# Patient Record
Sex: Female | Born: 1935 | Race: White | Hispanic: No | Smoking: Former smoker
Health system: Southern US, Community
[De-identification: ages and names within clinical notes are randomized; demographics above are authoritative.]

## PROBLEM LIST (undated history)

## (undated) DIAGNOSIS — T82330A Leakage of aortic (bifurcation) graft (replacement), initial encounter: Secondary | ICD-10-CM

## (undated) DIAGNOSIS — M1990 Unspecified osteoarthritis, unspecified site: Secondary | ICD-10-CM

## (undated) DIAGNOSIS — I714 Abdominal aortic aneurysm, without rupture, unspecified: Secondary | ICD-10-CM

## (undated) DIAGNOSIS — B029 Zoster without complications: Secondary | ICD-10-CM

## (undated) DIAGNOSIS — M353 Polymyalgia rheumatica: Secondary | ICD-10-CM

## (undated) DIAGNOSIS — E039 Hypothyroidism, unspecified: Secondary | ICD-10-CM

## (undated) DIAGNOSIS — I6529 Occlusion and stenosis of unspecified carotid artery: Secondary | ICD-10-CM

## (undated) DIAGNOSIS — N814 Uterovaginal prolapse, unspecified: Secondary | ICD-10-CM

## (undated) DIAGNOSIS — K642 Third degree hemorrhoids: Secondary | ICD-10-CM

## (undated) DIAGNOSIS — Z974 Presence of external hearing-aid: Secondary | ICD-10-CM

## (undated) DIAGNOSIS — N8110 Cystocele, unspecified: Secondary | ICD-10-CM

## (undated) DIAGNOSIS — D649 Anemia, unspecified: Secondary | ICD-10-CM

## (undated) DIAGNOSIS — I219 Acute myocardial infarction, unspecified: Secondary | ICD-10-CM

## (undated) HISTORY — PX: HEMORRHOID BANDING: SHX5850

## (undated) HISTORY — DX: Third degree hemorrhoids: K64.2

## (undated) HISTORY — PX: APPENDECTOMY: SHX54

## (undated) HISTORY — DX: Polymyalgia rheumatica: M35.3

## (undated) HISTORY — DX: Zoster without complications: B02.9

## (undated) HISTORY — DX: Cystocele, unspecified: N81.10

## (undated) HISTORY — PX: CARPAL TUNNEL RELEASE: SHX101

## (undated) HISTORY — DX: Unspecified osteoarthritis, unspecified site: M19.90

## (undated) HISTORY — DX: Acute myocardial infarction, unspecified: I21.9

## (undated) HISTORY — DX: Uterovaginal prolapse, unspecified: N81.4

## (undated) HISTORY — PX: CATARACT EXTRACTION: SUR2

## (undated) HISTORY — PX: TONSILLECTOMY: SUR1361

## (undated) HISTORY — DX: Hypothyroidism, unspecified: E03.9

## (undated) HISTORY — DX: Anemia, unspecified: D64.9

## (undated) HISTORY — DX: Abdominal aortic aneurysm, without rupture, unspecified: I71.40

## (undated) HISTORY — DX: Abdominal aortic aneurysm, without rupture: I71.4

## (undated) HISTORY — PX: CHOLECYSTECTOMY: SHX55

## (undated) HISTORY — DX: Occlusion and stenosis of unspecified carotid artery: I65.29

---

## 1980-08-22 DIAGNOSIS — I219 Acute myocardial infarction, unspecified: Secondary | ICD-10-CM

## 1980-08-22 HISTORY — DX: Acute myocardial infarction, unspecified: I21.9

## 2006-08-22 HISTORY — PX: ABDOMINAL AORTIC ANEURYSM REPAIR: SUR1152

## 2006-09-18 ENCOUNTER — Encounter: Admission: RE | Admit: 2006-09-18 | Discharge: 2006-09-18 | Payer: Self-pay

## 2006-10-06 ENCOUNTER — Ambulatory Visit: Payer: Self-pay | Admitting: Vascular Surgery

## 2006-10-11 ENCOUNTER — Inpatient Hospital Stay (HOSPITAL_COMMUNITY): Admission: RE | Admit: 2006-10-11 | Discharge: 2006-10-13 | Payer: Self-pay | Admitting: Vascular Surgery

## 2006-10-12 ENCOUNTER — Ambulatory Visit: Payer: Self-pay | Admitting: Vascular Surgery

## 2006-10-30 ENCOUNTER — Encounter: Admission: RE | Admit: 2006-10-30 | Discharge: 2006-10-30 | Payer: Self-pay | Admitting: Vascular Surgery

## 2006-10-30 ENCOUNTER — Ambulatory Visit: Payer: Self-pay | Admitting: Vascular Surgery

## 2006-12-07 ENCOUNTER — Ambulatory Visit: Payer: Self-pay | Admitting: Vascular Surgery

## 2006-12-07 ENCOUNTER — Encounter (INDEPENDENT_AMBULATORY_CARE_PROVIDER_SITE_OTHER): Payer: Self-pay | Admitting: *Deleted

## 2006-12-07 ENCOUNTER — Inpatient Hospital Stay (HOSPITAL_COMMUNITY): Admission: RE | Admit: 2006-12-07 | Discharge: 2006-12-09 | Payer: Self-pay | Admitting: Vascular Surgery

## 2006-12-07 HISTORY — PX: CAROTID ENDARTERECTOMY: SUR193

## 2006-12-25 ENCOUNTER — Ambulatory Visit: Payer: Self-pay | Admitting: Vascular Surgery

## 2007-05-25 ENCOUNTER — Encounter: Admission: RE | Admit: 2007-05-25 | Discharge: 2007-05-25 | Payer: Self-pay | Admitting: Vascular Surgery

## 2007-05-25 ENCOUNTER — Ambulatory Visit: Payer: Self-pay | Admitting: Vascular Surgery

## 2007-06-20 ENCOUNTER — Ambulatory Visit: Payer: Self-pay | Admitting: Ophthalmology

## 2007-08-06 ENCOUNTER — Ambulatory Visit: Payer: Self-pay | Admitting: Ophthalmology

## 2008-01-02 ENCOUNTER — Encounter: Admission: RE | Admit: 2008-01-02 | Discharge: 2008-01-02 | Payer: Self-pay | Admitting: Orthopedic Surgery

## 2008-01-03 ENCOUNTER — Ambulatory Visit (HOSPITAL_BASED_OUTPATIENT_CLINIC_OR_DEPARTMENT_OTHER): Admission: RE | Admit: 2008-01-03 | Discharge: 2008-01-03 | Payer: Self-pay | Admitting: Orthopedic Surgery

## 2008-01-18 ENCOUNTER — Ambulatory Visit: Payer: Self-pay | Admitting: Vascular Surgery

## 2008-07-25 ENCOUNTER — Ambulatory Visit: Payer: Self-pay | Admitting: Vascular Surgery

## 2008-08-29 ENCOUNTER — Ambulatory Visit: Payer: Self-pay | Admitting: Vascular Surgery

## 2008-08-29 ENCOUNTER — Encounter: Admission: RE | Admit: 2008-08-29 | Discharge: 2008-08-29 | Payer: Self-pay | Admitting: Vascular Surgery

## 2008-10-29 ENCOUNTER — Ambulatory Visit: Payer: Self-pay | Admitting: Vascular Surgery

## 2008-12-26 ENCOUNTER — Ambulatory Visit: Payer: Self-pay | Admitting: Vascular Surgery

## 2009-09-04 ENCOUNTER — Ambulatory Visit: Payer: Self-pay | Admitting: Vascular Surgery

## 2009-09-04 ENCOUNTER — Encounter: Admission: RE | Admit: 2009-09-04 | Discharge: 2009-09-04 | Payer: Self-pay | Admitting: Vascular Surgery

## 2010-01-08 ENCOUNTER — Ambulatory Visit: Payer: Self-pay | Admitting: Vascular Surgery

## 2010-09-07 ENCOUNTER — Ambulatory Visit: Admit: 2010-09-07 | Payer: Self-pay | Admitting: Vascular Surgery

## 2010-09-07 ENCOUNTER — Ambulatory Visit
Admission: RE | Admit: 2010-09-07 | Discharge: 2010-09-07 | Payer: Self-pay | Source: Home / Self Care | Attending: Vascular Surgery | Admitting: Vascular Surgery

## 2010-09-17 NOTE — Procedures (Unsigned)
VASCULAR LAB EXAM  INDICATION:  Follow up AAA endograft placed 10/11/2006.  HISTORY: Diabetes:  No. Cardiac:  CABG. Hypertension:  No.  EXAM:  AAA sac size 4.3 cm AP.  Transverse 4.2 cm.  Previous sac size was 4.2 cm by CT.  IMPRESSION:  The aorta and endograft appear patent.  No significant change in the size of the aneurysmal sac surrounding the endograft.  No evidence of an endoleak was detected.  ___________________________________________ Larina Earthly, M.D.  OD/MEDQ  D:  09/07/2010  T:  09/07/2010  Job:  [id number]

## 2010-09-17 NOTE — Procedures (Unsigned)
CAROTID DUPLEX EXAM  INDICATION:  Follow up carotid artery disease.  HISTORY: Diabetes:  No Cardiac:  CABG in 1982 Hypertension:  No Smoking:  No Previous Surgery:  Left carotid endarterectomy with a Dacron patch angioplasty 12/07/2006. CV History:  The patient is currently asymptomatic. Amaurosis Fugax No, Paresthesias No, Hemiparesis No                                      RIGHT             LEFT Brachial systolic pressure:         122               118 Brachial Doppler waveforms:         WNL               WNL Vertebral direction of flow:        antegrade         bi-directional DUPLEX VELOCITIES (cm/sec) CCA peak systolic                   76                85 ECA peak systolic                   78                75 ICA peak systolic                   131               92 ICA end diastolic                   41                31 PLAQUE MORPHOLOGY:                  heterogeneous PLAQUE AMOUNT:                      moderate PLAQUE LOCATION:                    ICA  IMPRESSION:  Right-sided 40% to 59% stenosis.  This could be secondary to vessel tortuosity.  A patent and durable left carotid endarterectomy with no hemodynamically-significant stenosis.  The left vertebral is bi- directional, suggestive of a partial subclavian steal.    ___________________________________________ Larina Earthly, M.D.  OD/MEDQ  D:  09/07/2010  T:  09/07/2010  Job:  [id number]

## 2011-01-04 NOTE — Assessment & Plan Note (Signed)
OFFICE VISIT   BLONDINA, CODERRE  DOB:  11/17/1934                                       05/25/2007  YNWGN#:[id number]   Patient presents today for followup of her left carotid endarterectomy  in April, 2008, and her aortic aneurysm stent grafting in February,  2008.  She reports no major new difficulties, specifically no cardiac  difficulties.  She has had no neurologic deficits.   PHYSICAL EXAMINATION:  She is a well-developed white female appearing  her stated age of 75.  Blood pressure is 142/85.  Pulse 83, respirations  18.  Her left carotid incision has somewhat hypertrophic scars.  She has  no bruits bilaterally.  Her abdominal exam reveals mild obesity with no  palpable aneurysm.  She has 2+ femoral pulses bilaterally.   She underwent repeat duplex today, and this reveals no evidence of  stenosis in the left or right carotid system.   She also underwent a CT scan of her abdomen today for followup of her  stent graft.  This shows excellent positioning of her stent graft with  no evidence of endo leak and actually has had a shrinking of her  aneurysm sac from 5.5 cm down to 4.8 cm.   I am quite pleased with her early results.  She does have more than the  usual amount of discomfort around the area of her left neck incision and  will continue to treat this  symptomatically with Motrin.  I plan to see her again in one year with  repeat carotid duplex and repeat CT scan of her stent graft repair.   Larina Earthly, M.D.  Electronically Signed   TFE/MEDQ  D:  05/25/2007  T:  05/28/2007  Job:  [id number]   cc:   Wallace Cullens

## 2011-01-04 NOTE — Assessment & Plan Note (Signed)
OFFICE VISIT   Margaret, Castillo  DOB:  20-Jun-1935                                       08/29/2008  ZOXWR#:[id number]   Patient presents today for followup of her stent graft repair of  abdominal aortic aneurysm and also a followup of her carotid disease.  She continues to do quite well with no new major medical difficulties.  Specifically, she denies any cardiac problems.  She does have  difficulties with elevated cholesterol, hypothyroidism, and polymyalgia  rheumatica.  She has had a myocardial infarction in 1982.  She is  married and is retired.  She does not smoke or drink alcohol.   MEDICATION ALLERGIES:  Penicillin, codeine.   CURRENT MEDICATIONS:  1. Synthroid.  2. Prednisone.  3. Vytorin.  4. Tricor.   PHYSICAL EXAMINATION:  She is a moderately obese white female in no  acute distress.  Blood pressure 138/82, pulse 99, respirations 18.  Her  radial pulses are 2+ bilaterally.  She does have 2+ posterior tibial  pulses.  Abdominal exam reveals obesity.  I do not show any aneurysms.   She underwent a CAT scan today.  I reviewed this with her.  This shows  excellent positioning of her stent graft with no evidence of shifting.  She actually is shrinking in the size of her overall aneurysm sac to  just less than 4 cm.  I am quite pleased with her overall result, as is  patient.  We will see her again in 1 year with repeat CT of her abdomen  for followup of her stent graft.   She has also done well from the standpoint of her endarterectomy on the  left.  She does continue to have some soreness at the level of the  incision but does not have any bruits and is scheduled for routine  follow-up ultrasound in our office.  She will notify us should she  develop any new difficulty.  We will see her again with the ultrasound  carotid and with CT in one year.   Larina Earthly, M.D.  Electronically Signed   TFE/MEDQ  D:  08/29/2008  T:  09/01/2008  Job:   [id number]   cc:   Wallace Cullens

## 2011-01-04 NOTE — Procedures (Signed)
CAROTID DUPLEX EXAM   INDICATION:  Followup evaluation of known carotid artery disease.   HISTORY:  Diabetes:  No.  Cardiac:  History of MI.  Hypertension:  No.  Smoking:  Patient is a former smoker.  Previous Surgery:  Left carotid endarterectomy with Dacron patch  angioplasty by Dr. Arbie Cookey on 12/07/2006.  Stent graft repair of abdominal  aortic aneurysm on 10/11/2006.  CV History:  Patient reports no cerebrovascular symptoms at this time.  Previous duplex performed on 05/25/2007 revealed a 20 to 39% right ICA  stenosis and a 40% to 59% left ICA stenosis.  Amaurosis Fugax No, Paresthesias No, Hemiparesis No                                       RIGHT             LEFT  Brachial systolic pressure:         132               138  Brachial Doppler waveforms:         Triphasic         Triphasic  Vertebral direction of flow:        Antegrade         Antegrade  DUPLEX VELOCITIES (cm/sec)  CCA peak systolic                   91                121  ECA peak systolic                   124               156  ICA peak systolic                   81                136  ICA end diastolic                   30                28  PLAQUE MORPHOLOGY:                  Soft              Soft  PLAQUE AMOUNT:                      Mild to moderate  Mild  PLAQUE LOCATION:                    Proximal ICA      Proximal ICA   IMPRESSION:  1. Right ICA stenosis 20% to 39%.  2. Left ICA stenosis 40% to 59% status post endarterectomy.  3. No significant change from previous study performed 05/25/2007.   ___________________________________________  Larina Earthly, M.D.   MC/MEDQ  D:  01/18/2008  T:  01/18/2008  Job:  [id number] [id number]

## 2011-01-04 NOTE — Procedures (Signed)
CAROTID DUPLEX EXAM   INDICATION:  Follow up carotid disease.   HISTORY:  Diabetes:  No  Cardiac:  CABG in 1982  Hypertension:  No  Smoking:  No  Previous Surgery:  Left carotid endarterectomy with DPA on 12/07/2006 by  Dr. Arbie Cookey.  AAA graft repair 10/11/2006.  CV History:  Asymptomatic.  Amaurosis Fugax No, Paresthesias No, Hemiparesis No                                       RIGHT             LEFT  Brachial systolic pressure:         120               118  Brachial Doppler waveforms:         triphasic         triphasic  Vertebral direction of flow:        antegrade         antegrade/abnormal  DUPLEX VELOCITIES (cm/sec)  CCA peak systolic                   128               132  ECA peak systolic                   150               164  ICA peak systolic                   126               184 in the distal  ICA end diastolic                   39                66  PLAQUE MORPHOLOGY:                  heterogeneous     homogeneous  PLAQUE AMOUNT:                      mild to moderate  mild to moderate  PLAQUE LOCATION:                    bifurcation and ICA                 ICA   IMPRESSION:  1. Right internal carotid artery Doppler velocities show evidence of      40%-59% stenosis.  2. Left internal carotid artery velocities show evidence of 40%-59%      stenosis; however, Doppler velocities may be higher due to tortuous      vessel.  3. Bilateral vertebral arteries are both antegrade; however, left      shows evidence of early systolic deceleration.   ___________________________________________  Larina Earthly, M.D.   NT/MEDQ  D:  01/08/2010  T:  01/08/2010  Job:  [id number]

## 2011-01-04 NOTE — Procedures (Signed)
CAROTID DUPLEX EXAM   INDICATION:  Followup carotid artery disease.   HISTORY:  Diabetes:  No.  Cardiac:  MI.  Hypertension:  No.  Smoking:  Previous.  Previous Surgery:  Left CEA with DPA 12/07/2006 by Dr. Arbie Cookey.  AAA endo  graft repair 10/11/2006.  CV History:  No.  Amaurosis Fugax No, Paresthesias No, Hemiparesis No                                       RIGHT             LEFT  Brachial systolic pressure:         114               116  Brachial Doppler waveforms:         WNL               WNL  Vertebral direction of flow:        Antegrade         Antegrade -  abnormal  DUPLEX VELOCITIES (cm/sec)  CCA peak systolic                   114               118  ECA peak systolic                   116               146  ICA peak systolic                   99                118  ICA end diastolic                   31                24  PLAQUE MORPHOLOGY:                  Heterogeneous     Homogenous  PLAQUE AMOUNT:                      Mild              Mild/moderate  PLAQUE LOCATION:                    bifurcation/ICA   ICA   IMPRESSION:  1. Right ICA shows evidence of 20-39% stenosis.  2. Left ICA shows evidence of 40-59% stenosis by velocities (low end      of range).  3. Bilateral vertebral arteries appear antegrade, however, left shows      evidence of early systolic deceleration.   ___________________________________________  Larina Earthly, M.D.   AS/MEDQ  D:  12/26/2008  T:  12/26/2008  Job:  [id number]

## 2011-01-04 NOTE — Procedures (Signed)
CAROTID DUPLEX EXAM   INDICATION:  Follow-up evaluation of known carotid artery disease.   HISTORY:  Diabetes:  No.  Cardiac:  MI.  Hypertension:  No.  Smoking:  Patient smoked for 20 years but not currently.  Previous Surgery:  Left carotid artery disease with Dacron patch  angioplasty by Dr. Arbie Cookey on December 07, 2006.  Abdominal aortic aneurysm  repair with a stent graft on October 11, 2006 by Dr. Arbie Cookey.  CV History:  Patient reports no cerebrovascular symptoms at this time.  Amaurosis Fugax No, Paresthesias No, Hemiparesis No                                       RIGHT             LEFT  Brachial systolic pressure:         128               128  Brachial Doppler waveforms:         Triphasic         Triphasic  Vertebral direction of flow:        Antegrade         Antegrade  DUPLEX VELOCITIES (cm/sec)  CCA peak systolic                   94                83  ECA peak systolic                   137               131  ICA peak systolic                   89                114  ICA end diastolic                   32                43  PLAQUE MORPHOLOGY:                  Calcified         Soft  PLAQUE AMOUNT:                      Mild              Mild  PLAQUE LOCATION:                    Proximal ICA      Proximal and  distal to the endarterectomy site   IMPRESSION:  1. A 20-39% right internal carotid artery stenosis.  2. A 40-59% distal internal carotid artery stenosis, status post      endarterectomy.   ___________________________________________  Larina Earthly, M.D.   MC/MEDQ  D:  05/25/2007  T:  05/26/2007  Job:  [id number]

## 2011-01-04 NOTE — Assessment & Plan Note (Signed)
OFFICE VISIT   Margaret Castillo, Margaret Castillo  DOB:  1935-05-13                                       09/04/2009  DGLOV#:[id number]   The patient presents today for followup of her infrarenal aneurysm stent  graft repair with a Gore excluder graft on 10/11/2006.  She was also had  a left carotid endarterectomy on 12/07/2006.  She continues to be quite  active and has no major medical difficulties.  Does have history of  prior myocardial infarction, hypothyroidism and polymyalgia rheumatica.  She does not have any neurologic deficits.   SOCIAL HISTORY:  She is married, retired, with 2 children.  She does not  smoke, having quit in 1982.  She does not drink alcohol on a regular  basis.   PHYSICAL EXAMINATION:  A well-developed, well-nourished white female  appearing stated age.  Blood pressure is 128/81, pulse 84, respirations  18.  Carotid arteries reveal a well-healed left carotid incision with no  masses.  She is grossly intact.  HEENT is normal.  Chest is clear  bilaterally without wheezing.  Abdomen is soft and nontender and no  palpable aneurysm.  Musculoskeletal shows no major deformities or  cyanosis.  Neurologic:  No weakness or paresthesias.  Skin:  Without  rashes or ulceration.   She underwent a CT angiogram today for followup of her stent graft.  This shows continued decrease in size from a maximum of 5.5 down to 4.2  cm.  Her stent graft is well-positioned.   I have reassured the patient with this.  We plan to see her again in 1  year with a carotid ultrasound and ultrasound of her aortic aneurysm  stent graft repair.     Larina Earthly, M.D.  Electronically Signed   TFE/MEDQ  D:  09/04/2009  T:  09/07/2009  Job:  [id number]   cc:   Wallace Cullens

## 2011-01-04 NOTE — Op Note (Signed)
[name], Margaret Castillo               ACCOUNT NO.:  [id number]   MEDICAL RECORD NO.:  [id number]          PATIENT TYPE:  AMB   LOCATION:  DSC                          FACILITY:  [facility]   PHYSICIAN:  Cindee Salt, M.D.       DATE OF BIRTH:  [birth year]   DATE OF PROCEDURE:  01/03/2008  DATE OF DISCHARGE:                               OPERATIVE REPORT   PREOPERATIVE DIAGNOSES:  1. Carpal tunnel syndrome, left hand.  2. Stenosing tenosynovitis, left thumb.   POSTOPERATIVE DIAGNOSES:  1. Carpal tunnel syndrome, left hand.  2. Stenosing tenosynovitis, left thumb.   OPERATIONS:  1. Decompression left median nerve.  2. Release of the A1 pulley, left thumb.   ASSISTANT:  Carolyne Fiscal, RN.   ANESTHESIA:  General.   ANESTHESIOLOGIST:  Janetta Hora. Gelene Mink, M.D.   HISTORY:  The patient is a 76 year old female with a history of carpal  tunnel syndrome, triggering of the left thumb, neither of these have  responded to conservative treatment.  She has a history of thyroid  problems and is on prednisone.  She is aware of risks and complications  including infection recurrence; injury to arteries, nerves, or tendons;  incomplete relief of symptoms, and dystrophy.  Questions have been  encouraged and answered.  She has elected to proceed with decompression.  She is seen in the preoperative area.  The patient again was encouraged  to ask questions and these were answered to her satisfaction.  The  extremity was marked by both the patient and the surgeon.  Antibiotic  given.   PROCEDURE:  The patient was brought to the operating room where a  general anesthetic was carried out without difficulty.  She was prepped  using DuraPrep in supine position with the left arm free.  After time-  out was performed, a longitudinal incision was made in the palm and  carried down through the subcutaneous tissue.  Bleeders were  electrocauterized.  Palmar fascia was split.  Superficial palmar arch  was identified.   Flexor tendon, ring, little finger identified to the  ulnar side of median nerve and carpal retinaculum was incised with sharp  dissection.  A right-angle and Sewall retractor were placed between skin  and forearm fascia.  The fascia was released for approximately a  centimeter and a half proximal to the wrist crease under direct vision.  Canal was explored.  The compression of the nerve was apparent with an  hourglass deformity.  The wound was irrigated.  The skin closed with  interrupted 5-0 Vicryl Rapide sutures.  A separate incision was then  made transversely over the metacarpophalangeal joint of the thumb and  carried down to subcutaneous tissue.  The neurovascular structures were  identified, protected, and retractors placed.  A1 pulley was found to be  markedly thickened with a large nodule present just proximal to the FPL  tendon.  A release was then performed on the radial side protecting the  oblique ligament.  The thumb displayed full range of motion.  No further  trigger was noted.  The wound was irrigated.  The skin closed  with  interrupted 5-0 Vicryl Rapide sutures.  Sterile compressive dressing  with splint to the wrist applied with fingers and thumb free.  The patient tolerated the procedure well and was taken to the recovery  room for observation in satisfactory condition.  Deflation of the  tourniquet was resumed immediately.  She will be discharged home to  return to the [facility] [place].           ______________________________  Cindee Salt, M.D.     GK/MEDQ  D:  01/03/2008  T:  01/04/2008  Job:  [id number]   cc:   Wallace Cullens

## 2011-01-04 NOTE — Assessment & Plan Note (Signed)
OFFICE VISIT   Margaret Castillo, Margaret Castillo  DOB:  21-Jun-1935                                       10/29/2008  ZOXWR#:[id number]   The patient presents today for continued followup regarding her  abdominal aortic aneurysm repair with a stent graft.  She had this done  in February of 2008 and I had recently seen her in January with CT  followup showing continued shrinkage in the size of her infrarenal  aneurysm and good positioning of her stent graft.  She did have a small  type 2 endoleak at the distal portion which we had discussed at that  time.  She had flank discomfort.  She thought she may have had recurrent  kidney stone and presented to [facility] emergency department on  03/08.  Due to the history of her stent graft she underwent a CT scan at  that time.  Apparently it was quite very busy in the emergency  department and she was there from early evening well through the early  morning hours of the next day.  There was some discussion regarding the  stent graft and therefore she had the CT scan.  She was told that this  showed an area of concern and on the dictated report of this she was  felt to have a potential type 2 endoleak and also the dictated report  says that her aneurysm size was 4.2 x 4.5 x 9.8.  Apparently the  resident in the emergency department discussed this with the vascular  surgeon and was told to see me.  Her pain is subsequently completely  resolved.  I do have her actual CT scan on disk and I have compared this  with her January of 2010 study.  This shows no change whatsoever.  Her  maximal diameter continues to be small at just over 4 cm and she does  continue to have a small probable type 2 endoleak at the level of the  iliac bifurcation.  I discussed this with the patient and her sister and  explained that there is no concern regarding this and that we will  continue her followup CT scan as described and that they may have been  talking about length but certainly she does not have any increase in  size in her native aortic sac.  We will see her again in 1 year.  She  knows to present immediately should she have any difficulties.   Larina Earthly, M.D.  Electronically Signed   TFE/MEDQ  D:  10/29/2008  T:  10/30/2008  Job:  [id number]   cc:   Wallace Cullens

## 2011-01-07 NOTE — Op Note (Signed)
Margaret Castillo, Margaret Castillo               ACCOUNT NO.:  [id number]   MEDICAL RECORD NO.:  [id number]          PATIENT TYPE:  INP   LOCATION:  3316                         FACILITY:  [facility]   PHYSICIAN:  Quita Skye. Hart Rochester, M.D.  DATE OF BIRTH:  06-11-1935   DATE OF PROCEDURE:  10/11/2006  DATE OF DISCHARGE:                               OPERATIVE REPORT   PREOPERATIVE DIAGNOSIS:  Infrarenal abdominal aortic aneurysm.   PROCEDURE:  Insertion of an aortic stent graft which is performed by Dr.  Arbie Cookey.  I am dictating the left femoral cutdown, so the procedure for  this dictation is a left femoral artery cutdown exposure for insertion  of aortic stent graft.   PROCEDURE:  The patient was taken to the operating room, placed in  supine position at which time the abdomen and groins were prepped with  Betadine scrub solution and draped in routine sterile manner.  Dr.  Hart Rochester exposed the left femoral vessels, Dr. Arbie Cookey exposed the right  femoral vessels.  I made an oblique incision just above the inguinal  crease, carried down through subcutaneous tissue.  The femoral vessels  were exposed from the inguinal ligament down to the origin of both  superficial femoral and profunda femoris arteries.  There was posterior  plaque present but there was an excellent pulse anteriorly with a soft  vessel.  Following completion of the insertion of the aortic stent  graft, to be dictated by Dr. Arbie Cookey, the femoral artery was repaired with  two continuous 6-0 Prolene sutures after appropriate back bleeding.  Adequate hemostasis was achieved.  The wound was closed with Vicryl in  subcuticular fashion with Steri-Strips.  Sterile dressing applied.  The  patient taken to recovery in satisfactory condition.           ______________________________  Quita Skye Hart Rochester, M.D.     JDL/MEDQ  D:  10/11/2006  T:  10/12/2006  Job:  [id number]

## 2011-01-07 NOTE — H&P (Signed)
NAME:  Margaret Castillo, Margaret Castillo               ACCOUNT NO.:  [id number]   MEDICAL RECORD NO.:  [id number]          PATIENT TYPE:  INP   LOCATION:  NA                           FACILITY:  [facility]   PHYSICIAN:  Larina Earthly, M.D.    DATE OF BIRTH:  Aug 19, 1935   DATE OF ADMISSION:  12/07/2006  DATE OF DISCHARGE:                              HISTORY & PHYSICAL   PRIMARY CARE PHYSICIAN:  Wallace Cullens, M.D.   CHIEF COMPLAINT:  Left carotid stenosis.   HISTORY OF PRESENT ILLNESS:  Margaret Castillo is a 76 year old Caucasian  female status post abdominal aortic aneurysm repair using a Gore  Excluder aortic stent graft on October 11, 2006, by Dr. Gretta Began.  She did well postoperatively and was discharged home on postoperative  day #2.  She had a followup CT scan on March 10 showing excellent  position of the stent graft and no evidence of leak.  She is at her  usual level of activity with scheduled 59-month followup.  However, she  was underwent preoperative Doppler evaluation preceding her aortic  surgery and was found to have severe asymptomatic left internal carotid  artery stenosis at 80-99%.  There was only there was only 20-39% on the  right.  Based on the severity of her stenosis, Dr. Arbie Cookey recommended  elective repair to reduce her risk for future stroke.  After discussing  risks and benefits, she agreed to proceed.  However, she chose to  postpone surgery by approximately 1 month as her husband was undergoing  prostate surgery in late March.  Today, December 05, 2006,  she is at our  office for preoperative History and Physical.  She continues to be  asymptomatic.  Specifically denied headache, dizziness, seizures,  numbness, muscle weakness, dysarthria, dysphagia, visual changes or  amaurosis fugax.  She does have mild symmetrical tingling in her feet  which has been present for several months, and she has had normal lower  extremity Doppler studies.  She has also the had syncope in the past  after  an episode of vomiting when she was sick with viral  gastroenteritis.  This was over 1 year ago.  She has had no prior  history of TIA or stroke.   PAST MEDICAL HISTORY:  1. Abdominal aortic aneurysm status post stent graft repair.  2. Hypertension.  3. Coronary artery disease status post myocardial infarction in 1982      (normal adenosine  stress test around February of this year).  4. Hyperlipidemia.  5. Hypothyroidism.  6. Polymyalgia rheumatica.  7. Nephrolithiasis.  8. Hemorrhoids with negative colonoscopy 2 years ago.   PAST SURGICAL HISTORY:  1. Open cholecystectomy.  2. Tonsillectomy.  3. Exploratory laparotomy for fertility workup.  4. History of cystoscopy.  5. History of D&C status post miscarriage.   ALLERGIES:  1. PENICILLIN causes a rash.  2. CODEINE causes nausea or vomiting.  3. She is also intolerant to several STATINS.   MEDICATIONS:  1. Synthroid 112 mcg daily.  2. Prednisone 1 mg daily.  3. Vytorin 10/80 mg daily.  4. TriCor 140 mg daily.  5. Fosamax 70 mg weekly.  6. Calcium supplement daily.   REVIEW OF SYSTEMS:  See HPI for pertinent positives and negatives. In  addition, she reports mild dyspnea on exertion but otherwise no  shortness of breath.  She denies chest pain.  She has occasional leg  cramps at night and occasional rectal bleeding secondary to hemorrhoids.   SOCIAL HISTORY:  She is married with two children, one biological and  one adopted child.  She quit smoking in 1982.  She does not drink  alcohol. She works as a Museum/gallery exhibitions officer at [employer].  She [place]   FAMILY HISTORY:  Mother is deceased, 25, with a history of  cerebrovascular accident.  Father deceased at 50 with history of  myocardial infarction.   PHYSICAL EXAMINATION:  VITAL SIGNS:  Blood pressure 112/70 in her left  arm, heart rate 76, respirations 20.  GENERAL APPEARANCE:  This 76 year old pleasant Caucasian  female who is  alert, cooperative in no acute distress.  HEENT: Head is normocephalic, atraumatic.  She does wear glasses.  Pupils are equal and reactive to light accommodation.  Sclera  nonicteric. Oral mucosa is pink and moist. No lesions were noted  NECK:  Her neck is supple with palpable carotid pulses.  She does have a  harsh bruit on the left.  No lymphadenopathy was appreciated.  RESPIRATORY:  Lung sounds are clear, symmetrical on inspiration, and  breathing was unlabored  CARDIAC:  Heart has a regular rate and rhythm.  No murmur, rub or gallop  was noted.  ABDOMEN:  Abdomen is soft, nontender, nondistended.  No hepatomegaly was  appreciated.  Abdomen is a some somewhat obese.  She does have a well-  healed right upper quadrant scar.  Bowel sounds normoactive.  GU/RECTAL:  These exams were deferred.  EXTREMITIES: Extremities are warm and dry without pitting edema.  No  ulcerations were noted.  She has 2+ radial, dorsalis pedis and posterior  tibial pulses.  Her bilateral groin incisions appear to be healing well  without signs of infection.  NEUROLOGIC:  Neurologic exam is grossly intact.  She is alert and  oriented x4.  Speech is clear.  Gait is steady.  Muscle strength is 5/5  in upper and lower extremities.  Deep tendon reflexes were about 2+.   ASSESSMENT:  Severe left internal carotid artery stenosis, asymptomatic.   PLAN:  She will be electively admitted to [facility] on December 07, 2006, to undergo a left carotid endarterectomy by Dr. Gretta Began.      Jerold Coombe, P.A.      Larina Earthly, M.D.  Electronically Signed    AWZ/MEDQ  D:  12/05/2006  T:  12/05/2006  Job:  [id number]   cc:   Wallace Cullens

## 2011-01-07 NOTE — Op Note (Signed)
NAMESHOSHANAH, DAPPER               ACCOUNT NO.:  [id number]   MEDICAL RECORD NO.:  [id number]          PATIENT TYPE:  INP   LOCATION:  3316                         FACILITY:  [facility]   PHYSICIAN:  Larina Earthly, M.D.    DATE OF BIRTH:  06-30-1935   DATE OF PROCEDURE:  10/11/2006  DATE OF DISCHARGE:                               OPERATIVE REPORT   PREOPERATIVE DIAGNOSIS:  Infrarenal abdominal aortic aneurysm.   POSTOPERATIVE DIAGNOSIS:  Infrarenal abdominal aortic aneurysm.   PROCEDURE:  Stent graft, Gore Excluder repair of infrarenal abdominal  aortic aneurysm.   SURGEON:  Larina Earthly, M.D.   ASSESSMENT:  Quita Skye. Hart Rochester, M.D.   ANESTHESIA:  General endotracheal.   COMPLICATIONS:  None.   DISPOSITION:  Currently stable.   PROCEDURE IN DETAIL:  The patient was taken to the operating room and  placed solution over the area of both groins and abdomen were prepped  and draped in the usual sterile fashion.  Left femoral artery cut down  and exposure will be dictated in a separate by Dr. Hart Rochester.  The femoral  artery was exposed with an oblique incision above the inguinal ligament.  The common and femoral arteries were encircled with vessel loops  bilaterally.  Next using a Seldinger technique and an 18-gauge needle, a  guidewire was passed up to the level of the suprarenal through the right  common femoral artery and a 8-French sheath was passed over the  guidewire.  A pigtail catheter was positioned at the level of suprarenal  aorta and an AP arteriogram was obtained to show the level of the renal  arteries and the hypogastric arteries as well.  A 23-mm x 12-mm x 16-cm  length stent graft was chosen for the main body to be placed to the  right groin.  The Amplatz Super Stiff wire was passed through the  pigtail catheter and the pigtail catheter was removed.  An 18-French  sheath was passed through the right femoral artery for positioning of  the ipsilateral limb.  Next using a  Seldinger technique, a guidewire was  passed up through the left femoral artery and an Amplatz Super Stiff  wire was passed above the level of the renal arteries.  A 12-French  sheath was passed over the guidewire on the left and the pigtail  catheter was positioned to the level of the renal arteries.  The main  body, 23-mm x 12-mm x 16-cm length device was passed up through the  right 18-French sheath and was positioned at the level of the lowest  renal artery which was an accessory renal artery on the right.  The  configuration was such that the limbs would be slightly crossed with the  contra-lateral gait arising anteriorly and slightly to the right.  The  main body of the stent graft was deployed without any difficulty.  Next  the CODA balloon was used to gently dilate the proximal stent graft at  the level of the renal arteries.  Next the pigtail catheter was  withdrawn down into the sac and the left limb gait was  easily cannulated  and the pigtail catheter was positioned at this level and determination  of landing length was noted for the clearance of the left hypogastric  artery.  A 12-mm x 14-cm length left contra-lateral limb was positioned  and deployed without difficulty.  CODA balloon was used to dilate the  junction and also to gently dilate the distal attachment sites.  The  pigtail catheter was then positioned at the level of the suprarenal  aorta and repeat arteriogram was obtained.  This did show a blush on the  right lateral wall of the stent graft, appeared to be a lumbar at this  level, but also I was not convinced that there was not a Type I leak  around the stent graft.  For this reason several additional inflations  and several additional arteriograms were obtained.  This did show a  slight late blush which was felt to be secondary to lumbar collaterals.  There was no major endo leak.  The pigtail catheter and sheaths were  removed and the common and femoral arteries  were occluded with Heaney  clamps.  It should be noted the patient was given 6000 units prior to  any occlusion of any vessels. The femoral arteries were closed  bilaterally with 5-0 and 6-0 Prolene sutures.  Clamps were removed and  excellent flow was noted below the level of the arteriotomy bilaterally.  The patient was given 50 mg of Protamine diverse heparin.  Wounds were  irrigated with saline.  Excess cautery wounds were closed with several  layers of 2-0 Vicryl in the subcutaneous tissue and the skin was closed  with 3-0 subcuticular Vicryl stitch.  Sterile dressing was applied.  The  patient was taken to the recovery room with palpable posterior pulses  bilaterally.      Larina Earthly, M.D.  Electronically Signed     TFE/MEDQ  D:  10/11/2006  T:  10/12/2006  Job:  [id number]   cc:   Wallace Cullens

## 2011-01-07 NOTE — Op Note (Signed)
Margaret Castillo, Margaret Castillo               ACCOUNT NO.:  [id number]   MEDICAL RECORD NO.:  [id number]          PATIENT TYPE:  INP   LOCATION:  2550                         FACILITY:  [facility]   PHYSICIAN:  Larina Earthly, M.D.    DATE OF BIRTH:  11-27-1934   DATE OF PROCEDURE:  12/07/2006  DATE OF DISCHARGE:                               OPERATIVE REPORT   PREOPERATIVE DIAGNOSIS:  Severe asymptomatic left internal carotid  stenosis.   POSTOPERATIVE DIAGNOSIS:  Severe asymptomatic left internal carotid  stenosis.   PROCEDURE:  Left carotid endarterectomy and Dacron patch angioplasty.   SURGEON:  Larina Earthly, M.D.   ASSISTANT:  Constance Holster, PA-C.   ANESTHESIA:  General endotracheal.   COMPLICATIONS:  None.   DISPOSITION:  To recovery room stable, neurologically intact.   PROCEDURE IN DETAIL:  The patient was taken to the operating room and  placed in the supine position, where the area of the left neck was  prepped and draped and a sterile fashion.  Incision made anterior to the  sternocleidomastoid carried down through the platysma with  electrocautery.  The sternocleidomastoid was reflected posteriorly and  the carotid sheath was opened.  The facial vein was ligated with 2-0  silk ties and divided.  The sternocleidomastoid muscle was reflected  posteriorly and the carotid sheath was opened.  The common carotid  artery was encircled with an umbilical tape Rumel tourniquet.  The  inferior thyroid artery came off the bulb and was ligated with 3-0 silk  ties and divided.  The external carotid was encircled with a vessel  loop.  The internal carotid was circled with an umbilical tape Rumel  tourniquet.  Hypoglossal and vagus nerves were identified and preserved.  The patient was given 8000 units of intravenous heparin.  After  circulation time the internal, external and common carotid arteries were  occluded.  The common carotid artery was opened with an 11 blade,  extended  longitudinally with Potts scissors through the plaque onto the  internal carotid with Potts scissors.  A 10 shunt was passed up the  internal carotid artery, allowed to back bleed, then the common carotid,  where it was secured with Rumel tourniquets.  Endarterectomy was begun  on the common carotid artery and the plaque was divided proximally with  Potts scissors.  The endarterectomy was carried on to the bifurcation.  The external carotid was endarterectomized by eversion technique and the  internal carotid was endarterectomized in open fashion.  Remaining  atheromatous debris was removed from the endarterectomy plane.  A  Finesse Hemashield Dacron patch was brought onto the field and sewn as a  patch angioplasty with a running 6-0 Prolene suture.  Prior to  completion of the anastomosis, the shunt was removed and the usual  flushing maneuvers were undertaken.  The anastomosis was completed and  the external, followed by the common, and finally the internal carotid  occlusion clamps were removed.  Excellent flow characteristics were  noted with handheld Doppler in the internal and external carotid  arteries.  The patient was given 50 mg of  protamine to reverse the  heparin.  Wounds were irrigated with saline.  Hemostasis with  electrocautery.  The wounds were closed with 3-0 Vicryl in the subcutaneous and  subcuticular, the platysma was closed with a running 3-0 Vicryl suture  and the skin was closed with a 4-0 subcuticular Vicryl stitch.  A  sterile dressing was applied.  The patient was taken to the recovery  room in stable condition.      Larina Earthly, M.D.  Electronically Signed     TFE/MEDQ  D:  12/07/2006  T:  12/07/2006  Job:  [id number]   cc:   Wallace Cullens

## 2011-01-07 NOTE — H&P (Signed)
Margaret Castillo, Margaret Castillo               ACCOUNT NO.:  [id number]   MEDICAL RECORD NO.:  [id number]          PATIENT TYPE:  INP   LOCATION:  NA                           FACILITY:  [facility]   PHYSICIAN:  Larina Earthly, M.D.    DATE OF BIRTH:  [birth year]   DATE OF ADMISSION:  10/11/2006  DATE OF DISCHARGE:                              HISTORY & PHYSICAL   CHIEF COMPLAINT:  Abdominal aortic aneurysm.   HISTORY AND PHYSICAL:  This is a 76 year old Caucasian female with a  known abdominal aortic aneurysm for the past 2-3 years.  She has been  followed closely by Dr. Omer Jack.  Recently on followup with Dr.  Omer Jack, they found her aneurysm had grown a few millimeters, and she  was referred to Dr. Arbie Cookey.  The patient first noticed the aneurysm  during an abdominal series while she had the GI flu a few years back.  She then had an ultrasound which confirmed an aneurysm.  Ultrasound  recently showed a several millimeter increase in size of the abdominal  aortic aneurysm compared with prior exam.  The study was a max diameter  of 4.9 cm.  The length of the aneurysm has also slightly increased.  Right kidney measures 10.2 cm and the left measures 11.2.  Both celiac  arteries measure approximately 1 cm.   She saw Dr. Arbie Cookey on September 18, 2006, and she had a CT angio of the  abdomen and pelvis.  This showed greatest diameter of the aneurysm 5.4  cm and total length 12.9 cm.  It is infrarenal.  Atherosclerotic  involvement of proximal celiac axis and superior mesenteric artery.  Imaging features at the celiac axis origin the presence of a  hemodynamically significant stenosis.  No acute findings in the anatomic  pelvis on CT angiography of the pelvis.  This did show that the patient  is a candidate for stent graft repair.   The patient was set up for cardiac clearance by Dr. Timmie Foerster at  [facility].  She had an adenosine stress test which was normal.   The patient remains asymptomatic with  this aneurysm.  She denies  abdominal pain, nausea, vomiting, constipation, hematochezia,  hematemesis, back pain, claudication symptoms, peripheral edema,  dysuria, hematuria, reflux symptoms, angina, palpitations, history of  arrhythmias and TIA symptoms.   PAST MEDICAL HISTORY:  1. Hypertension.  2. Coronary artery disease status post MI in 1982.  3. Hyperlipidemia.  4. Hypothyroidism.  5. Polymyalgia rheumatica.   PAST SURGICAL HISTORY:  1. Cholecystectomy.  2. Tonsillectomy.  3. Exploratory laparotomy.  4. D&C status post miscarriage.   ALLERGIES:  1. PENICILLIN.  2. CODEINE.   MEDICATIONS:  1. Prednisone 1 mg p.o. daily.  2. Prednisone 5 mg p.o. daily.  3. Synthroid 112 mcg p.o. daily.  4. Vytorin 10/80 mg p.o. daily.  5. Tricor 45 mg p.o. daily.  6. Fosamax 70 mg p.o. weekly.  7. Aspirin 81 mg p.o. daily.  8. Calcium daily.   REVIEW OF SYSTEMS:  See HPI for pertinent positives and negatives;  otherwise, positive for joint pain.  She is negative for diabetes  mellitus, cerebrovascular accident, and acute renal insufficiency.   SOCIAL HISTORY:  The patient is married and has 2 children.  She has 1  biological child and 1 adopted.  She lives with her family.  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  She does still  drive.  She denies alcohol or tobacco abuse.   FAMILY HISTORY:  Mother deceased at 65, cerebrovascular accident.  Father deceased at 61 due to myocardial infarction.   PHYSICAL EXAMINATION:  VITALS:  Blood pressure 130/80, heart rate 72,  respirations 16.  GENERAL:  A 76 year old Caucasian female in no acute distress.  HEAD, EYES, EARS, NOSE, THROAT:  Normocephalic, atraumatic.  Pupils are  equal, round, react to light and accommodation.  Extraocular movements  intact.  Oral mucosa pink.  Dentures partial, upper and lower, which she  does not usually wear.  NECK:  Supple.  RESPIRATORY:  Symmetrical inspiration, unlabored, and  clear to  auscultation bilaterally.  CARDIAC:  Regular rate and rhythm.  ABDOMEN:  Soft, nontender, nondistended.  Normoactive bowel sounds x4.  GU/RECTAL:  Deferred.  EXTREMITIES:  No lower extremity edema.  Pulses show 2+ radial, femoral  and popliteal bilaterally.  She does not have a dorsalis pedis or  posterior tibial pulses palpable.  NEUROLOGIC:  Nonfocal, alert and oriented, gait steady.  Muscle strength  5+ bilaterally throughout.  Deep tendon reflexes 2+ and symmetrical.   ASSESSMENT:  Infrarenal abdominal aortic aneurysm, currently  asymptomatic.   PLAN:  1. Admit the patient to [facility] on October 11, 2006,      under Dr. Bosie Helper service.  2. The patient will undergo a Gore endovascular stent graft repair of      her abdominal aortic aneurysm.  3. The patient will undergo lab studies today in the vascular lab on      October 06, 2006.   The risks and benefits are explained to the patient in great detail.  Dr. Arbie Cookey has seen and evaluated the patient prior to admission and is  in agreement with the above.      Constance Holster, PA      Larina Earthly, M.D.  Electronically Signed    JMW/MEDQ  D:  10/06/2006  T:  10/07/2006  Job:  [id number]   cc:   Dr. Omer Jack

## 2011-01-07 NOTE — Discharge Summary (Signed)
NAMEGILBERTO, Margaret Castillo               ACCOUNT NO.:  [id number]   MEDICAL RECORD NO.:  [id number]          PATIENT TYPE:  INP   LOCATION:  2029                         FACILITY:  [facility]   PHYSICIAN:  Larina Earthly, M.D.    DATE OF BIRTH:  1934-09-08   DATE OF ADMISSION:  12/07/2006  DATE OF DISCHARGE:  12/09/2006                               DISCHARGE SUMMARY   FINAL DIAGNOSIS:  Left internal carotid artery stenosis, asymptomatic.   IN-HOSPITAL DIAGNOSIS:  Nausea and vomiting postoperatively.   SECONDARY DIAGNOSES:  1. Abdominal aortic aneurysm status post stent graft repair.  2. Hypertension.  3. Coronary artery disease status post myocardial infarction in 1982.      Normal adenosine stress test in February 2008.  4. Hyperlipidemia.  5. Hypothyroidism.  6. Polymyalgia rheumatica.  7. Nephrolithiasis.  8. Hemorrhoids with negative colonoscopy 2 years ago.  9. Status post open cholecystectomy.  10.Status post tonsillectomy.  11.Status post exploratory laparotomy.  12.Status post hysterectomy.  13.A history cystoscopy.  14.A history of dilatation and curettage status post miscarriage.   IN-HOSPITAL OPERATIONS AND PROCEDURES:  Left carotid endarterectomy with  Dacron patch angioplasty.   HISTORY AND PHYSICAL AND HOSPITAL COURSE:  Margaret Castillo is a 76 year old  Caucasian female status post abdominal aortic aneurysm repair using a  __________ aortic stent graft on October 11, 2006, by Dr. Arbie Cookey.  She  did well postoperatively and was discharged home on postop day #2.  The  patient underwent preoperative Doppler evaluation preceding and was  found to have severe symptomatic left internal carotid artery stenosis  at 80% to 99%.  There is only 20% to 39% on the right.  Due to the  severity of the patient's stenosis, Dr. Arbie Cookey recommended elective  repair.  He discussed the risks and benefits with the patient.  The  patient acknowledged her understanding and agreed to proceed.  The  surgery was scheduled for December 07, 2006.  For details of the patient's  past medical history and physical exam, please see dictated H and P.   The patient was take to the operating room on December 07, 2006, where she  underwent carotid endarterectomy with Dacron patch angioplasty.  The  patient tolerated this procedure well and returned to the intensive care  unit in stable condition.  Following surgery, the patient was noted to  be hemodynamically stable.  She was neurologically intact.  The  patient's postoperative course was complicated by nausea and vomiting on  postop day #1.  Pain meds were discontinued and antiemetics were  administered.  The patient's nausea and vomiting was improving by postop  day #2.  She was tolerating a regular diet well.  The remainder of the  patient's postoperative course was pretty much unremarkable.  The  patient's vital signs were stable.  She was noted to be afebrile.  Saturations were greater than 90%.  The patient was in normal sinus  rhythm.  She remained neurologically intact.  Incisions were clean, dry,  intact, and healing well.  Labs, on postop day #1, showed a white count  of 6.3, hemoglobin of  9.8, hematocrit 29, platelets count 260,000.  Sodium 138, potassium 3.8, chloride 108, bicarb 23, BUN 7, creatinine  0.56, glucose 93.  On postop day #2, the patient was out of bed and  ambulating well without difficulty.  Again, she remained neurologically  intact and tolerated a regular diet.   The patient was discharged to home in stable condition on postop day #2.   FOLLOWUP APPOINTMENTS:  Followup appointment will be arranged with Dr.  Arbie Cookey for in 3 weeks.  Our office will contact the patient with this  information.   ACTIVITY:  The patient was instructed no driving until released to do  so.  No heavy lifting over 10 pounds.  The patient was told to ambulate  3 to 4 times a day, progress as tolerated, and to continue her breathing   exercises.   INCISION CARE:  The patient was told to shower, washing her incisions  using soap and water.  She was to contact the office if she developed  any drainage or opening from any of her incision sites.   DIET:  The patient was educated on a diet to be low fat and low salt.   DISCHARGE MEDICATIONS:  1. Synthroid 112 mcg daily.  2. Prednisone 5 mg daily.  3. Prednisone 1 mg daily.  4. Vytorin 10/80 mg daily.  5. Tricor 145 mg daily.  6. Fosamax 70 mg weekly.  7. Calcium and vitamin D daily.  8. Ultram 1 to 2 tabs q.6 hours p.r.n.      Theda Belfast, [place]      Larina Earthly, M.D.  Electronically Signed    KMD/MEDQ  D:  01/25/2007  T:  01/25/2007  Job:  [id number]

## 2012-06-27 ENCOUNTER — Encounter: Payer: Self-pay | Admitting: Vascular Surgery

## 2012-08-03 ENCOUNTER — Encounter: Payer: Self-pay | Admitting: Vascular Surgery

## 2012-08-08 ENCOUNTER — Encounter: Payer: Self-pay | Admitting: Neurosurgery

## 2012-08-08 ENCOUNTER — Other Ambulatory Visit: Payer: Self-pay | Admitting: *Deleted

## 2012-08-08 DIAGNOSIS — I6529 Occlusion and stenosis of unspecified carotid artery: Secondary | ICD-10-CM

## 2012-08-08 DIAGNOSIS — Z48812 Encounter for surgical aftercare following surgery on the circulatory system: Secondary | ICD-10-CM

## 2012-08-09 ENCOUNTER — Other Ambulatory Visit (INDEPENDENT_AMBULATORY_CARE_PROVIDER_SITE_OTHER): Payer: Medicare Other | Admitting: *Deleted

## 2012-08-09 ENCOUNTER — Ambulatory Visit (INDEPENDENT_AMBULATORY_CARE_PROVIDER_SITE_OTHER): Payer: Medicare Other | Admitting: Neurosurgery

## 2012-08-09 ENCOUNTER — Encounter: Payer: Self-pay | Admitting: Neurosurgery

## 2012-08-09 VITALS — BP 85/63 | HR 69 | Resp 16 | Ht 66.0 in | Wt 192.2 lb

## 2012-08-09 DIAGNOSIS — I6529 Occlusion and stenosis of unspecified carotid artery: Secondary | ICD-10-CM

## 2012-08-09 DIAGNOSIS — Z48812 Encounter for surgical aftercare following surgery on the circulatory system: Secondary | ICD-10-CM

## 2012-08-09 DIAGNOSIS — R42 Dizziness and giddiness: Secondary | ICD-10-CM

## 2012-08-09 NOTE — Progress Notes (Signed)
[facility] [place] Carotid Office Note  CC: Carotid surveillance Referring Physician: Early  History of Present Illness: A 77 year old female patient of Dr. early status post left CEA in 2008. The patient denies any signs or symptoms of CVA, TIA, amaurosis fugax or any neural deficit. The patient does have some complaints of mild vertigo with turning her head too quickly from one side to the other. She has not been evaluated by her primary care physician which I encouraged her to do. I did reiterate this was not due to her carotid disease.  Past Medical History  Diagnosis Date  . Myocardial infarction 1982  . Hypothyroidism   . Polymyalgia rheumatica   . Carotid artery occlusion   . AAA (abdominal aortic aneurysm)     ROS: [x]  Positive   [ ]  Denies    General: [ ]  Weight loss, [ ]  Fever, [ ]  chills Neurologic: [x ] Dizziness-(positional), [ ]  Blackouts, [ ]  Seizure [ ]  Stroke, [ ]  "Mini stroke", [ ]  Slurred speech, [ ]  Temporary blindness; [ ]  weakness in arms or legs, [ ]  Hoarseness Cardiac: [ ]  Chest pain/pressure, [ ]  Shortness of breath at rest [ ]  Shortness of breath with exertion, [ ]  Atrial fibrillation or irregular heartbeat Vascular: [ ]  Pain in legs with walking, [ ]  Pain in legs at rest, [ ]  Pain in legs at night,  [ ]  Non-healing ulcer, [ ]  Blood clot in vein/DVT,   Pulmonary: [ ]  Home oxygen, [ ]  Productive cough, [ ]  Coughing up blood, [ ]  Asthma,  [ ]  Wheezing Musculoskeletal:  [ ]  Arthritis, [ ]  Low back pain, [ ]  Joint pain Hematologic: [ ]  Easy Bruising, [ ]  Anemia; [ ]  Hepatitis Gastrointestinal: [ ]  Blood in stool, [ ]  Gastroesophageal Reflux/heartburn, [ ]  Trouble swallowing Urinary: [ ]  chronic Kidney disease, [ ]  on HD - [ ]  MWF or [ ]  TTHS, [ ]  Burning with urination, [ ]  Difficulty urinating Skin: [ ]  Rashes, [ ]  Wounds Psychological: [ ]  Anxiety, [ ]  Depression   Social History History  Substance Use Topics  . Smoking status:  Former Smoker    Quit date: 08/22/1980  . Smokeless tobacco: Not on file  . Alcohol Use: No    Family History No family history on file.  Allergies  Allergen Reactions  . Codeine   . Penicillins     Current Outpatient Prescriptions  Medication Sig Dispense Refill  . alendronate (FOSAMAX) 70 MG tablet Take 70 mg by mouth every 7 (seven) days. Take with a full glass of water on an empty stomach.      . levothyroxine (SYNTHROID, LEVOTHROID) 112 MCG tablet Take 112 mcg by mouth daily.      . predniSONE (DELTASONE) 5 MG tablet Take 5 mg by mouth daily.      . rosuvastatin (CRESTOR) 10 MG tablet Take 10 mg by mouth daily.      . Vitamin D, Ergocalciferol, (DRISDOL) 50000 UNITS CAPS Take 50,000 Units by mouth.        Physical Examination  Filed Vitals:   08/09/12 1529  BP: 122/68  Pulse: 73  Resp: 16    Body mass index is 31.02 kg/(m^2).  General:  WDWN in NAD Gait: Normal HEENT: WNL Eyes: Pupils equal Pulmonary: normal non-labored breathing , without Rales, rhonchi,  wheezing Cardiac: RRR, without  Murmurs, rubs or gallops; Abdomen: soft, NT, no masses Skin: no rashes, ulcers noted  Vascular Exam Pulses: 3+ radial  pulses bilaterally Carotid bruits : Carotid pulses to auscultation no bruits are heard Extremities without ischemic changes, no Gangrene , no cellulitis; no open wounds;  Musculoskeletal: no muscle wasting or atrophy   Neurologic: A&O X 3; Appropriate Affect ; SENSATION: normal; MOTOR FUNCTION:  moving all extremities equally. Speech is fluent/normal  Non-Invasive Vascular Imaging CAROTID DUPLEX 08/09/2012  Right ICA 20 - 39 % stenosis Left ICA 0 - 19% stenosis The patient does have retrograde vertebral flow on the left  ASSESSMENT/PLAN: Asymptomatic carotid patient will followup in one year with repeat carotid duplex. I did reiterate the need to see her primary care physician regarding her vertigo. The patient is in agreement, her questions were  encouraged and answered.  Lauree Chandler ANP   Clinic MD: Darrick Penna

## 2013-08-12 ENCOUNTER — Encounter: Payer: Self-pay | Admitting: Family

## 2013-08-13 ENCOUNTER — Other Ambulatory Visit (HOSPITAL_COMMUNITY): Payer: Medicare Other

## 2013-08-13 ENCOUNTER — Ambulatory Visit (INDEPENDENT_AMBULATORY_CARE_PROVIDER_SITE_OTHER): Payer: Medicare Other | Admitting: Family

## 2013-08-13 ENCOUNTER — Encounter: Payer: Self-pay | Admitting: Family

## 2013-08-13 ENCOUNTER — Ambulatory Visit (HOSPITAL_COMMUNITY)
Admission: RE | Admit: 2013-08-13 | Discharge: 2013-08-13 | Disposition: A | Discharge status: Home / Self Care | Payer: Medicare Other | Source: Ambulatory Visit | Attending: Family | Admitting: Family

## 2013-08-13 ENCOUNTER — Encounter (INDEPENDENT_AMBULATORY_CARE_PROVIDER_SITE_OTHER): Payer: Self-pay

## 2013-08-13 DIAGNOSIS — I6529 Occlusion and stenosis of unspecified carotid artery: Secondary | ICD-10-CM

## 2013-08-13 DIAGNOSIS — Z48812 Encounter for surgical aftercare following surgery on the circulatory system: Secondary | ICD-10-CM | POA: Insufficient documentation

## 2013-08-13 DIAGNOSIS — M79609 Pain in unspecified limb: Secondary | ICD-10-CM

## 2013-08-13 NOTE — Progress Notes (Signed)
Established Carotid Patient   History of Present Illness  Margaret Castillo is a 78 y.o. female patient of Dr. Arbie Cookey status post left CEA in 2008; she also has known left subclavian artery stenosis. Pain in left wrist for about a month. Has polymyalgia rheumatica, has several fingers contractures that she attributes to this, cortisone injections improve this.  Reports that she has carpal tunnel syndrome in right wrist.  Patient has Negative history of TIA or stroke symptom.  The patient denies amaurosis fugax or monocular blindness.  The patient  denies facial drooping.  Pt. denies hemiplegia.  The patient denies receptive or expressive aphasia.     Patient denies New Medical or Surgical History.  Pt Diabetic: No Pt smoker: former smoker, quit in 1982  Pt meds include: Statin : No: caused myalgias and arthralgias ASA: No: she stopped taking in preparation for a surgery at some point and has not resumed, has not been advised to stop taking. Other anticoagulants/antiplatelets: no   Past Medical History  Diagnosis Date  . Myocardial infarction 1982  . Hypothyroidism   . Polymyalgia rheumatica   . Carotid artery occlusion   . AAA (abdominal aortic aneurysm)     Social History History  Substance Use Topics  . Smoking status: Former Smoker    Quit date: 08/22/1980  . Smokeless tobacco: Never Used  . Alcohol Use: No    Family History Family History  Problem Relation Age of Onset  . Heart disease Father   . Heart attack Father   . Cancer Sister   . Cancer Brother   . Cancer Brother   . Cancer Brother     Surgical History Past Surgical History  Procedure Laterality Date  . Abdominal aortic aneurysm repair  2008    Infrarenal aneurysm stent graft repair  . Carotid endarterectomy  2008    Left CEA    Allergies  Allergen Reactions  . Codeine Nausea Only  . Penicillins Rash    Current Outpatient Prescriptions  Medication Sig Dispense Refill  . alendronate  (FOSAMAX) 70 MG tablet Take 70 mg by mouth every 7 (seven) days. Take with a full glass of water on an empty stomach.      . calcium carbonate 200 MG capsule Take 250 mg by mouth daily.      Marland Kitchen levothyroxine (SYNTHROID, LEVOTHROID) 112 MCG tablet Take 112 mcg by mouth daily.      . predniSONE (DELTASONE) 5 MG tablet Take 5 mg by mouth daily.      . rosuvastatin (CRESTOR) 10 MG tablet Take 10 mg by mouth daily.      . Vitamin D, Ergocalciferol, (DRISDOL) 50000 UNITS CAPS Take 50,000 Units by mouth.       No current facility-administered medications for this visit.    Review of Systems : See HPI for pertinent positives and negatives.  Physical Examination  Filed Vitals:   08/13/13 1430  BP: 91/62  Pulse: 60  Resp: 14   Filed Weights   08/13/13 1430  Weight: 190 lb (86.183 kg)   Body mass index is 31.13 kg/(m^2).  General: WDWN obese female in NAD GAIT: slow, deliberate Eyes: PERRLA Pulmonary:  CTAB, Negative  Rales, Negative rhonchi, & Negative wheezing.  Cardiac: regular Rhythm ,  Negative detected Murmurs.  VASCULAR EXAM Carotid Bruits Left Right   Positive, mild Negative    Radial pulses are 2+ right, 1+ left, bilateral brachial pulses are 2+  LE Pulses LEFT RIGHT       POPLITEAL  not palpable   not palpable       POSTERIOR TIBIAL   palpable    palpable        DORSALIS PEDIS      ANTERIOR TIBIAL  palpable  palpable     Gastrointestinal: soft, nontender, BS WNL, no r/g,  negative masses.  Musculoskeletal: Negative muscle atrophy/wasting. M/S 4/5 throughout, Extremities without ischemic changes.  Neurologic: A&O X 3; Appropriate Affect ; SENSATION ;normal;  Speech is normal CN 2-12 intact except is very hard of hearing, Pain and light touch intact in extremities, Motor exam as listed above.   Non-Invasive Vascular Imaging CAROTID DUPLEX  08/13/2013   Right ICA: <40% stenosis. Left ICA (CEA site): no stenosis.  Mostly retrograde, bidirectional flow noted in the left vertebral artery. Monophasic flow noted in the left subclavian artery.   These findings are Unchanged from previous exam.  Assessment: Margaret Castillo is a 78 y.o. female who presents with asymptomatic minimal plaque in the right ICA and no plaque in the left ICA.  Continued Duplex evidence of left subclavian stenosis. Her 1 month history of left wrist pain is more likely related to her history of carpal tunnel syndrome in the right wrist which may now be affecting her left wrist, in the background of polymyalgia rheumatica.  The  ICA stenosis is  Unchanged from previous exam.  Plan:  Suggest resuming 81 mg daily ASA as antiplatelet agent unless her PCP is aware of contraindications for her that are not apparent.  Based on today's exam and Duplex results, and after discussing with Dr. Arbie Cookey, follow-up in 1 year with Carotid Duplex scan.   I discussed in depth with the patient the nature of atherosclerosis, and emphasized the importance of maximal medical management including strict control of blood pressure, blood glucose, and lipid levels, obtaining regular exercise, and continued cessation of smoking.  The patient is aware that without maximal medical management the underlying atherosclerotic disease process will progress, limiting the benefit of any interventions. The patient was given information about stroke prevention and what symptoms should prompt the patient to seek immediate medical care. Thank you for allowing [place] to participate in this patient's care.  Charisse March, RN, MSN, FNP-C [facility] [place] Office: [phone]  Clinic Physician: Early  08/13/2013 2:05 PM

## 2013-08-13 NOTE — Patient Instructions (Signed)
Stroke Prevention Some medical conditions and behaviors are associated with an increased chance of having a stroke. You may prevent a stroke by making healthy choices and managing medical conditions. Reduce your risk of having a stroke by:  Staying physically active. Get at least 30 minutes of activity on most or all days.  Not smoking. It may also be helpful to avoid exposure to secondhand smoke.  Limiting alcohol use. Moderate alcohol use is considered to be:  No more than 2 drinks per day for men.  No more than 1 drink per day for nonpregnant women.  Eating healthy foods.  Include 5 or more servings of fruits and vegetables a day.  Certain diets may be prescribed to address high blood pressure, high cholesterol, diabetes, or obesity.  Managing your cholesterol levels.  A low-saturated fat, low-trans fat, low-cholesterol, and high-fiber diet may control cholesterol levels.  Take any prescribed medicines to control cholesterol as directed by your caregiver.  Managing your diabetes.  A controlled-carbohydrate, controlled-sugar diet is recommended to manage diabetes.  Take any prescribed medicines to control diabetes as directed by your caregiver.  Controlling your high blood pressure (hypertension).  A low-salt (sodium), low-saturated fat, low-trans fat, and low-cholesterol diet is recommended to manage high blood pressure.  Take any prescribed medicines to control hypertension as directed by your caregiver.  Maintaining a healthy weight.  A reduced-calorie, low-sodium, low-saturated fat, low-trans fat, low-cholesterol diet is recommended to manage weight.  Stopping drug abuse.  Avoiding birth control pills.  Talk to your caregiver about the risks of taking birth control pills if you are over 35 years old, smoke, get migraines, or have ever had a blood clot.  Getting evaluated for sleep disorders (sleep apnea).  Talk to your caregiver about getting a sleep evaluation  if you snore a lot or have excessive sleepiness.  Taking medicines as directed by your caregiver.  For some people, aspirin or blood thinners (anticoagulants) are helpful in reducing the risk of forming abnormal blood clots that can lead to stroke. If you have the irregular heart rhythm of atrial fibrillation, you should be on a blood thinner unless there is a good reason you cannot take them.  Understand all your medicine instructions. SEEK IMMEDIATE MEDICAL CARE IF:   You have sudden weakness or numbness of the face, arm, or leg, especially on one side of the body.  You have sudden confusion.  You have trouble speaking (aphasia) or understanding.  You have sudden trouble seeing in one or both eyes.  You have sudden trouble walking.  You have dizziness.  You have a loss of balance or coordination.  You have a sudden, severe headache with no known cause.  You have new chest pain or an irregular heartbeat. Any of these symptoms may represent a serious problem that is an emergency. Do not wait to see if the symptoms will go away. Get medical help right away. Call your local emergency services (911 in [place] Do not drive yourself to the hospital. Document Released: 09/15/2004 Document Revised: 10/31/2011 Document Reviewed: 02/08/2013 [employer] Patient Information 2014 [employer].  

## 2014-08-19 ENCOUNTER — Ambulatory Visit: Payer: Medicare Other | Admitting: Family

## 2014-08-19 ENCOUNTER — Other Ambulatory Visit (HOSPITAL_COMMUNITY): Payer: Medicare Other

## 2014-08-19 ENCOUNTER — Encounter: Payer: Self-pay | Admitting: Vascular Surgery

## 2014-08-26 ENCOUNTER — Encounter: Payer: Self-pay | Admitting: Family

## 2014-08-26 ENCOUNTER — Ambulatory Visit (HOSPITAL_COMMUNITY)
Admission: RE | Admit: 2014-08-26 | Discharge: 2014-08-26 | Disposition: A | Discharge status: Home / Self Care | Payer: Medicare Other | Source: Ambulatory Visit | Attending: Family | Admitting: Family

## 2014-08-26 ENCOUNTER — Ambulatory Visit (INDEPENDENT_AMBULATORY_CARE_PROVIDER_SITE_OTHER): Payer: Medicare Other | Admitting: Family

## 2014-08-26 VITALS — BP 90/62 | HR 62 | Resp 14 | Ht 65.0 in | Wt 192.0 lb

## 2014-08-26 DIAGNOSIS — Z48812 Encounter for surgical aftercare following surgery on the circulatory system: Secondary | ICD-10-CM | POA: Diagnosis not present

## 2014-08-26 DIAGNOSIS — I6523 Occlusion and stenosis of bilateral carotid arteries: Secondary | ICD-10-CM | POA: Insufficient documentation

## 2014-08-26 DIAGNOSIS — Z95828 Presence of other vascular implants and grafts: Secondary | ICD-10-CM

## 2014-08-26 DIAGNOSIS — Z9889 Other specified postprocedural states: Secondary | ICD-10-CM

## 2014-08-26 NOTE — Patient Instructions (Signed)
Stroke Prevention Some medical conditions and behaviors are associated with an increased chance of having a stroke. You may prevent a stroke by making healthy choices and managing medical conditions. HOW CAN I REDUCE MY RISK OF HAVING A STROKE?   Stay physically active. Get at least 30 minutes of activity on most or all days.  Do not smoke. It may also be helpful to avoid exposure to secondhand smoke.  Limit alcohol use. Moderate alcohol use is considered to be:  No more than 2 drinks per day for men.  No more than 1 drink per day for nonpregnant women.  Eat healthy foods. This involves:  Eating 5 or more servings of fruits and vegetables a day.  Making dietary changes that address high blood pressure (hypertension), high cholesterol, diabetes, or obesity.  Manage your cholesterol levels.  Making food choices that are high in fiber and low in saturated fat, trans fat, and cholesterol may control cholesterol levels.  Take any prescribed medicines to control cholesterol as directed by your health care provider.  Manage your diabetes.  Controlling your carbohydrate and sugar intake is recommended to manage diabetes.  Take any prescribed medicines to control diabetes as directed by your health care provider.  Control your hypertension.  Making food choices that are low in salt (sodium), saturated fat, trans fat, and cholesterol is recommended to manage hypertension.  Take any prescribed medicines to control hypertension as directed by your health care provider.  Maintain a healthy weight.  Reducing calorie intake and making food choices that are low in sodium, saturated fat, trans fat, and cholesterol are recommended to manage weight.  Stop drug abuse.  Avoid taking birth control pills.  Talk to your health care provider about the risks of taking birth control pills if you are over 35 years old, smoke, get migraines, or have ever had a blood clot.  Get evaluated for sleep  disorders (sleep apnea).  Talk to your health care provider about getting a sleep evaluation if you snore a lot or have excessive sleepiness.  Take medicines only as directed by your health care provider.  For some people, aspirin or blood thinners (anticoagulants) are helpful in reducing the risk of forming abnormal blood clots that can lead to stroke. If you have the irregular heart rhythm of atrial fibrillation, you should be on a blood thinner unless there is a good reason you cannot take them.  Understand all your medicine instructions.  Make sure that other conditions (such as anemia or atherosclerosis) are addressed. SEEK IMMEDIATE MEDICAL CARE IF:   You have sudden weakness or numbness of the face, arm, or leg, especially on one side of the body.  Your face or eyelid droops to one side.  You have sudden confusion.  You have trouble speaking (aphasia) or understanding.  You have sudden trouble seeing in one or both eyes.  You have sudden trouble walking.  You have dizziness.  You have a loss of balance or coordination.  You have a sudden, severe headache with no known cause.  You have new chest pain or an irregular heartbeat. Any of these symptoms may represent a serious problem that is an emergency. Do not wait to see if the symptoms will go away. Get medical help at once. Call your local emergency services (911 in [place] Do not drive yourself to the hospital. Document Released: 09/15/2004 Document Revised: 12/23/2013 Document Reviewed: 02/08/2013 Exit[employer] Patient Information 2015 [employer]. This information is not intended to replace advice given   to you by your health care provider. Make sure you discuss any questions you have with your health care provider.  

## 2014-08-26 NOTE — Progress Notes (Signed)
Established Carotid Patient   History of Present Illness  Margaret Castillo patient of Dr. Arbie CookeyEarly who is status post left CEA in 2008; she also has known left subclavian artery stenosis. She is also s/p infrarenal aneurysm stent graft repair in 2008.  Has polymyalgia rheumatica, has several finger contractures that she attributes to this, cortisone injections improve this.  Reports that she has carpal tunnel syndrome in right wrist. She denies tingling, numbness, pain or weakness in her left upper extremity. She is hard of hearing and states many of her family members are also hard of hearing, is inherited. She takes meclizine prn for dizziness; reports dizziness is exacerbated with sudden rotation of her head. She reports a history of spontaneous fracture of sacrum which she was told is attributed to her long term use of prednisone for PMR.  Patient has Negative history of TIA or stroke symptom. The patient denies amaurosis fugax or monocular blindness. The patient denies facial drooping. Pt. denies hemiplegia. The patient denies receptive or expressive aphasia.   Patient denies New Medical or Surgical History.  Pt Diabetic: No Pt smoker: former smoker, quit in 1982  Pt meds include: Statin : No: caused myalgias and arthralgias ASA: No: she stopped taking in preparation for a surgery at some point and has not resumed, has not been advised to stop taking. Other anticoagulants/antiplatelets: no    Past Medical History  Diagnosis Date  . Myocardial infarction 1982  . Hypothyroidism   . Polymyalgia rheumatica   . Carotid artery occlusion   . AAA (abdominal aortic aneurysm)   . Anemia   . Arthritis     Neck    Social History History  Substance Use Topics  . Smoking status: Former Smoker    Quit date: 08/22/1980  . Smokeless tobacco: Never Used  . Alcohol Use: No    Family History Family History  Problem Relation Age of Onset  . Heart disease  Father     After age 78   . Heart attack Father   . Cancer Sister   . Diabetes Sister   . Cancer Brother   . Cancer Brother   . Heart disease Brother   . Cancer Brother     Prostate  . Diabetes Brother   . Stroke Mother   . Diabetes Mother   . Stroke Sister   . Heart disease Brother   . Cancer Paternal Grandfather   . Arthritis Brother   . Cancer Brother     Prostate  . Heart attack Sister     Surgical History Past Surgical History  Procedure Laterality Date  . Abdominal aortic aneurysm repair  2008    Infrarenal aneurysm stent graft repair  . Appendectomy    . Tonsillectomy    . Cholecystectomy      Gall Bladder  . Carpal tunnel release Left   . Eye surgery Bilateral     Cataract  . Carotid endarterectomy  12-07-06    Left CEA    Allergies  Allergen Reactions  . Codeine Nausea Only  . Crestor [Rosuvastatin] Other (See Comments)    Leg pain/ joint pain  ( ALL THE STATIN's )  . Penicillins Rash    Current Outpatient Prescriptions  Medication Sig Dispense Refill  . acetaminophen (TYLENOL) 325 MG tablet Take 650 mg by mouth every 6 (six) hours as needed.    Marland Kitchen. acetaminophen (TYLENOL) 500 MG tablet Take by mouth as needed.    . calcium  carbonate 200 MG capsule Take 250 mg by mouth daily.    . ferrous sulfate 325 (65 FE) MG tablet Take 325 mg by mouth daily with breakfast.    . levothyroxine (SYNTHROID) 112 MCG tablet Take by mouth daily.    . meclizine (ANTIVERT) 25 MG tablet Take 25 mg by mouth as needed.    . predniSONE (DELTASONE) 5 MG tablet Take 5 mg by mouth daily.    Marland Kitchen triamcinolone cream (KENALOG) 0.1 % as needed.    . Vitamin D, Ergocalciferol, (DRISDOL) 50000 UNITS CAPS capsule Take one capsule monthly    . Vitamin D, Ergocalciferol, (DRISDOL) 50000 UNITS CAPS Take 50,000 Units by mouth.    Marland Kitchen alendronate (FOSAMAX) 70 MG tablet Take 70 mg by mouth every 7 (seven) days. Take with a full glass of water on an empty stomach.    . levothyroxine (SYNTHROID,  LEVOTHROID) 112 MCG tablet Take 112 mcg by mouth daily.    . rosuvastatin (CRESTOR) 10 MG tablet Take 10 mg by mouth daily.     No current facility-administered medications for this visit.    Review of Systems : See HPI for pertinent positives and negatives.  Physical Examination  Filed Vitals:   08/26/14 1252 08/26/14 1302  BP: 96/67 90/62  Pulse: 62 62  Resp:  14  Height:   (1.651 m)  Weight:  192 lb (87.091 kg)  SpO2:  100%   Body mass index is 31.95 kg/(m^2).  General: WDWN obese Castillo in NAD GAIT: slow, deliberate Eyes: PERRLA Pulmonary: CTAB, Negative Rales, Negative rhonchi, & Negative wheezing.  Cardiac: regular Rhythm, positive murmur.  VASCULAR EXAM Carotid Bruits Left Right   Positive positive   Radial pulses are 2+bilaterally Aorta is not palpable      LE Pulses LEFT RIGHT   POPLITEAL not palpable  not palpable   POSTERIOR TIBIAL  palpable   palpable    DORSALIS PEDIS  ANTERIOR TIBIAL palpable  palpable     Gastrointestinal: soft, nontender, BS WNL, no r/g, no palpable masses.  Musculoskeletal: Negative muscle atrophy/wasting. M/S 4/5 throughout, Extremities without ischemic changes.  Neurologic: A&O X 3; Appropriate Affect, Speech is normal CN 2-12 intact except is very hard of hearing, Pain and light touch intact in extremities, Motor exam as listed above.    Non-Invasive Vascular Imaging CAROTID DUPLEX 08/26/2014   Right ICA: <40% stenosis. Left ICA: patent CEA site. Monophasic flow in the left subclavian artery suggests hemodynamically significant proximal stenosis.   These findings are Unchanged from previous exam of 08/13/13.   Assessment: Margaret Castillo is a 80 y.o. Castillo who is status post left CEA in 2008; she also has known left subclavian artery  stenosis. She is also s/p infrarenal aneurysm stent graft repair in 2008. She presents with asymptomatic minimal right ICA stenosis and patent left ICA which is a CEA site. The  ICA stenosis is  Unchanged  from previous exam.  January 2011 CTA abdomen/pelvis for EVAR surveillance:  1. Stable appearance of AAA stent with no endovascular leak noted. 2. Stable size of native abdominal aortic aneurysm.  Will check EVAR Duplex when she returns in a year. Resume daily 81 mg coated ASA to reduce her risk of CVD events.  Plan: Follow-up in 1 year with Carotid Duplex and EVAR Duplex   I discussed in depth with the patient the nature of atherosclerosis, and emphasized the importance of maximal medical management including strict control of blood pressure, blood glucose, and lipid levels,  obtaining regular exercise, and continued cessation of smoking.  The patient is aware that without maximal medical management the underlying atherosclerotic disease process will progress, limiting the benefit of any interventions. The patient was given information about stroke prevention and what symptoms should prompt the patient to seek immediate medical care. Thank you for allowing [place] to participate in this patient's care.  Charisse March, RN, MSN, FNP-C [facility] [place] Office: [phone]  Clinic Physician: Early  08/26/2014 1:11 PM

## 2014-08-28 NOTE — Addendum Note (Signed)
Addended by: Sharee PimpleMCCHESNEY, MARILYN K on: 08/28/2014 09:18 AM   Modules accepted: Orders

## 2015-08-28 ENCOUNTER — Encounter: Payer: Self-pay | Admitting: Family

## 2015-09-01 ENCOUNTER — Inpatient Hospital Stay (HOSPITAL_COMMUNITY): Admission: RE | Admit: 2015-09-01 | Payer: Medicare Other | Source: Ambulatory Visit

## 2015-09-01 ENCOUNTER — Ambulatory Visit: Payer: Medicare Other | Admitting: Family

## 2015-09-01 ENCOUNTER — Encounter (HOSPITAL_COMMUNITY): Payer: Medicare Other

## 2015-11-12 ENCOUNTER — Encounter: Payer: Self-pay | Admitting: Family

## 2015-11-20 ENCOUNTER — Ambulatory Visit (HOSPITAL_COMMUNITY)
Admission: RE | Admit: 2015-11-20 | Discharge: 2015-11-20 | Disposition: A | Discharge status: Home / Self Care | Payer: Medicare Other | Source: Ambulatory Visit | Attending: Family | Admitting: Family

## 2015-11-20 ENCOUNTER — Other Ambulatory Visit: Payer: Self-pay | Admitting: *Deleted

## 2015-11-20 ENCOUNTER — Ambulatory Visit (INDEPENDENT_AMBULATORY_CARE_PROVIDER_SITE_OTHER): Payer: Medicare Other | Admitting: Family

## 2015-11-20 ENCOUNTER — Telehealth: Payer: Self-pay | Admitting: Vascular Surgery

## 2015-11-20 ENCOUNTER — Encounter: Payer: Self-pay | Admitting: Family

## 2015-11-20 ENCOUNTER — Ambulatory Visit (INDEPENDENT_AMBULATORY_CARE_PROVIDER_SITE_OTHER)
Admission: RE | Admit: 2015-11-20 | Discharge: 2015-11-20 | Disposition: A | Discharge status: Home / Self Care | Payer: Medicare Other | Source: Ambulatory Visit | Attending: Family | Admitting: Family

## 2015-11-20 VITALS — BP 78/58 | HR 68 | Temp 97.4°F | Resp 16 | Ht 66.0 in | Wt 192.0 lb

## 2015-11-20 DIAGNOSIS — Z48812 Encounter for surgical aftercare following surgery on the circulatory system: Secondary | ICD-10-CM

## 2015-11-20 DIAGNOSIS — I771 Stricture of artery: Secondary | ICD-10-CM | POA: Diagnosis not present

## 2015-11-20 DIAGNOSIS — I714 Abdominal aortic aneurysm, without rupture, unspecified: Secondary | ICD-10-CM

## 2015-11-20 DIAGNOSIS — I6521 Occlusion and stenosis of right carotid artery: Secondary | ICD-10-CM | POA: Diagnosis not present

## 2015-11-20 DIAGNOSIS — Z95828 Presence of other vascular implants and grafts: Secondary | ICD-10-CM | POA: Insufficient documentation

## 2015-11-20 DIAGNOSIS — I252 Old myocardial infarction: Secondary | ICD-10-CM | POA: Insufficient documentation

## 2015-11-20 DIAGNOSIS — I6522 Occlusion and stenosis of left carotid artery: Secondary | ICD-10-CM | POA: Diagnosis not present

## 2015-11-20 NOTE — Progress Notes (Signed)
Chief Complaint: Follow up Extracranial Carotid Artery Stenosis/ s/p EVAR   History of Present Illness  [name] is a 81 y.o. female patient of Dr. Arbie CookeyEarly who is status post left CEA in 2008; she also has known left subclavian artery stenosis. She is also s/p infrarenal aneurysm stent graft repair in 2008.  Has polymyalgia rheumatica, has several finger contractures that she attributes to this, cortisone injections improve this.  Reports that she has carpal tunnel syndrome in right wrist.  She denies tingling, numbness, pain or weakness in her left upper extremity, states occasional tingling in her right UE. She is hard of hearing and states many of her family members are also hard of hearing, is inherited. She takes meclizine prn for dizziness; reports dizziness is exacerbated with sudden rotation of her head. She reports a history of spontaneous fracture of sacrum which she was told is attributed to her long term use of prednisone for PMR.  She has opthalmic migraines for years, occurs about every other week as manifested by "snakey black thing in one eye or the other that starts out small and gets bigger". 5 days ago she had a different opthalmic event in which a grey shade seemed to come half way up her right eye, lasted for about 10 minutes, then resolved. She denies any hemiparesis or speech difficulties. She saw her ophthalmologist two days ago; pt states he told her that she probably had a TIA. He gave a note to the pt to give to us that reads" HH plaque OS, amaurosis fugax OD".    Pt Diabetic: No Pt smoker: former smoker, quit in 1982  Pt meds include: Statin : No: caused myalgias and arthralgias ASA: No: she stopped taking in preparation for a surgery at some point and has not resumed, has not been advised to stop taking. She was advised at her last visit on 08/26/14 to take a daily 81 mg ASA. Other anticoagulants/antiplatelets: no     Past Medical History  Diagnosis  Date  . Myocardial infarction (HCC) 1982  . Hypothyroidism   . Polymyalgia rheumatica (HCC)   . Carotid artery occlusion   . AAA (abdominal aortic aneurysm) (HCC)   . Anemia   . Arthritis     Neck    Social History Social History  Substance Use Topics  . Smoking status: Former Smoker    Quit date: 08/22/1980  . Smokeless tobacco: Never Used  . Alcohol Use: No    Family History Family History  Problem Relation Age of Onset  . Heart disease Father     After age 860   . Heart attack Father   . Cancer Sister   . Diabetes Sister   . Cancer Brother   . Cancer Brother   . Heart disease Brother   . Cancer Brother     Prostate  . Diabetes Brother   . Stroke Mother   . Diabetes Mother   . Stroke Sister   . Heart disease Brother   . Cancer Paternal Grandfather   . Arthritis Brother   . Cancer Brother     Prostate  . Heart attack Sister     Surgical History Past Surgical History  Procedure Laterality Date  . Abdominal aortic aneurysm repair  2008    Infrarenal aneurysm stent graft repair  . Appendectomy    . Tonsillectomy    . Cholecystectomy      Gall Bladder  . Carpal tunnel release Left   . Eye  surgery Bilateral     Cataract  . Carotid endarterectomy  12-07-06    Left CEA    Allergies  Allergen Reactions  . Codeine Nausea Only  . Crestor [Rosuvastatin] Other (See Comments)    Leg pain/ joint pain  ( ALL THE STATIN's )  . Penicillins Rash    Current Outpatient Prescriptions  Medication Sig Dispense Refill  . acetaminophen (TYLENOL) 325 MG tablet Take 650 mg by mouth every 6 (six) hours as needed.    Marland Kitchen acetaminophen (TYLENOL) 500 MG tablet Take by mouth as needed.    Marland Kitchen alendronate (FOSAMAX) 70 MG tablet Take 70 mg by mouth every 7 (seven) days. Take with a full glass of water on an empty stomach.    . calcium carbonate 200 MG capsule Take 250 mg by mouth daily.    . ferrous sulfate 325 (65 FE) MG tablet Take 325 mg by mouth daily with breakfast.    .  levothyroxine (SYNTHROID) 112 MCG tablet Take by mouth daily.    Marland Kitchen levothyroxine (SYNTHROID, LEVOTHROID) 112 MCG tablet Take 112 mcg by mouth daily.    . meclizine (ANTIVERT) 25 MG tablet Take 25 mg by mouth as needed.    . predniSONE (DELTASONE) 5 MG tablet Take 5 mg by mouth daily.    . rosuvastatin (CRESTOR) 10 MG tablet Take 10 mg by mouth daily.    Marland Kitchen triamcinolone cream (KENALOG) 0.1 % as needed.    . Vitamin D, Ergocalciferol, (DRISDOL) 50000 UNITS CAPS capsule Take one capsule monthly    . Vitamin D, Ergocalciferol, (DRISDOL) 50000 UNITS CAPS Take 50,000 Units by mouth.     No current facility-administered medications for this visit.    Review of Systems : See HPI for pertinent positives and negatives.  Physical Examination  Filed Vitals:   11/20/15 1100  BP: 78/55  Pulse: 72  Temp: 97.4 F (36.3 C)  TempSrc: Oral  Resp: 16  Height:  (1.676 m)  Weight: 192 lb (87.091 kg)  SpO2: 98%   Body mass index is 31 kg/(m^2).  General: WDWN obese female in NAD GAIT: slow, deliberate Eyes: PERRLA Pulmonary: CTAB, respirations are non labored, no rales,  rhonchi, or wheezing.  Cardiac: regular rhythm, positive murmur.  VASCULAR EXAM Carotid Bruits Left Right   Positive positive   Radial, ulnar, and brachial pulses are not palpable bilaterally Aorta is not palpable      LE Pulses LEFT RIGHT   POPLITEAL  palpable  not palpable   POSTERIOR TIBIAL  palpable   palpable    DORSALIS PEDIS  ANTERIOR TIBIAL palpable  palpable     Gastrointestinal: soft, nontender, BS WNL, no r/g, no palpable masses.  Musculoskeletal: Negative muscle atrophy/wasting. M/S 4/5 throughout, Extremities without ischemic changes.  Neurologic: A&O X 3; Appropriate Affect, Speech is normal CN 2-12 intact  except is very hard of hearing, Pain and light touch intact in extremities, Motor exam as listed above.          Non-Invasive Vascular Imaging (11/20/2015):  CAROTID DUPLEX   Significant stenosis of the right proximal CCA resulting in abnormal tardus parvus waveforms in the common, internal, and external carotid arteries. Patent left CEA site with no evidence of hyperplasia or restenosis.  Left mid ICA suggests 40-59% stenosis; however, elevated velocities are most likely dud to vessel tortuosity in this segment with no plaque observed. Bilateral vertebral arteries are abnormal with monophasic subclavian arteries. Disease progression of the right CCA and right subclavian artery.  EVAR DUPLEX: Patent EVAR with a maximum diameter of 4.55 cm. No evidence of endoleak, based on limited visualization due to overlying bowel gas.  Slight increase in maximum diameter of abdominal aorta compared to CTA in 2011 which indicated a maximum diameter of 4.2 cm.    Assessment: Margaret Castillo is a 81 y.o. female who is status post left CEA in 2008; she also has known left subclavian artery stenosis. She is also s/p infrarenal aneurysm stent graft repair in 2008.   5 days ago she had a different opthalmic event than her migraine headaches in which a grey shade seemed to come half way up her right eye, lasted for about 10 minutes, then resolved. She denies any hemiparesis or speech difficulties. Discussed with Dr. Imogene Burn pt HPI, non palpable radial, ulnar, and brachial pulses bilaterally, strong palpable pulses in femorals, popliteals, and pedal pulses; and episode of amaurosis fugax on 11/18/15; and that pt saw her ophthalmologist 2 days ago whose assessment was HH plaque OS and amaurosis fugax OD.   Slight increase in maximum diameter of abdominal aorta compared to CTA in 2011 which indicated a maximum diameter of 4.2 cm, based on limited visualization.   Plan: Will schedule pt for CTA of neck,  indications: suggestion of high grade stenosis in right CCA and bilateral subclavian stenosis; transient amaurosis fugax on 11/18/15. Again advised to take 81 mg ASA daily.    I discussed in depth with the patient the nature of atherosclerosis, and emphasized the importance of maximal medical management including strict control of blood pressure, blood glucose, and lipid levels, obtaining regular exercise, and continued cessation of smoking.  The patient is aware that without maximal medical management the underlying atherosclerotic disease process will progress, limiting the benefit of any interventions. The patient was given information about stroke prevention and what symptoms should prompt the patient to seek immediate medical care. Thank you for allowing [place] to participate in this patient's care.  Charisse March, RN, MSN, FNP-C [facility] [place] Office: [phone]  Clinic Physician: Imogene Burn  11/20/2015 11:07 AM

## 2015-11-20 NOTE — Telephone Encounter (Signed)
[facility] 12/02/15 9:30 am. No solids 4 hrs prior. Follow up Dr. Arbie CookeyEarly 12/08/15 10:45 am. Pt verbalized understanding.

## 2015-11-20 NOTE — Patient Instructions (Signed)
Stroke Prevention Some medical conditions and behaviors are associated with an increased chance of having a stroke. You may prevent a stroke by making healthy choices and managing medical conditions. HOW CAN I REDUCE MY RISK OF HAVING A STROKE?   Stay physically active. Get at least 30 minutes of activity on most or all days.  Do not smoke. It may also be helpful to avoid exposure to secondhand smoke.  Limit alcohol use. Moderate alcohol use is considered to be:  No more than 2 drinks per day for men.  No more than 1 drink per day for nonpregnant women.  Eat healthy foods. This involves:  Eating 5 or more servings of fruits and vegetables a day.  Making dietary changes that address high blood pressure (hypertension), high cholesterol, diabetes, or obesity.  Manage your cholesterol levels.  Making food choices that are high in fiber and low in saturated fat, trans fat, and cholesterol may control cholesterol levels.  Take any prescribed medicines to control cholesterol as directed by your health care provider.  Manage your diabetes.  Controlling your carbohydrate and sugar intake is recommended to manage diabetes.  Take any prescribed medicines to control diabetes as directed by your health care provider.  Control your hypertension.  Making food choices that are low in salt (sodium), saturated fat, trans fat, and cholesterol is recommended to manage hypertension.  Ask your health care provider if you need treatment to lower your blood pressure. Take any prescribed medicines to control hypertension as directed by your health care provider.  If you are 18-39 years of age, have your blood pressure checked every 3-5 years. If you are 40 years of age or older, have your blood pressure checked every year.  Maintain a healthy weight.  Reducing calorie intake and making food choices that are low in sodium, saturated fat, trans fat, and cholesterol are recommended to manage  weight.  Stop drug abuse.  Avoid taking birth control pills.  Talk to your health care provider about the risks of taking birth control pills if you are over 35 years old, smoke, get migraines, or have ever had a blood clot.  Get evaluated for sleep disorders (sleep apnea).  Talk to your health care provider about getting a sleep evaluation if you snore a lot or have excessive sleepiness.  Take medicines only as directed by your health care provider.  For some people, aspirin or blood thinners (anticoagulants) are helpful in reducing the risk of forming abnormal blood clots that can lead to stroke. If you have the irregular heart rhythm of atrial fibrillation, you should be on a blood thinner unless there is a good reason you cannot take them.  Understand all your medicine instructions.  Make sure that other conditions (such as anemia or atherosclerosis) are addressed. SEEK IMMEDIATE MEDICAL CARE IF:   You have sudden weakness or numbness of the face, arm, or leg, especially on one side of the body.  Your face or eyelid droops to one side.  You have sudden confusion.  You have trouble speaking (aphasia) or understanding.  You have sudden trouble seeing in one or both eyes.  You have sudden trouble walking.  You have dizziness.  You have a loss of balance or coordination.  You have a sudden, severe headache with no known cause.  You have new chest pain or an irregular heartbeat. Any of these symptoms may represent a serious problem that is an emergency. Do not wait to see if the symptoms will   go away. Get medical help at once. Call your local emergency services (911 in [place] Do not drive yourself to the hospital.   This information is not intended to replace advice given to you by your health care provider. Make sure you discuss any questions you have with your health care provider.   Document Released: 09/15/2004 Document Revised: 08/29/2014 Document Reviewed:  02/08/2013 Elsevier Interactive Patient Education 2016 Elsevier Inc.  

## 2015-11-27 ENCOUNTER — Encounter: Payer: Self-pay | Admitting: Vascular Surgery

## 2015-12-02 ENCOUNTER — Ambulatory Visit
Admission: RE | Admit: 2015-12-02 | Discharge: 2015-12-02 | Disposition: A | Discharge status: Home / Self Care | Payer: Medicare Other | Source: Ambulatory Visit | Attending: Family | Admitting: Family

## 2015-12-02 DIAGNOSIS — I6521 Occlusion and stenosis of right carotid artery: Secondary | ICD-10-CM

## 2015-12-02 DIAGNOSIS — I771 Stricture of artery: Secondary | ICD-10-CM

## 2015-12-02 MED ORDER — IOPAMIDOL (ISOVUE-370) INJECTION 76%
100.0000 mL | Freq: Once | INTRAVENOUS | Status: AC | PRN
  Administered 2015-12-02: 100 mL via INTRAVENOUS

## 2015-12-08 ENCOUNTER — Ambulatory Visit (INDEPENDENT_AMBULATORY_CARE_PROVIDER_SITE_OTHER): Payer: Medicare Other | Admitting: Vascular Surgery

## 2015-12-08 ENCOUNTER — Encounter: Payer: Self-pay | Admitting: Vascular Surgery

## 2015-12-08 VITALS — BP 85/64 | HR 64 | Temp 97.4°F | Resp 18 | Ht 64.0 in | Wt 192.9 lb

## 2015-12-08 DIAGNOSIS — Z95828 Presence of other vascular implants and grafts: Secondary | ICD-10-CM

## 2015-12-08 DIAGNOSIS — Z48812 Encounter for surgical aftercare following surgery on the circulatory system: Secondary | ICD-10-CM | POA: Diagnosis not present

## 2015-12-08 DIAGNOSIS — I771 Stricture of artery: Secondary | ICD-10-CM

## 2015-12-08 NOTE — Progress Notes (Signed)
[facility] [place]  Patient name: Margaret Castillo MRN: [id number] DOB: 04/15/1935 Sex: female  REASON FOR VISIT: Follow-up extracranial sure vascular occlusive disease  HPI: Margaret Castillo is a 81 y.o. female here today for discussion of recent CT angiogram. She has a known history of extracranial cerebrovascular occlusive disease. She is status post left carotid endarterectomy by myself 9 years ago. We have been following her with noninvasive studies. She has had prior stent graft repair of abdominal aortic aneurysm. She underwent recent surveillance of both of these. During that office visit with our nurse practitioner, she reported having an episode of temporary blurring of vision in her right eye. Suspect this could've potentially been consistent with amaurosis fugax. She subsequently was scheduled for outpatient CT angiogram is here today for discussion of this procedure which was done on 12/02/2015. She is here today with her son. She has multiple medical difficulties which are outlined below. She does have generalized weakness which may be related to polymyalgia rheumatica or hypothyroidism. She specifically denies any prior history of focal neurologic deficit.  Past Medical History  Diagnosis Date  . Myocardial infarction (HCC) 1982  . Hypothyroidism   . Polymyalgia rheumatica (HCC)   . Carotid artery occlusion   . AAA (abdominal aortic aneurysm) (HCC)   . Anemia   . Arthritis     Neck    Family History  Problem Relation Age of Onset  . Heart disease Father     After age 79   . Heart attack Father   . Cancer Sister   . Diabetes Sister   . Cancer Brother   . Cancer Brother   . Heart disease Brother   . Cancer Brother     Prostate  . Diabetes Brother   . Stroke Mother   . Diabetes Mother   . Stroke Sister   . Heart disease Brother   . Cancer Paternal Grandfather   . Arthritis Brother   . Cancer Brother     Prostate  . Heart attack Sister      SOCIAL HISTORY: Social History  Substance Use Topics  . Smoking status: Former Smoker    Quit date: 08/22/1980  . Smokeless tobacco: Never Used  . Alcohol Use: No    Allergies  Allergen Reactions  . Codeine Nausea Only  . Crestor [Rosuvastatin] Other (See Comments)    Leg pain/ joint pain  ( ALL THE STATIN's )  . Penicillins Rash    Current Outpatient Prescriptions  Medication Sig Dispense Refill  . acetaminophen (TYLENOL) 500 MG tablet Take by mouth as needed.    Marland Kitchen. alendronate (FOSAMAX) 70 MG tablet Take 70 mg by mouth every 7 (seven) days. Take with a full glass of water on an empty stomach.    . calcium carbonate 200 MG capsule Take 250 mg by mouth daily.    . ferrous sulfate 325 (65 FE) MG tablet Take 325 mg by mouth daily with breakfast.    . levothyroxine (SYNTHROID) 112 MCG tablet Take 112 mcg by mouth daily. Patient alternates with 100mcg every other day    . meclizine (ANTIVERT) 25 MG tablet Take 25 mg by mouth as needed.    . predniSONE (DELTASONE) 5 MG tablet Take 5 mg by mouth daily.    Marland Kitchen. triamcinolone cream (KENALOG) 0.1 % as needed.    . Vitamin D, Ergocalciferol, (DRISDOL) 50000 UNITS CAPS Take 50,000 Units by mouth.     No current facility-administered medications for  this visit.    REVIEW OF SYSTEMS:   denotes positive finding,  denotes negative finding Cardiac  Comments:  Chest pain or chest pressure:    Shortness of breath upon exertion:    Short of breath when lying flat:    Irregular heart rhythm:        Vascular    Pain in calf, thigh, or hip brought on by ambulation:    Pain in feet at night that wakes you up from your sleep:     Blood clot in your veins:    Leg swelling:         Pulmonary    Oxygen at home:    Productive cough:     Wheezing:         Neurologic    Sudden weakness in arms or legs:     Sudden numbness in arms or legs:     Sudden onset of difficulty speaking or slurred speech:    Temporary loss of vision in one  eye:     Problems with dizziness:         Gastrointestinal    Blood in stool:     Vomited blood:         Genitourinary    Burning when urinating:     Blood in urine:        Psychiatric    Major depression:         Hematologic    Bleeding problems:    Problems with blood clotting too easily:        Skin    Rashes or ulcers:        Constitutional    Fever or chills:      PHYSICAL EXAM: Filed Vitals:   12/08/15 1029  BP: 85/64  Pulse: 64  Temp: 97.4 F (36.3 C)  TempSrc: Oral  Resp: 18  Height:  (1.626 m)  Weight: 192 lb 14.4 oz (87.499 kg)  SpO2: 99%    GENERAL: The patient is a well-nourished female, in no acute distress. The vital signs are documented above. CARDIAC: There is a regular rate and rhythm.  VASCULAR: Faint right radial and no left radial pulse. Soft bilateral carotid bruits. PULMONARY: There is good air exchange bilaterally without wheezing or rales. MUSCULOSKELETAL: There are no major deformities or cyanosis. NEUROLOGIC: No focal weakness or paresthesias are detected. SKIN: There are no ulcers or rashes noted. PSYCHIATRIC: The patient has a normal affect.  DATA:  I reviewed the CT scan images and discuss them with the patient and her son. She does have high-grade stenoses of all brachiocephalic vessels. This includes her innominate, left carotid and left subclavian arteries. She has no evidence of stenosis in her left endarterectomy site. She has mild nonflow limiting plaque in her right carotid bifurcation.  MEDICAL ISSUES: Very difficult management case. Certainly would not recommend surgical repair at this time. Explained that this would require median sternotomy and replacement of her innominate artery. Also explained there is no way of knowing that this brief visual change was related to embolic event. She has not been on aspirin or other antiplatelet agents. We will start her on aspirin 81 mg enteric-coated per day. She'll notify us  immediately should she have any further episodes of amaurosis or focal neurologic deficits. If she does we would recommend arch arteriography for further evaluation and possible revascularization. Will see her again in 6 months with repeat noninvasive studies.    Gretta Began [facility] [employer]: [phone]

## 2016-01-12 ENCOUNTER — Telehealth: Payer: Self-pay | Admitting: *Deleted

## 2016-01-12 DIAGNOSIS — Z48812 Encounter for surgical aftercare following surgery on the circulatory system: Secondary | ICD-10-CM

## 2016-01-12 DIAGNOSIS — I714 Abdominal aortic aneurysm, without rupture, unspecified: Secondary | ICD-10-CM

## 2016-01-12 NOTE — Telephone Encounter (Signed)
Margaret Castillo's son, Judie GrieveBryan, had called us on 01-11-16 saying his [facility] and left flank pain. They perform a regular CT scan and the radiologist thought that her AAA stent (done in 2011) may have an endoleak. I reviewed this CT with Dr. Arbie CookeyEarly, from [facility], and Dr. Arbie CookeyEarly does want to follow up on this. He is ordering a CTA abdomen/pelvis to rule out endoleak or AAA enlargement.   I spoke to Mrs. Andrey CampanileWilson today and she is agreeable to this plan. Our office will schedule this CTA and also an appt with Dr. Arbie CookeyEarly to discuss afterwards.

## 2016-01-13 ENCOUNTER — Encounter: Payer: Self-pay | Admitting: Vascular Surgery

## 2016-01-13 NOTE — Telephone Encounter (Signed)
Sched appts 01/20/16; [facility] at [facility], arrive at 10:30. [facility] at 1. Spoke to pt to inform them of appts.

## 2016-01-15 ENCOUNTER — Other Ambulatory Visit: Payer: Self-pay | Admitting: *Deleted

## 2016-01-15 DIAGNOSIS — I6523 Occlusion and stenosis of bilateral carotid arteries: Secondary | ICD-10-CM

## 2016-01-20 ENCOUNTER — Ambulatory Visit (INDEPENDENT_AMBULATORY_CARE_PROVIDER_SITE_OTHER): Payer: Medicare Other | Admitting: Vascular Surgery

## 2016-01-20 ENCOUNTER — Encounter: Payer: Self-pay | Admitting: Vascular Surgery

## 2016-01-20 ENCOUNTER — Ambulatory Visit: Payer: Medicare Other | Admitting: Vascular Surgery

## 2016-01-20 ENCOUNTER — Ambulatory Visit
Admission: RE | Admit: 2016-01-20 | Discharge: 2016-01-20 | Disposition: A | Discharge status: Home / Self Care | Payer: Medicare Other | Source: Ambulatory Visit | Attending: Vascular Surgery | Admitting: Vascular Surgery

## 2016-01-20 VITALS — BP 64/51 | HR 88 | Ht 64.0 in | Wt 188.9 lb

## 2016-01-20 DIAGNOSIS — I714 Abdominal aortic aneurysm, without rupture, unspecified: Secondary | ICD-10-CM

## 2016-01-20 DIAGNOSIS — Z48812 Encounter for surgical aftercare following surgery on the circulatory system: Secondary | ICD-10-CM

## 2016-01-20 MED ORDER — IOPAMIDOL (ISOVUE-370) INJECTION 76%
75.0000 mL | Freq: Once | INTRAVENOUS | Status: AC | PRN
  Administered 2016-01-20: 75 mL via INTRAVENOUS

## 2016-01-20 NOTE — Progress Notes (Signed)
[facility] [place]  Patient name: Margaret Castillo MRN: [id number] DOB: Feb 02, 1935 Sex: female  REASON FOR VISIT: Follow-up AAA  HPI: Margaret Castillo is a 81 y.o. female who presents for discussion of her CTA results. The patient underwent endovascular stent graft repair of her AAA in 2008. The [facility] of flank pain and abdominal pain. On a noncontrast CT, this revealed an enlargement of her native aneurysm sac. She was sent for a CTA for further evaluation. She has been followed annually with a duplex of her abdomen. Her last EVAR duplex revealed a maximum diameter 4.5 cm.   The patient denies any further abdominal pain.   Past Medical History  Diagnosis Date  . Myocardial infarction (HCC) 1982  . Hypothyroidism   . Polymyalgia rheumatica (HCC)   . Carotid artery occlusion   . AAA (abdominal aortic aneurysm) (HCC)   . Anemia   . Arthritis     Neck    Family History  Problem Relation Age of Onset  . Heart disease Father     After age 79   . Heart attack Father   . Cancer Sister   . Diabetes Sister   . Cancer Brother   . Cancer Brother   . Heart disease Brother   . Cancer Brother     Prostate  . Diabetes Brother   . Stroke Mother   . Diabetes Mother   . Stroke Sister   . Heart disease Brother   . Cancer Paternal Grandfather   . Arthritis Brother   . Cancer Brother     Prostate  . Heart attack Sister     SOCIAL HISTORY: Social History  Substance Use Topics  . Smoking status: Former Smoker    Quit date: 08/22/1980  . Smokeless tobacco: Never Used  . Alcohol Use: No    Allergies  Allergen Reactions  . Codeine Nausea Only  . Crestor [Rosuvastatin] Other (See Comments)    Leg pain/ joint pain  ( ALL THE STATIN's )  . Penicillins Rash  . Atorvastatin     Other reaction(s): MUSCLE PAIN (all statins)  . Cephalexin Rash    Current Outpatient Prescriptions  Medication Sig Dispense Refill  .  acetaminophen (TYLENOL) 500 MG tablet Take by mouth as needed.    . calcium carbonate 200 MG capsule Take 250 mg by mouth daily.    . cyanocobalamin (,VITAMIN B-12,) 1000 MCG/ML injection Inject into the muscle.    . ferrous sulfate 325 (65 FE) MG tablet Take 325 mg by mouth daily with breakfast.    . levothyroxine (SYNTHROID) 112 MCG tablet Take 112 mcg by mouth daily. Patient alternates with 100mcg every other day    . meclizine (ANTIVERT) 25 MG tablet Take 25 mg by mouth as needed.    . predniSONE (DELTASONE) 5 MG tablet Take 5 mg by mouth daily.    . tamsulosin (FLOMAX) 0.4 MG CAPS capsule Take 0.4 mg by mouth.    . triamcinolone cream (KENALOG) 0.1 % as needed.    . Vitamin D, Ergocalciferol, (DRISDOL) 50000 UNITS CAPS Take 50,000 Units by mouth.    Marland Kitchen. alendronate (FOSAMAX) 70 MG tablet Take 70 mg by mouth every 7 (seven) days. Reported on 01/20/2016     No current facility-administered medications for this visit.    REVIEW OF SYSTEMS:  [X]  denotes positive finding, [ ]  denotes negative finding Cardiac  Comments:  Chest pain or chest pressure:  Shortness of breath upon exertion:    Short of breath when lying flat:    Irregular heart rhythm:        Vascular    Pain in calf, thigh, or hip brought on by ambulation:    Pain in feet at night that wakes you up from your sleep:     Blood clot in your veins:    Leg swelling:         Pulmonary    Oxygen at home:    Productive cough:     Wheezing:         Neurologic    Sudden weakness in arms or legs:     Sudden numbness in arms or legs:     Sudden onset of difficulty speaking or slurred speech:    Temporary loss of vision in one eye:     Problems with dizziness:         Gastrointestinal    Blood in stool:     Vomited blood:         Genitourinary    Burning when urinating:     Blood in urine:        Psychiatric    Major depression:         Hematologic    Bleeding problems:    Problems with blood clotting too easily:         Skin    Rashes or ulcers:        Constitutional    Fever or chills:      PHYSICAL EXAM: Filed Vitals:   01/20/16 1245  BP: 64/51  Pulse: 88  Height:  (1.626 m)  Weight: 188 lb 14.4 oz (85.684 kg)  SpO2: 100%    GENERAL: The patient is a well-nourished female, in no acute distress. The vital signs are documented above. VASCULAR: 2+ radial, femoral and dorsalis pedis pulses bilaterally. PULMONARY: Nonlabored respiratory effort. ABDOMEN: Abdomen is soft and nontender. MUSCULOSKELETAL: There are no major deformities or cyanosis. NEUROLOGIC: No focal weakness or paresthesias are detected. SKIN: There are no ulcers or rashes noted. PSYCHIATRIC: The patient has a normal affect.  DATA:  CTA abdomen pelvis 01/20/2016  Enlargement of the native aneurysm sac with maximum diameter 5.8 cm. No obvious source of endoleak.  MEDICAL ISSUES: Status post endovascular repair of abdominal aortic aneurysm   There has been evidence of an enlargement of the native aneurysm sac up to 5.8 cm seen on CTA today. It is unclear where the source of her endoleak is. Her last EVAR duplex on 11/06/2015 revealed a maximum diameter of 4.5 cm. Plan for a diagnostic arteriogram to evaluate for source of endoleak. This will be scheduled at the patient's convenience. Management options were discussed including revision of her endovascular graft, open abdominal repair or observation. Treatment to be discussed pending results of arteriogram.  Maris Berger, PA-C [facility]      I have examined the patient, reviewed and agree with above.Had a long discussion with the patient and her son present. Clearly under estimation of aneurysm size with her March 2017 ultrasound. Recent evaluation for renal stone without contrast showed a greater than 5-1/2 cm aneurysm. CT today showed her maximal diameter was 5.8 which is up dramatically from maximal diameter of 4.2cm.  CTA scan  today does not show any specific source of endoleak. There may be a blush posterior to the proximal attachment site. No obvious type II endoleak's. I have recommended formal arteriogram for  further evaluation to determine if the source for endoleak and sac attention can be found. Will make further recommendations depending on these results. We'll schedule outpatient arteriogram at her convenience.  Gretta Began, MD 01/20/2016 2:01 PM

## 2016-01-21 ENCOUNTER — Other Ambulatory Visit: Payer: Self-pay

## 2016-01-23 ENCOUNTER — Encounter (HOSPITAL_COMMUNITY): Payer: Self-pay | Admitting: Emergency Medicine

## 2016-01-23 ENCOUNTER — Emergency Department (HOSPITAL_COMMUNITY)
Admission: EM | Admit: 2016-01-23 | Discharge: 2016-01-23 | Disposition: A | Discharge status: Home / Self Care | Payer: Medicare Other | Attending: Emergency Medicine | Admitting: Emergency Medicine

## 2016-01-23 ENCOUNTER — Emergency Department (HOSPITAL_COMMUNITY): Payer: Medicare Other

## 2016-01-23 DIAGNOSIS — Z87891 Personal history of nicotine dependence: Secondary | ICD-10-CM | POA: Insufficient documentation

## 2016-01-23 DIAGNOSIS — N390 Urinary tract infection, site not specified: Secondary | ICD-10-CM | POA: Diagnosis not present

## 2016-01-23 DIAGNOSIS — Z7982 Long term (current) use of aspirin: Secondary | ICD-10-CM | POA: Diagnosis not present

## 2016-01-23 DIAGNOSIS — I252 Old myocardial infarction: Secondary | ICD-10-CM | POA: Diagnosis not present

## 2016-01-23 DIAGNOSIS — R109 Unspecified abdominal pain: Secondary | ICD-10-CM | POA: Diagnosis present

## 2016-01-23 DIAGNOSIS — I714 Abdominal aortic aneurysm, without rupture: Secondary | ICD-10-CM | POA: Diagnosis not present

## 2016-01-23 DIAGNOSIS — Z79899 Other long term (current) drug therapy: Secondary | ICD-10-CM | POA: Diagnosis not present

## 2016-01-23 DIAGNOSIS — Z791 Long term (current) use of non-steroidal anti-inflammatories (NSAID): Secondary | ICD-10-CM | POA: Insufficient documentation

## 2016-01-23 LAB — URINALYSIS, ROUTINE W REFLEX MICROSCOPIC
BILIRUBIN URINE: NEGATIVE
Glucose, UA: NEGATIVE mg/dL
Ketones, ur: NEGATIVE mg/dL
NITRITE: NEGATIVE
PH: 5 (ref 5.0–8.0)
Protein, ur: NEGATIVE mg/dL
SPECIFIC GRAVITY, URINE: 1.014 (ref 1.005–1.030)

## 2016-01-23 LAB — BASIC METABOLIC PANEL
Anion gap: 7 (ref 5–15)
BUN: 10 mg/dL (ref 6–20)
CO2: 24 mmol/L (ref 22–32)
CREATININE: 0.73 mg/dL (ref 0.44–1.00)
Calcium: 9.6 mg/dL (ref 8.9–10.3)
Chloride: 109 mmol/L (ref 101–111)
GFR calc Af Amer: >60 mL/min (ref >60)
Glucose, Bld: 109 mg/dL — ABNORMAL HIGH (ref 65–99)
Potassium: 4.6 mmol/L (ref 3.5–5.1)
SODIUM: 140 mmol/L (ref 135–145)

## 2016-01-23 LAB — CBC WITH DIFFERENTIAL/PLATELET
Basophils Absolute: 0.1 10*3/uL (ref 0.0–0.1)
Basophils Relative: 1 %
Eosinophils Absolute: 0.2 10*3/uL (ref 0.0–0.7)
Eosinophils Relative: 2 %
HCT: 31.9 % — ABNORMAL LOW (ref 36.0–46.0)
Hemoglobin: 10.3 g/dL — ABNORMAL LOW (ref 12.0–15.0)
Lymphocytes Relative: 24 %
Lymphs Abs: 2.4 10*3/uL (ref 0.7–4.0)
MCH: 33.4 pg (ref 26.0–34.0)
MCHC: 32.3 g/dL (ref 30.0–36.0)
MCV: 103.6 fL — ABNORMAL HIGH (ref 78.0–100.0)
Monocytes Absolute: 0.6 10*3/uL (ref 0.1–1.0)
Monocytes Relative: 6 %
Neutro Abs: 6.6 10*3/uL (ref 1.7–7.7)
Neutrophils Relative %: 67 %
Platelets: 245 10*3/uL (ref 150–400)
RBC: 3.08 MIL/uL — ABNORMAL LOW (ref 3.87–5.11)
RDW: 15.2 % (ref 11.5–15.5)
WBC: 9.9 10*3/uL (ref 4.0–10.5)

## 2016-01-23 LAB — URINE MICROSCOPIC-ADD ON

## 2016-01-23 MED ORDER — FENTANYL CITRATE (PF) 100 MCG/2ML IJ SOLN
INTRAMUSCULAR | Status: AC
  Filled 2016-01-23: qty 2

## 2016-01-23 MED ORDER — MIDAZOLAM HCL 2 MG/2ML IJ SOLN
INTRAMUSCULAR | Status: AC
  Filled 2016-01-23: qty 4

## 2016-01-23 MED ORDER — CIPROFLOXACIN HCL 500 MG PO TABS: 500.0000 mg | ORAL_TABLET | Freq: Two times a day (BID) | ORAL | Status: DC

## 2016-01-23 MED ORDER — IOPAMIDOL (ISOVUE-370) INJECTION 76%
INTRAVENOUS | Status: AC
  Administered 2016-01-23: 80 mL
  Filled 2016-01-23: qty 100

## 2016-01-23 NOTE — ED Provider Notes (Signed)
CSN: [id number]     Arrival date & time 01/23/16  1349 History   First MD Initiated Contact with Patient 01/23/16 1520     Chief Complaint  Patient presents with  . Flank Pain     (Consider location/radiation/quality/duration/timing/severity/associated sxs/prior Treatment) Patient is a 81 y.o. female presenting with flank pain. The history is provided by the patient.  Flank Pain This is a recurrent problem. The current episode started yesterday. The problem occurs hourly. Progression since onset: waxing and waning. Nothing aggravates the symptoms. Nothing relieves the symptoms. She has tried nothing for the symptoms.    Past Medical History  Diagnosis Date  . Myocardial infarction (HCC) 1982  . Hypothyroidism   . Polymyalgia rheumatica (HCC)   . Carotid artery occlusion   . AAA (abdominal aortic aneurysm) (HCC)   . Anemia   . Arthritis     Neck   Past Surgical History  Procedure Laterality Date  . Abdominal aortic aneurysm repair  2008    Infrarenal aneurysm stent graft repair  . Appendectomy    . Tonsillectomy    . Cholecystectomy      Gall Bladder  . Carpal tunnel release Left   . Eye surgery Bilateral     Cataract  . Carotid endarterectomy  12-07-06    Left CEA   Family History  Problem Relation Age of Onset  . Heart disease Father     After age 79   . Heart attack Father   . Cancer Sister   . Diabetes Sister   . Cancer Brother   . Cancer Brother   . Heart disease Brother   . Cancer Brother     Prostate  . Diabetes Brother   . Stroke Mother   . Diabetes Mother   . Stroke Sister   . Heart disease Brother   . Cancer Paternal Grandfather   . Arthritis Brother   . Cancer Brother     Prostate  . Heart attack Sister    Social History  Substance Use Topics  . Smoking status: Former Smoker    Quit date: 08/22/1980  . Smokeless tobacco: Never Used  . Alcohol Use: No   OB History    No data available     Review of Systems  Genitourinary: Positive for  flank pain.  All other systems reviewed and are negative.     Allergies  Codeine; Crestor; Penicillins; Atorvastatin; and Cephalexin  Home Medications   Prior to Admission medications   Medication Sig Start Date End Date Taking? Authorizing Provider  ibuprofen (ADVIL,MOTRIN) 600 MG tablet Take 600 mg by mouth every 8 (eight) hours as needed. 01/10/16  Yes Historical Provider, MD  acetaminophen (TYLENOL) 500 MG tablet Take 500 mg by mouth as needed for mild pain.     Historical Provider, MD  alendronate (FOSAMAX) 70 MG tablet Take 70 mg by mouth every 7 (seven) days. Reported on 01/20/2016    Historical Provider, MD  aspirin EC 81 MG tablet Take 81 mg by mouth daily.    Historical Provider, MD  calcium carbonate 200 MG capsule Take 250 mg by mouth daily.    Historical Provider, MD  cyanocobalamin (,VITAMIN B-12,) 1000 MCG/ML injection Inject 1,000 mcg into the muscle every 30 (thirty) days.  04/14/15   Historical Provider, MD  ferrous sulfate 325 (65 FE) MG tablet Take 325 mg by mouth daily with breakfast.    Historical Provider, MD  levothyroxine (SYNTHROID) 112 MCG tablet Take 112 mcg by mouth daily.  Patient alternates with 100mcg every other day 06/03/14   Historical Provider, MD  meclizine (ANTIVERT) 25 MG tablet Take 25 mg by mouth as needed for dizziness.  03/17/14   Historical Provider, MD  predniSONE (DELTASONE) 5 MG tablet Take 5 mg by mouth daily.    Historical Provider, MD  SYNTHROID 100 MCG tablet 100 mcg every other day. Alternating with 112 mcg 01/05/16   Historical Provider, MD  tamsulosin (FLOMAX) 0.4 MG CAPS capsule Take 0.4 mg by mouth daily after supper.  01/10/16 02/09/16  Historical Provider, MD  triamcinolone cream (KENALOG) 0.1 % Apply 1 application topically as needed.  02/20/14   Historical Provider, MD  Vitamin D, Ergocalciferol, (DRISDOL) 50000 UNITS CAPS Take 50,000 Units by mouth.    Historical Provider, MD   BP 95/65 mmHg  Pulse 76  Temp(Src) 97.8 F (36.6 C) (Oral)   Resp 13  Ht 5\' 6"  (1.676 m)  Wt 198 lb (89.812 kg)  BMI 31.97 kg/m2  SpO2 100% Physical Exam  Constitutional: She is oriented to person, place, and time. She appears well-developed and well-nourished. No distress.  HENT:  Head: Normocephalic.  Eyes: Conjunctivae are normal.  Neck: Neck supple. No tracheal deviation present.  Cardiovascular: Normal rate, regular rhythm and normal heart sounds.   Pulmonary/Chest: Effort normal and breath sounds normal. No respiratory distress.  Abdominal: Soft. She exhibits no distension. There is no tenderness. There is no rebound and no guarding.  Neurological: She is alert and oriented to person, place, and time.  Skin: Skin is warm and dry.  Psychiatric: She has a normal mood and affect.  Vitals reviewed.   ED Course  Procedures (including critical care time)  Emergency Focused Ultrasound Exam Limited Ultrasound of the Abdomen   Performed and interpreted by Dr. Clydene PughKnott Indication: abdominal pain, known aneurysm Multiple views of the abdomen are obtained with a multi-frequency probe. Findings: no anechoic fluid in abdomen Interpretation: no hemoperitoneum Images archived electronically.  CPT Codes: abdomen [id number]  Emergency Focused Ultrasound Exam Limited Retroperitoneal Ultrasound of the Abdominal Aorta.   Performed and interpreted by Dr. Clydene PughKnott Indication: abdominal pain Multiple views of the abdominal aorta are obtained from the diaphragmatic hiatus to the aortic bifurcation in transverse and sagittal planes with a multi-frequency probe.  Findings: largest dimensions 5.12 x 5.66 cm, anechoic inside graft outside of area of flow noted without anechoic area surrounding or flow into flap Interpretation: + abdominal aortic aneurysm, graft pseudoaneurysm, no signs of impending rupture, possible small volume endoleak, study limited Images archived electronically. CPT Code: 5621376775  Emergency Focused Ultrasound Exam Limited Retroperitoneal  Ultrasound of Kidneys  Performed and interpreted by Dr. Clydene PughKnott Focused abdominal ultrasound with both kidneys imaged in transverse and longitudinal planes in real-time. Indication: flank pain Findings: bilateral kidneys present, no shadowing, no anechoic areas Interpretation: no hydronephrosis visualized.  no stones or cysts visualized  Images archived electronically  CPT Code: [id number]   Labs Review Labs Reviewed  URINALYSIS, ROUTINE W REFLEX MICROSCOPIC (NOT [facility]) - Abnormal; Notable for the following:    Hgb urine dipstick SMALL (*)    Leukocytes, UA MODERATE (*)    All other components within normal limits  URINE MICROSCOPIC-ADD ON - Abnormal; Notable for the following:    Squamous Epithelial / LPF 0-5 (*)    Bacteria, UA RARE (*)    All other components within normal limits  CBC WITH DIFFERENTIAL/PLATELET - Abnormal; Notable for the following:    RBC 3.08 (*)    Hemoglobin 10.3 (*)  HCT 31.9 (*)    MCV 103.6 (*)    All other components within normal limits  BASIC METABOLIC PANEL - Abnormal; Notable for the following:    Glucose, Bld 109 (*)    All other components within normal limits  URINE CULTURE    Imaging Review Ct Cta Abd/pel W/cm &/or W/o Cm  01/23/2016  CLINICAL DATA:  Worsening left flank pain. Abdominal aortic aneurysm stent graft endoleak. Left ureteral calculus. EXAM: CTA ABDOMEN AND PELVIS WITH CONTRAST TECHNIQUE: Multidetector CT imaging of the abdomen and pelvis was performed using the standard protocol during bolus administration of intravenous contrast. Multiplanar reconstructed images and MIPs were obtained and reviewed to evaluate the vascular anatomy. CONTRAST:  80 mL Isovue 370 COMPARISON:  01/20/2016 FINDINGS: Vascular: Endovascular stent graft repair of abdominal aortic aneurysm is again seen. Endovascular stent graft is widely patent, with normal runoff into the iliac arteries bilaterally. There is a persistent endoleak arising from the posterior wall  of the endograft just above the iliac limbs, with contrast pooling in the posterior portion of the native aneurysm sac. This is unchanged in appearance since previous study. Native abdominal aortic aneurysm measures 5.8 x 5.1 cm and is stable in size compared with prior study. No evidence of retroperitoneal hemorrhage. Nonvascular: Lower Chest: No acute findings. Hepatobiliary: No masses or other significant abnormality identified. Prior cholecystectomy noted. No evidence of biliary dilatation. Pancreas: No mass, inflammatory changes, or other significant abnormality identified. Spleen:  Within normal limits in size and appearance. Adrenals/Urinary Tract: Normal adrenal glands. A 3 mm nonobstructive calculus is noted in lower pole of left kidney. Moderate left hydronephrosis and ureterectasis is increased since previous study. Ureteral dilatation is seen to the level of the left ureterovesical junction where there is an obstructing 6 mm calculus. Stomach/Bowel/Peritoneum: No evidence of wall thickening or obstruction. No evidence of inflammatory process or abnormal fluid collections. Diverticulosis is again seen involving the sigmoid colon, however there is no evidence of diverticulitis. Small hiatal hernia again noted. Lymphatic:  No pathologically enlarged lymph nodes identified. Reproductive:  No mass or other significant abnormality identified. Other:  None. Musculoskeletal: No suspicious bone lesions identified. Old fracture deformity of left pubis again noted. Review of the MIP images confirms the above findings. IMPRESSION: Increased moderate left hydroureteronephrosis, due to 6 mm obstructing calculus at the left ureterovesical junction. Unchanged appearance of endoleak (suspect type 3) from posterior wall of abdominal aortic endograft compared to recent CTA. Stable size of native abdominal aortic aneurysm measuring 5.8 cm in maximum diameter. Electronically Signed   By: Myles Rosenthal M.D.   On: 01/23/2016  19:02   I have personally reviewed and evaluated these images and lab results as part of my medical decision-making.   EKG Interpretation None      MDM   Final diagnoses:  Complicated urinary tract infection   81 y.o. female presents with left flank pain. There are leuks in urine concerning for recurrent UTI for which she completed a course of clinda recently for GBS infection. This is complicated with known endoleak of aorto-bifem graft. D/w vascular surgery who recommended CT Angio which was unchanged from prior and they cleared for discharge and OP f/u of aneurysm. There is a left 6mm obstructing stone which is likely complicating UTI course. Small amount of hydro evident on CT. Pt provided cipro with plan to f/u urology as she does not appear septic and her pain is not out of control here. Plan to follow up with PCP as  needed and return precautions discussed for worsening or new concerning symptoms.     Lyndal Pulley, MD 01/24/16 0100

## 2016-01-23 NOTE — ED Notes (Signed)
Pt. Stated, I had a kidney stone which has not passed.  I started having left flank pain again.  I also have an annursym in my stomach that was diagnosed buy Dr. Arbie CookeyEarly. It concerns me because they are the same symptoms.

## 2016-01-23 NOTE — Discharge Instructions (Signed)

## 2016-01-23 NOTE — ED Notes (Signed)
Patient returned from CT

## 2016-01-23 NOTE — ED Notes (Signed)
Patient transported to CT 

## 2016-01-23 NOTE — Consult Note (Signed)
Referring Physician: Dr Lajuana MatteKnott [place] Patient name: Margaret Castillo MRN: [id number] DOB: 07/24/1935 Sex: female  REASON FOR CONSULT: AAA stent graft with endoleak and flank pain  HPI: Margaret Castillo is a 81 y.o. female, with known endoleak with aneurysm expansion seen by my partner Dr Arbie CookeyEarly last week.  She was scheduled for angiogram in 10 days but came to ER today with worsening flank pain.  She also has a known left renal stone. No syncope or collapse pain is tolerable but worse than 3 days ago.  Past Medical History  Diagnosis Date  . Myocardial infarction (HCC) 1982  . Hypothyroidism   . Polymyalgia rheumatica (HCC)   . Carotid artery occlusion   . AAA (abdominal aortic aneurysm) (HCC)   . Anemia   . Arthritis     Neck   Past Surgical History  Procedure Laterality Date  . Abdominal aortic aneurysm repair  2008    Infrarenal aneurysm stent graft repair  . Appendectomy    . Tonsillectomy    . Cholecystectomy      Gall Bladder  . Carpal tunnel release Left   . Eye surgery Bilateral     Cataract  . Carotid endarterectomy  12-07-06    Left CEA    Family History  Problem Relation Age of Onset  . Heart disease Father     After age 79   . Heart attack Father   . Cancer Sister   . Diabetes Sister   . Cancer Brother   . Cancer Brother   . Heart disease Brother   . Cancer Brother     Prostate  . Diabetes Brother   . Stroke Mother   . Diabetes Mother   . Stroke Sister   . Heart disease Brother   . Cancer Paternal Grandfather   . Arthritis Brother   . Cancer Brother     Prostate  . Heart attack Sister     SOCIAL HISTORY: Social History   Social History  . Marital Status: Married    Spouse Name: N/A  . Number of Children: N/A  . Years of Education: N/A   Occupational History  . Not on file.   Social History Main Topics  . Smoking status: Former Smoker    Quit date: 08/22/1980  . Smokeless tobacco: Never Used  . Alcohol Use: No  . Drug Use: No  .  Sexual Activity: Not on file   Other Topics Concern  . Not on file   Social History Narrative    Allergies  Allergen Reactions  . Codeine Nausea Only  . Crestor [Rosuvastatin] Other (See Comments)    Leg pain/ joint pain  ( ALL THE STATIN's )  . Penicillins Rash    Has patient had a PCN reaction causing immediate rash, facial/tongue/throat swelling, SOB or lightheadedness with hypotension: Yes Has patient had a PCN reaction causing severe rash involving mucus membranes or skin necrosis: No Has patient had a PCN reaction that required hospitalization No Has patient had a PCN reaction occurring within the last 10 years: No If all of the above answers are "NO", then may proceed with Cephalosporin use.   . Atorvastatin     Other reaction(s): MUSCLE PAIN (all statins)  . Cephalexin Rash    Current Facility-Administered Medications  Medication Dose Route Frequency Provider Last Rate Last Dose  . fentaNYL (SUBLIMAZE) 100 MCG/2ML injection           . midazolam (VERSED) 2 MG/2ML injection  Current Outpatient Prescriptions  Medication Sig Dispense Refill  . ibuprofen (ADVIL,MOTRIN) 600 MG tablet Take 600 mg by mouth every 8 (eight) hours as needed.    Marland Kitchen acetaminophen (TYLENOL) 500 MG tablet Take 500 mg by mouth as needed for mild pain.     Marland Kitchen alendronate (FOSAMAX) 70 MG tablet Take 70 mg by mouth every 7 (seven) days. Reported on 01/20/2016    . aspirin EC 81 MG tablet Take 81 mg by mouth daily.    . calcium carbonate 200 MG capsule Take 250 mg by mouth daily.    . cyanocobalamin (,VITAMIN B-12,) 1000 MCG/ML injection Inject 1,000 mcg into the muscle every 30 (thirty) days.     . ferrous sulfate 325 (65 FE) MG tablet Take 325 mg by mouth daily with breakfast.    . levothyroxine (SYNTHROID) 112 MCG tablet Take 112 mcg by mouth daily. Patient alternates with every other day    . meclizine (ANTIVERT) 25 MG tablet Take 25 mg by mouth as needed for dizziness.     .  predniSONE (DELTASONE) 5 MG tablet Take 5 mg by mouth daily.    Marland Kitchen SYNTHROID 100 MCG tablet 100 mcg every other day. Alternating with 112 mcg  2  . tamsulosin (FLOMAX) 0.4 MG CAPS capsule Take 0.4 mg by mouth daily after supper.     . triamcinolone cream (KENALOG) 0.1 % Apply 1 application topically as needed.     . Vitamin D, Ergocalciferol, (DRISDOL) 50000 UNITS CAPS Take 50,000 Units by mouth.      ROS:   General:  No weight loss, Fever, chills  Cardiac: No recent episodes of chest pain/pressure, no shortness of breath at rest.  No shortness of breath with exertion.  Denies history of atrial fibrillation or irregular heartbeat  Pulmonary: No home oxygen, no productive cough, no hemoptysis,  No asthma or wheezing    Physical Examination  Filed Vitals:   01/23/16 1824 01/23/16 1830 01/23/16 1845 01/23/16 1900  BP:  106/70 98/60 90/62   Pulse: 79 71 72 74  Temp:      TempSrc:      Resp: Height:      Weight:      SpO2: 98% 97% 100% 100%    Body mass index is 31.97 kg/(m^2).  General:  Alert and oriented, no acute distress HEENT: Normal Abdomen: Soft, non-tender, non-distended   DATA:  CTA images reviewed and discussed with radiology endoleak persists but no change no evidence of aorta rupture Has left ureteral stone with hydro  ASSESSMENT:  AAA with endoleak unchanged. Angio as scheduled.  Renal stone RX per ER discussed with Dr Darcus Austin, MD [facility] [place] Office: [phone] Pager: [phone]

## 2016-01-24 LAB — URINE CULTURE: Culture: 7000 — AB

## 2016-02-03 ENCOUNTER — Encounter (HOSPITAL_COMMUNITY)
Admission: AD | Disposition: A | Discharge status: Home / Self Care | Payer: Self-pay | Source: Ambulatory Visit | Attending: Vascular Surgery

## 2016-02-03 ENCOUNTER — Inpatient Hospital Stay (HOSPITAL_COMMUNITY)
Admission: AD | Admit: 2016-02-03 | Discharge: 2016-02-04 | DRG: 316 | Disposition: A | Discharge status: Home / Self Care | Payer: Medicare Other | Source: Ambulatory Visit | Attending: Vascular Surgery | Admitting: Vascular Surgery

## 2016-02-03 DIAGNOSIS — I6529 Occlusion and stenosis of unspecified carotid artery: Secondary | ICD-10-CM | POA: Diagnosis present

## 2016-02-03 DIAGNOSIS — R55 Syncope and collapse: Secondary | ICD-10-CM | POA: Diagnosis present

## 2016-02-03 DIAGNOSIS — Z888 Allergy status to other drugs, medicaments and biological substances status: Secondary | ICD-10-CM

## 2016-02-03 DIAGNOSIS — Z87891 Personal history of nicotine dependence: Secondary | ICD-10-CM

## 2016-02-03 DIAGNOSIS — Z881 Allergy status to other antibiotic agents status: Secondary | ICD-10-CM

## 2016-02-03 DIAGNOSIS — M353 Polymyalgia rheumatica: Secondary | ICD-10-CM | POA: Diagnosis present

## 2016-02-03 DIAGNOSIS — Z79899 Other long term (current) drug therapy: Secondary | ICD-10-CM | POA: Diagnosis not present

## 2016-02-03 DIAGNOSIS — E039 Hypothyroidism, unspecified: Secondary | ICD-10-CM | POA: Diagnosis present

## 2016-02-03 DIAGNOSIS — Z7982 Long term (current) use of aspirin: Secondary | ICD-10-CM

## 2016-02-03 DIAGNOSIS — Z7952 Long term (current) use of systemic steroids: Secondary | ICD-10-CM

## 2016-02-03 DIAGNOSIS — Y838 Other surgical procedures as the cause of abnormal reaction of the patient, or of later complication, without mention of misadventure at the time of the procedure: Secondary | ICD-10-CM | POA: Diagnosis present

## 2016-02-03 DIAGNOSIS — Z885 Allergy status to narcotic agent status: Secondary | ICD-10-CM | POA: Diagnosis not present

## 2016-02-03 DIAGNOSIS — I252 Old myocardial infarction: Secondary | ICD-10-CM

## 2016-02-03 DIAGNOSIS — T82898A Other specified complication of vascular prosthetic devices, implants and grafts, initial encounter: Secondary | ICD-10-CM | POA: Diagnosis present

## 2016-02-03 DIAGNOSIS — R1084 Generalized abdominal pain: Secondary | ICD-10-CM

## 2016-02-03 DIAGNOSIS — Z880 Allergy status to penicillin: Secondary | ICD-10-CM

## 2016-02-03 HISTORY — PX: PERIPHERAL VASCULAR CATHETERIZATION: SHX172C

## 2016-02-03 LAB — URINALYSIS, ROUTINE W REFLEX MICROSCOPIC
Bilirubin Urine: NEGATIVE
Glucose, UA: NEGATIVE mg/dL
HGB URINE DIPSTICK: NEGATIVE
KETONES UR: NEGATIVE mg/dL
Nitrite: NEGATIVE
PROTEIN: NEGATIVE mg/dL
Specific Gravity, Urine: 1.02 (ref 1.005–1.030)
pH: 6 (ref 5.0–8.0)

## 2016-02-03 LAB — POCT I-STAT, CHEM 8
BUN: 8 mg/dL (ref 6–20)
CHLORIDE: 109 mmol/L (ref 101–111)
Calcium, Ion: 1.18 mmol/L (ref 1.13–1.30)
Creatinine, Ser: 0.6 mg/dL (ref 0.44–1.00)
Glucose, Bld: 92 mg/dL (ref 65–99)
HCT: 33 % — ABNORMAL LOW (ref 36.0–46.0)
HEMOGLOBIN: 11.2 g/dL — AB (ref 12.0–15.0)
Potassium: 3.5 mmol/L (ref 3.5–5.1)
Sodium: 142 mmol/L (ref 135–145)
TCO2: 23 mmol/L (ref 0–100)

## 2016-02-03 LAB — CBC
HEMATOCRIT: 28.2 % — AB (ref 36.0–46.0)
HEMOGLOBIN: 9 g/dL — AB (ref 12.0–15.0)
MCH: 32.8 pg (ref 26.0–34.0)
MCHC: 31.9 g/dL (ref 30.0–36.0)
MCV: 102.9 fL — ABNORMAL HIGH (ref 78.0–100.0)
Platelets: 231 10*3/uL (ref 150–400)
RBC: 2.74 MIL/uL — ABNORMAL LOW (ref 3.87–5.11)
RDW: 15.9 % — ABNORMAL HIGH (ref 11.5–15.5)
WBC: 8.1 10*3/uL (ref 4.0–10.5)

## 2016-02-03 LAB — BASIC METABOLIC PANEL
ANION GAP: 7 (ref 5–15)
BUN: 7 mg/dL (ref 6–20)
CHLORIDE: 111 mmol/L (ref 101–111)
CO2: 23 mmol/L (ref 22–32)
Calcium: 8.7 mg/dL — ABNORMAL LOW (ref 8.9–10.3)
Creatinine, Ser: 0.55 mg/dL (ref 0.44–1.00)
GFR calc non Af Amer: >60 mL/min (ref >60)
Glucose, Bld: 106 mg/dL — ABNORMAL HIGH (ref 65–99)
POTASSIUM: 3.8 mmol/L (ref 3.5–5.1)
Sodium: 141 mmol/L (ref 135–145)

## 2016-02-03 LAB — URINE MICROSCOPIC-ADD ON

## 2016-02-03 SURGERY — ABDOMINAL AORTOGRAM
Anesthesia: LOCAL

## 2016-02-03 MED ORDER — SODIUM CHLORIDE 0.9 % IV SOLN: 1.0000 mL/kg/h | INTRAVENOUS | Status: DC

## 2016-02-03 MED ORDER — LABETALOL HCL 5 MG/ML IV SOLN
INTRAVENOUS | Status: AC
Start: 2016-02-03 — End: 2016-02-03
  Filled 2016-02-03: qty 4

## 2016-02-03 MED ORDER — HEPARIN (PORCINE) IN NACL 2-0.9 UNIT/ML-% IJ SOLN
INTRAMUSCULAR | Status: AC
  Filled 2016-02-03: qty 1000

## 2016-02-03 MED ORDER — PANTOPRAZOLE SODIUM 40 MG PO TBEC: 40.0000 mg | DELAYED_RELEASE_TABLET | Freq: Every day | ORAL | Status: DC

## 2016-02-03 MED ORDER — POTASSIUM CHLORIDE CRYS ER 20 MEQ PO TBCR
20.0000 meq | EXTENDED_RELEASE_TABLET | Freq: Once | ORAL | Status: AC
  Administered 2016-02-03: 20 meq via ORAL
  Filled 2016-02-03: qty 1

## 2016-02-03 MED ORDER — HYDRALAZINE HCL 20 MG/ML IJ SOLN: 5.0000 mg | INTRAMUSCULAR | Status: DC | PRN

## 2016-02-03 MED ORDER — PHENOL 1.4 % MT LIQD: 1.0000 | OROMUCOSAL | Status: DC | PRN

## 2016-02-03 MED ORDER — ENOXAPARIN SODIUM 40 MG/0.4ML ~~LOC~~ SOLN
40.0000 mg | SUBCUTANEOUS | Status: DC
  Filled 2016-02-03: qty 0.4

## 2016-02-03 MED ORDER — MAGNESIUM SULFATE 2 GM/50ML IV SOLN
2.0000 g | Freq: Every day | INTRAVENOUS | Status: DC | PRN
  Filled 2016-02-03: qty 50

## 2016-02-03 MED ORDER — HEPARIN (PORCINE) IN NACL 2-0.9 UNIT/ML-% IJ SOLN
INTRAMUSCULAR | Status: DC | PRN
  Administered 2016-02-03: 1000 mL

## 2016-02-03 MED ORDER — LIDOCAINE HCL (PF) 1 % IJ SOLN
INTRAMUSCULAR | Status: AC
Start: 2016-02-03 — End: 2016-02-03
  Filled 2016-02-03: qty 30

## 2016-02-03 MED ORDER — ONDANSETRON HCL 4 MG/2ML IJ SOLN: 4.0000 mg | Freq: Four times a day (QID) | INTRAMUSCULAR | Status: DC | PRN

## 2016-02-03 MED ORDER — ALUM & MAG HYDROXIDE-SIMETH 200-200-20 MG/5ML PO SUSP
15.0000 mL | ORAL | Status: DC | PRN
Start: 2016-02-03 — End: 2016-02-04

## 2016-02-03 MED ORDER — POTASSIUM CHLORIDE CRYS ER 20 MEQ PO TBCR: 20.0000 meq | EXTENDED_RELEASE_TABLET | Freq: Every day | ORAL | Status: DC | PRN

## 2016-02-03 MED ORDER — SODIUM CHLORIDE 0.9 % IV SOLN
INTRAVENOUS | Status: DC
  Administered 2016-02-03: 11:00:00 via INTRAVENOUS

## 2016-02-03 MED ORDER — ALUM & MAG HYDROXIDE-SIMETH 200-200-20 MG/5ML PO SUSP: 15.0000 mL | ORAL | Status: DC | PRN

## 2016-02-03 MED ORDER — GUAIFENESIN-DM 100-10 MG/5ML PO SYRP: 15.0000 mL | ORAL_SOLUTION | ORAL | Status: DC | PRN

## 2016-02-03 MED ORDER — IODIXANOL 320 MG/ML IV SOLN
INTRAVENOUS | Status: DC | PRN
  Administered 2016-02-03: 120 mL via INTRAVENOUS

## 2016-02-03 MED ORDER — DOCUSATE SODIUM 100 MG PO CAPS
100.0000 mg | ORAL_CAPSULE | Freq: Every day | ORAL | Status: DC
  Filled 2016-02-03: qty 1

## 2016-02-03 MED ORDER — METOPROLOL TARTRATE 5 MG/5ML IV SOLN: 2.0000 mg | INTRAVENOUS | Status: DC | PRN

## 2016-02-03 MED ORDER — ACETAMINOPHEN 325 MG PO TABS: 325.0000 mg | ORAL_TABLET | ORAL | Status: DC | PRN

## 2016-02-03 MED ORDER — LABETALOL HCL 5 MG/ML IV SOLN
INTRAVENOUS | Status: DC | PRN
  Administered 2016-02-03: 10 mg via INTRAVENOUS

## 2016-02-03 MED ORDER — SODIUM CHLORIDE 0.9 % IV SOLN
INTRAVENOUS | Status: DC
  Administered 2016-02-03: 23:00:00 via INTRAVENOUS

## 2016-02-03 MED ORDER — ACETAMINOPHEN 325 MG RE SUPP: 325.0000 mg | RECTAL | Status: DC | PRN

## 2016-02-03 MED ORDER — ONDANSETRON HCL 4 MG/2ML IJ SOLN
INTRAMUSCULAR | Status: AC
  Administered 2016-02-03: 4 mg via INTRAVENOUS
  Filled 2016-02-03: qty 2

## 2016-02-03 MED ORDER — LABETALOL HCL 5 MG/ML IV SOLN: 10.0000 mg | INTRAVENOUS | Status: DC | PRN

## 2016-02-03 MED ORDER — LIDOCAINE HCL (PF) 1 % IJ SOLN
INTRAMUSCULAR | Status: DC | PRN
  Administered 2016-02-03: 12 mL

## 2016-02-03 MED ORDER — ONDANSETRON HCL 4 MG/2ML IJ SOLN
4.0000 mg | Freq: Once | INTRAMUSCULAR | Status: AC
  Administered 2016-02-03: 4 mg via INTRAVENOUS

## 2016-02-03 MED ORDER — DEXTROSE 5 % IV SOLN: 1.5000 g | Freq: Two times a day (BID) | INTRAVENOUS | Status: DC

## 2016-02-03 MED ORDER — PANTOPRAZOLE SODIUM 40 MG PO TBEC
40.0000 mg | DELAYED_RELEASE_TABLET | Freq: Every day | ORAL | Status: DC
  Administered 2016-02-03: 40 mg via ORAL
  Filled 2016-02-03 (×2): qty 1

## 2016-02-03 SURGICAL SUPPLY — 7 items
CATH ANGIO 5F PIGTAIL 65CM (CATHETERS) ×1 IMPLANT
KIT PV (KITS) ×3 IMPLANT
SHEATH PINNACLE 5F 10CM (SHEATH) ×3 IMPLANT
SYR MEDRAD MARK V 150ML (SYRINGE) ×3 IMPLANT
TRANSDUCER W/STOPCOCK (MISCELLANEOUS) ×3 IMPLANT
TRAY PV CATH (CUSTOM PROCEDURE TRAY) ×3 IMPLANT
WIRE HITORQ VERSACORE ST 145CM (WIRE) ×1 IMPLANT

## 2016-02-03 NOTE — Progress Notes (Signed)
See progress note.

## 2016-02-03 NOTE — Op Note (Signed)
Vascular Surgery H&P  Chief Complaint: Patient developed short duration syncope and nausea and vomiting following ambulation. Patient had angiogram performed earlier today.  HPI: Margaret Castillo is a 81 y.o. female who presents for evaluation of had angiogram performed earlier today for aortic stent graft endoleak. Patient was ambulating prior to anticipated discharge and developed episode of short duration syncope with nausea vomiting which then resolved. Patient feels weak. Denies chest pain, dyspnea, or other symptoms. Denies pain.   Past Medical History  Diagnosis Date  . Myocardial infarction (HCC) 1982  . Hypothyroidism   . Polymyalgia rheumatica (HCC)   . Carotid artery occlusion   . AAA (abdominal aortic aneurysm) (HCC)   . Anemia   . Arthritis     Neck   Past Surgical History  Procedure Laterality Date  . Abdominal aortic aneurysm repair  2008    Infrarenal aneurysm stent graft repair  . Appendectomy    . Tonsillectomy    . Cholecystectomy      Gall Bladder  . Carpal tunnel release Left   . Eye surgery Bilateral     Cataract  . Carotid endarterectomy  12-07-06    Left CEA   Social History   Social History  . Marital Status: Married    Spouse Name: N/A  . Number of Children: N/A  . Years of Education: N/A   Social History Main Topics  . Smoking status: Former Smoker    Quit date: 08/22/1980  . Smokeless tobacco: Never Used  . Alcohol Use: No  . Drug Use: No  . Sexual Activity: Not on file   Other Topics Concern  . Not on file   Social History Narrative   Family History  Problem Relation Age of Onset  . Heart disease Father     After age 79   . Heart attack Father   . Cancer Sister   . Diabetes Sister   . Cancer Brother   . Cancer Brother   . Heart disease Brother   . Cancer Brother     Prostate  . Diabetes Brother   . Stroke Mother   . Diabetes Mother   . Stroke Sister   . Heart disease Brother   . Cancer Paternal Grandfather   .  Arthritis Brother   . Cancer Brother     Prostate  . Heart attack Sister    Allergies  Allergen Reactions  . Codeine Nausea Only  . Crestor [Rosuvastatin] Other (See Comments)    Leg pain/ joint pain  ( ALL THE STATIN's )  . Penicillins Rash    Has patient had a PCN reaction causing immediate rash, facial/tongue/throat swelling, SOB or lightheadedness with hypotension: Yes Has patient had a PCN reaction causing severe rash involving mucus membranes or skin necrosis: No Has patient had a PCN reaction that required hospitalization No Has patient had a PCN reaction occurring within the last 10 years: No If all of the above answers are "NO", then may proceed with Cephalosporin use.   . Atorvastatin     Other reaction(s): MUSCLE PAIN (all statins)  . Cephalexin Rash   Prior to Admission medications   Medication Sig Start Date End Date Taking? Authorizing Provider  acetaminophen (TYLENOL) 500 MG tablet Take 500 mg by mouth as needed for mild pain.    Yes Historical Provider, MD  aspirin EC 81 MG tablet Take 81 mg by mouth daily.   Yes Historical Provider, MD  ciprofloxacin (CIPRO) 500 MG tablet Take 1 tablet (  500 mg total) by mouth 2 (two) times daily. 01/23/16  Yes Lyndal Pulley, MD  cyanocobalamin (,VITAMIN B-12,) 1000 MCG/ML injection Inject 1,000 mcg into the muscle every 30 (thirty) days.  04/14/15  Yes Historical Provider, MD  ferrous sulfate 325 (65 FE) MG tablet Take 325 mg by mouth daily with breakfast.   Yes Historical Provider, MD  ibuprofen (ADVIL,MOTRIN) 600 MG tablet Take 600 mg by mouth every 8 (eight) hours as needed. 01/10/16  Yes Historical Provider, MD  levothyroxine (SYNTHROID) 112 MCG tablet Take 112 mcg by mouth daily. Patient alternates with every other day 06/03/14  Yes Historical Provider, MD  meclizine (ANTIVERT) 25 MG tablet Take 12.5 mg by mouth 2 (two) times daily as needed for dizziness.  03/17/14  Yes Historical Provider, MD  predniSONE (DELTASONE) 5 MG tablet  Take 5 mg by mouth daily.   Yes Historical Provider, MD  SYNTHROID 100 MCG tablet 100 mcg every other day. Alternating with 112 mcg 01/05/16  Yes Historical Provider, MD  tamsulosin (FLOMAX) 0.4 MG CAPS capsule Take 0.4 mg by mouth daily after supper.  01/10/16 02/09/16 Yes Historical Provider, MD  triamcinolone cream (KENALOG) 0.1 % Apply 1 application topically as needed.  02/20/14  Yes Historical Provider, MD  Vitamin D, Ergocalciferol, (DRISDOL) 50000 UNITS CAPS Take 50,000 Units by mouth every 7 (seven) days.    Yes Historical Provider, MD     Positive ROS:   All other systems have been reviewed and were otherwise negative with the exception of those mentioned in the HPI and as above.  Physical Exam: Filed Vitals:   02/03/16 1716 02/03/16 1745  BP: 145/52 141/64  Pulse: 63 62  Temp:    Resp:      General: Alert, no acute distress HEENT: Normal for age Cardiovascular: Regular rate and rhythm. Carotid pulses 2+, no bruits audible Respiratory: Clear to auscultation. No cyanosis, no use of accessory musculature GI: No organomegaly, abdomen is soft and non-tender Skin: No lesions in the area of chief complaint Neurologic: Sensation intact distally Psychiatric: Patient is competent for consent with normal mood and affect Musculoskeletal: No obvious deformities Extremities: 3+ femoral and dorsalis pedis pulse palpable bilaterally. No evidence of hematoma in the right inguinal area where puncture site was located.  Labs reviewed: Pending     Assessment/Plan:  Because of patient's age and frail condition will admit overnight for observation with IV fluids and anti-emetics as necessary Anticipate discharge home in a.m. by Dr. early Patient states she is feeling much better now and nausea is resolving   Josephina Gip, MD 02/03/2016 7:26 PM

## 2016-02-03 NOTE — Progress Notes (Signed)
I ambulated pt to restroom, after voiding pt said she was nauseated, I sat her on bedside commode that was in the restroom, she said she was dizzy and she became unresponsive and slumped over. I steadied her position on the commode and called for help, Joesph JulyBridget Rn from surgical and Jasmine DecemberSharon tech assisted. Pt was pale and unresponsive for 3-5 minutes with weak pulse. Remained unresponsive while in the bathroom  with sternum stimuli, the chair was dragged to the hallway and pt was returned to a stretcher when she regained consciousness . No change in right groin access site. Level 1 due to bruising. Dr Hart RochesterLawson was paged orders followed and he saw the pt. Pt was monitored, VSS, pt continues to vomit small amounts of watery substance. Pt to be admitted.

## 2016-02-03 NOTE — Discharge Instructions (Signed)
Angiogram, Care After °Refer to this sheet in the next few weeks. These instructions provide you with information about caring for yourself after your procedure. Your health care provider may also give you more specific instructions. Your treatment has been planned according to current medical practices, but problems sometimes occur. Call your health care provider if you have any problems or questions after your procedure. °WHAT TO EXPECT AFTER THE PROCEDURE °After your procedure, it is typical to have the following: °· Bruising at the catheter insertion site that usually fades within 1-2 weeks. °· Blood collecting in the tissue (hematoma) that may be painful to the touch. It should usually decrease in size and tenderness within 1-2 weeks. °HOME CARE INSTRUCTIONS °· Take medicines only as directed by your health care provider. °· You may shower 24-48 hours after the procedure or as directed by your health care provider. Remove the bandage (dressing) and gently wash the site with plain soap and water. Pat the area dry with a clean towel. Do not rub the site, because this may cause bleeding. °· Do not take baths, swim, or use a hot tub until your health care provider approves. °· Check your insertion site every day for redness, swelling, or drainage. °· Do not apply powder or lotion to the site. °· Do not lift over 10 lb (4.5 kg) for 5 days after your procedure or as directed by your health care provider. °· Ask your health care provider when it is okay to: °¨ Return to work or school. °¨ Resume usual physical activities or sports. °¨ Resume sexual activity. °· Do not drive home if you are discharged the same day as the procedure. Have someone else drive you. °· You may drive 24 hours after the procedure unless otherwise instructed by your health care provider. °· Do not operate machinery or power tools for 24 hours after the procedure or as directed by your health care provider. °· If your procedure was done as an  outpatient procedure, which means that you went home the same day as your procedure, a responsible adult should be with you for the first 24 hours after you arrive home. °· Keep all follow-up visits as directed by your health care provider. This is important. °SEEK MEDICAL CARE IF: °· You have a fever. °· You have chills. °· You have increased bleeding from the catheter insertion site. Hold pressure on the site.  CALL 911 °SEEK IMMEDIATE MEDICAL CARE IF: °· You have unusual pain at the catheter insertion site. °· You have redness, warmth, or swelling at the catheter insertion site. °· You have drainage (other than a small amount of blood on the dressing) from the catheter insertion site. °· The catheter insertion site is bleeding, and the bleeding does not stop after 30 minutes of holding steady pressure on the site. °· The area near or just beyond the catheter insertion site becomes pale, cool, tingly, or numb. °  °This information is not intended to replace advice given to you by your health care provider. Make sure you discuss any questions you have with your health care provider. °  °Document Released: 02/24/2005 Document Revised: 08/29/2014 Document Reviewed: 01/09/2013 °Elsevier Interactive Patient Education ©2016 [employer] ° °

## 2016-02-03 NOTE — Progress Notes (Signed)
ANTICOAGULATION CONSULT NOTE - Initial Consult  Pharmacy Consult for Lovenox Indication: VTE prophylaxis  Allergies  Allergen Reactions  . Codeine Nausea Only  . Crestor [Rosuvastatin] Other (See Comments)    Leg pain/ joint pain  ( ALL THE STATIN's )  . Penicillins Rash    Has patient had a PCN reaction causing immediate rash, facial/tongue/throat swelling, SOB or lightheadedness with hypotension: Yes Has patient had a PCN reaction causing severe rash involving mucus membranes or skin necrosis: No Has patient had a PCN reaction that required hospitalization No Has patient had a PCN reaction occurring within the last 10 years: No If all of the above answers are "NO", then may proceed with Cephalosporin use.   . Atorvastatin     Other reaction(s): MUSCLE PAIN (all statins)  . Cephalexin Rash    Patient Measurements: Height: 5\' 5"  (165.1 cm) Weight: 190 lb 7.6 oz (86.4 kg) IBW/kg (Calculated) : 57  Vital Signs: Temp: 98.3 F (36.8 C) (06/14 2215) Temp Source: Tympanic (06/14 2215) BP: 133/62 mmHg (06/14 2215) Pulse Rate: 61 (06/14 2215)  Labs:  Recent Labs  02/03/16 1129 02/03/16 2007  HGB 11.2* 9.0*  HCT 33.0* 28.2*  PLT  --  231  CREATININE 0.60 0.55    Estimated Creatinine Clearance: 62.9 mL/min (by C-G formula based on Cr of 0.55).   Medical History: Past Medical History  Diagnosis Date  . Myocardial infarction (HCC) 1982  . Hypothyroidism   . Polymyalgia rheumatica (HCC)   . Carotid artery occlusion   . AAA (abdominal aortic aneurysm) (HCC)   . Anemia   . Arthritis     Neck   Assessment: 81 y.o. F s/p angiogram this evening. To begin Lovenox for VTE prophylaxis. Noted that pt is just observation overnight.   Goal of Therapy:  VTE prevention Monitor platelets by anticoagulation protocol: Yes   Plan:  Lovenox 40mg  SQ daily Pharmacy will sign off - please reconsult if needed  Christoper Fabianaron Rafferty Postlewait, PharmD, BCPS Clinical pharmacist, pager  [phone] 02/03/2016,10:48 PM

## 2016-02-04 ENCOUNTER — Encounter (HOSPITAL_COMMUNITY): Payer: Self-pay | Admitting: Vascular Surgery

## 2016-02-04 LAB — COMPREHENSIVE METABOLIC PANEL
ALK PHOS: 33 U/L — AB (ref 38–126)
ALT: 8 U/L — ABNORMAL LOW (ref 14–54)
ANION GAP: 5 (ref 5–15)
AST: 20 U/L (ref 15–41)
Albumin: 3.4 g/dL — ABNORMAL LOW (ref 3.5–5.0)
BILIRUBIN TOTAL: 0.8 mg/dL (ref 0.3–1.2)
BUN: 6 mg/dL (ref 6–20)
CALCIUM: 8.7 mg/dL — AB (ref 8.9–10.3)
CO2: 24 mmol/L (ref 22–32)
Chloride: 111 mmol/L (ref 101–111)
Creatinine, Ser: 0.6 mg/dL (ref 0.44–1.00)
GFR calc non Af Amer: >60 mL/min (ref >60)
Glucose, Bld: 129 mg/dL — ABNORMAL HIGH (ref 65–99)
Potassium: 3.9 mmol/L (ref 3.5–5.1)
SODIUM: 140 mmol/L (ref 135–145)
TOTAL PROTEIN: 6.1 g/dL — AB (ref 6.5–8.1)

## 2016-02-04 LAB — CBC
HEMATOCRIT: 28.3 % — AB (ref 36.0–46.0)
HEMOGLOBIN: 9 g/dL — AB (ref 12.0–15.0)
MCH: 33 pg (ref 26.0–34.0)
MCHC: 31.8 g/dL (ref 30.0–36.0)
MCV: 103.7 fL — ABNORMAL HIGH (ref 78.0–100.0)
Platelets: 243 10*3/uL (ref 150–400)
RBC: 2.73 MIL/uL — ABNORMAL LOW (ref 3.87–5.11)
RDW: 15.9 % — AB (ref 11.5–15.5)
WBC: 7.4 10*3/uL (ref 4.0–10.5)

## 2016-02-04 LAB — PROTIME-INR
INR: 1.2 (ref 0.00–1.49)
PROTHROMBIN TIME: 15.4 s — AB (ref 11.6–15.2)

## 2016-02-04 NOTE — Progress Notes (Signed)
Utilization review completed.  

## 2016-02-04 NOTE — Progress Notes (Signed)
Patient ID: Margaret Castillo, female   DOB: 01/11/1935, 80 y.o.   MRN: [id number] Comfortable this morning. No further nausea or weakness. Right groin puncture site without hematoma Remains hemodynamically stable. Will mobilize this morning and plan discharge after breakfast.  Discussed in general findings with the patient and her son present. Appears as though she has a type I endoleak on the posterior wall of the aorta to stent graft junction. We will plan aortic cuff placement in the operating room in the next 1-2 weeks at their convenience. We'll schedule this for surgery. 

## 2016-02-04 NOTE — Progress Notes (Signed)
Pt. Discharged to home with son Pt. D/C'd via  Wheelchair with volunteers Discharge information reviewed and given All personal belongings given to Pt.  Education discussed IV was d/c Tele d/c

## 2016-02-04 NOTE — Discharge Summary (Signed)
Discharge Summary    [name] 1935/08/04 81 y.o. female  [id number]  Admission Date: 02/03/2016  Discharge Date: 02/04/16  Physician: Pryor OchoaJames D Lawson, MD  Admission Diagnosis: Triple A Endoleak History of EVAR 2008   HPI:   This is a 81 y.o. female with known endoleak with aneurysm expansion seen by my partner Dr Arbie CookeyEarly last week. She was scheduled for angiogram in 10 days but came to ER today with worsening flank pain. She also has a known left renal stone. No syncope or collapse pain is tolerable but worse than 3 days ago.  Hospital Course:  The patient was admitted to the hospital and taken to the operating room on 02/03/2016 and underwent: biplane abdominal aortogram via right common femoral approach    The following were findings and conclusions: Findings the aortobiiliac stent graft was widely patent into both common iliac arteries. There was good prompt filling of both common external and internal iliac arteries. Renal arteries filled promptly and were widely patent. After examining all other views it appeared that there was a small-1A endoleak with some contrast being visualized post-anteriorly to the patient's right side about 4-5 cm distal to the proximal portion of the stent graft. No distal leaks were noted. Otherwise there were no unusual findings  Conclusion #1 probable small type IA endoleak extending posteriorly into the patient's right side about 5 cm from proximal portion of aortobiiliac stent graft #2 widely patent aorto by common iliac stent graft  The pt tolerated the procedure well and was transported to the PACU in good condition.   After the procedure, she did have some nausea and weakness and was admitted overnight for observation.  By POD 1, she was doing well without further nausea or weakness.    Dr. Arbie CookeyEarly did discuss the findings with the pt and her son.  Appears as though she has a type I endoleak on the posterior wall of the aorta to stent  graft junction. We will plan aortic cuff placement in the operating room in the next 1-2 weeks at their convenience. We'll schedule this for surgery.  The remainder of the hospital course consisted of increasing mobilization and increasing intake of solids without difficulty.  CBC    Component Value Date/Time   WBC 7.4 02/04/2016 2300   RBC 2.73* 02/04/2016 2300   HGB 9.0* 02/04/2016 2300   HCT 28.3* 02/04/2016 2300   PLT 243 02/04/2016 2300   MCV 103.7* 02/04/2016 2300   MCH 33.0 02/04/2016 2300   MCHC 31.8 02/04/2016 2300   RDW 15.9* 02/04/2016 2300   LYMPHSABS 2.4 01/23/2016 1615   MONOABS 0.6 01/23/2016 1615   EOSABS 0.2 01/23/2016 1615   BASOSABS 0.1 01/23/2016 1615    BMET    Component Value Date/Time   NA 140 02/04/2016 2300   K 3.9 02/04/2016 2300   CL 111 02/04/2016 2300   CO2 24 02/04/2016 2300   GLUCOSE 129* 02/04/2016 2300   BUN 6 02/04/2016 2300   CREATININE 0.60 02/04/2016 2300   CALCIUM 8.7* 02/04/2016 2300   GFRNONAA >60 02/04/2016 2300   GFRAA >60 02/04/2016 2300      Discharge Instructions    Call MD for:  redness, tenderness, or signs of infection (pain, swelling, bleeding, redness, odor or green/yellow discharge around incision site)    Complete by:  As directed      Call MD for:  severe or increased pain, loss or decreased feeling  in affected limb(s)    Complete by:  As directed      Call MD for:  temperature >100.5    Complete by:  As directed      Discharge wound care:    Complete by:  As directed   Shower daily with soap and water starting 02/04/16     Driving Restrictions    Complete by:  As directed   No driving for 48 hours and while taking pain medication.     Lifting restrictions    Complete by:  As directed   No heavy lifting for 3 weeks     Resume previous diet    Complete by:  As directed            Discharge Diagnosis:  Triple A Endoleak History of EVAR 2008  Secondary Diagnosis: Patient Active Problem List    Diagnosis Date Noted  . Syncope, vasovagal 02/03/2016  . Pain in limb-Left knee 08/13/2013  . Aftercare following surgery of the circulatory system, NEC 08/13/2013  . Occlusion and stenosis of carotid artery without mention of cerebral infarction 08/09/2012  . Dizziness 08/09/2012   Past Medical History  Diagnosis Date  . Myocardial infarction (HCC) 1982  . Hypothyroidism   . Polymyalgia rheumatica (HCC)   . Carotid artery occlusion   . AAA (abdominal aortic aneurysm) (HCC)   . Anemia   . Arthritis     Neck       Medication List    TAKE these medications        acetaminophen 500 MG tablet  Commonly known as:  TYLENOL  Take 500 mg by mouth as needed for mild pain.     aspirin EC 81 MG tablet  Take 81 mg by mouth daily.     ciprofloxacin 500 MG tablet  Commonly known as:  CIPRO  Take 1 tablet (500 mg total) by mouth 2 (two) times daily.     cyanocobalamin 1000 MCG/ML injection  Commonly known as:  (VITAMIN B-12)  Inject 1,000 mcg into the muscle every 30 (thirty) days.     ferrous sulfate 325 (65 FE) MG tablet  Take 325 mg by mouth daily with breakfast.     ibuprofen 600 MG tablet  Commonly known as:  ADVIL,MOTRIN  Take 600 mg by mouth every 8 (eight) hours as needed.     meclizine 25 MG tablet  Commonly known as:  ANTIVERT  Take 12.5 mg by mouth 2 (two) times daily as needed for dizziness.     predniSONE 5 MG tablet  Commonly known as:  DELTASONE  Take 5 mg by mouth daily.     SYNTHROID 112 MCG tablet  Generic drug:  levothyroxine  Take 112 mcg by mouth daily. Patient alternates with every other day     SYNTHROID 100 MCG tablet  Generic drug:  levothyroxine  100 mcg every other day. Alternating with 112 mcg     tamsulosin 0.4 MG Caps capsule  Commonly known as:  FLOMAX  Take 0.4 mg by mouth daily after supper.     triamcinolone cream 0.1 %  Commonly known as:  KENALOG  Apply 1 application topically as needed.     Vitamin D (Ergocalciferol)  50000 units Caps capsule  Commonly known as:  DRISDOL  Take 50,000 Units by mouth every 7 (seven) days.        Prescriptions given: none  Instructions: 1.  No driving for 48 hours and while taking pain medication.  Disposition: home  Patient's condition: is Good  Follow up: 1.  Surgery will be scheduled in next 1-2 weeks at her convenience.    Doreatha Massed, PA-C [facility] [phone] 02/04/2016  8:52 AM

## 2016-02-09 ENCOUNTER — Telehealth: Payer: Self-pay

## 2016-02-09 NOTE — Telephone Encounter (Signed)
Phone call from pt. Reported she woke up and had no energy, and felt overall weak.  Stated she increased her fluids and ate something, and started feeling better since 12:00 PM.  Reported she had an episode earlier this morning with the top of her head feeling tight, but has not experienced that any further.  Referred to the episode as feeling "shocky."  Denied any cold sweats.  Denied fever/ chills, nausea/vomiting, dizziness.  Stated she has a low BP in her arms, and advised to check BP in her legs.  Reported her sister is a Engineer, civil (consulting)nurse and obtained BP of 122/78 in leg today.  Advised to continue to stay hydrated, to move about gradually, and to go to ER if symptoms worsen.  Informed that nurse will make Dr. Hart RochesterLawson aware of symptoms in AM.  Pt. verb. understanding.

## 2016-02-10 NOTE — Telephone Encounter (Signed)
Phone call to pt this AM to check on her status.  Reported she is feeling better today.  Stated she is only a little weak.  Denied any further episodes of light headedness, or "tightness" in her head.  Reported pt's. Symptoms to Dr. Arbie CookeyEarly.  Advised that the symptoms she has had, are not likely related to the Endoleak;  that this is likely more of a medical issue, and pt. should f/u with her PCP.  Notified pt. Of Dr. Bosie HelperEarly's recommendation to report symptoms to her PCP.  Verb. Understanding, and agreed.

## 2016-02-18 ENCOUNTER — Other Ambulatory Visit: Payer: Self-pay

## 2016-02-20 DIAGNOSIS — IMO0002 Reserved for concepts with insufficient information to code with codable children: Secondary | ICD-10-CM

## 2016-02-20 HISTORY — DX: Reserved for concepts with insufficient information to code with codable children: IMO0002

## 2016-02-25 ENCOUNTER — Encounter (HOSPITAL_COMMUNITY): Payer: Self-pay

## 2016-02-25 ENCOUNTER — Encounter (HOSPITAL_COMMUNITY)
Admission: RE | Admit: 2016-02-25 | Discharge: 2016-02-25 | Disposition: A | Discharge status: Home / Self Care | Payer: Medicare Other | Source: Ambulatory Visit | Attending: Vascular Surgery | Admitting: Vascular Surgery

## 2016-02-25 DIAGNOSIS — Z01812 Encounter for preprocedural laboratory examination: Secondary | ICD-10-CM | POA: Insufficient documentation

## 2016-02-25 DIAGNOSIS — Z01818 Encounter for other preprocedural examination: Secondary | ICD-10-CM | POA: Insufficient documentation

## 2016-02-25 DIAGNOSIS — T82330A Leakage of aortic (bifurcation) graft (replacement), initial encounter: Secondary | ICD-10-CM | POA: Diagnosis not present

## 2016-02-25 DIAGNOSIS — Y832 Surgical operation with anastomosis, bypass or graft as the cause of abnormal reaction of the patient, or of later complication, without mention of misadventure at the time of the procedure: Secondary | ICD-10-CM | POA: Insufficient documentation

## 2016-02-25 DIAGNOSIS — M353 Polymyalgia rheumatica: Secondary | ICD-10-CM | POA: Diagnosis not present

## 2016-02-25 DIAGNOSIS — I252 Old myocardial infarction: Secondary | ICD-10-CM | POA: Insufficient documentation

## 2016-02-25 DIAGNOSIS — E039 Hypothyroidism, unspecified: Secondary | ICD-10-CM | POA: Diagnosis not present

## 2016-02-25 DIAGNOSIS — Z7982 Long term (current) use of aspirin: Secondary | ICD-10-CM | POA: Diagnosis not present

## 2016-02-25 DIAGNOSIS — Z79899 Other long term (current) drug therapy: Secondary | ICD-10-CM | POA: Diagnosis not present

## 2016-02-25 DIAGNOSIS — H9190 Unspecified hearing loss, unspecified ear: Secondary | ICD-10-CM | POA: Diagnosis not present

## 2016-02-25 DIAGNOSIS — M1990 Unspecified osteoarthritis, unspecified site: Secondary | ICD-10-CM | POA: Insufficient documentation

## 2016-02-25 DIAGNOSIS — Z0183 Encounter for blood typing: Secondary | ICD-10-CM | POA: Insufficient documentation

## 2016-02-25 DIAGNOSIS — Z87891 Personal history of nicotine dependence: Secondary | ICD-10-CM | POA: Insufficient documentation

## 2016-02-25 DIAGNOSIS — Z7952 Long term (current) use of systemic steroids: Secondary | ICD-10-CM | POA: Diagnosis not present

## 2016-02-25 HISTORY — DX: Presence of external hearing-aid: Z97.4

## 2016-02-25 LAB — URINALYSIS, ROUTINE W REFLEX MICROSCOPIC
BILIRUBIN URINE: NEGATIVE
GLUCOSE, UA: NEGATIVE mg/dL
HGB URINE DIPSTICK: NEGATIVE
KETONES UR: 15 mg/dL — AB
Nitrite: NEGATIVE
PH: 5.5 (ref 5.0–8.0)
PROTEIN: NEGATIVE mg/dL
Specific Gravity, Urine: 1.033 — ABNORMAL HIGH (ref 1.005–1.030)

## 2016-02-25 LAB — CBC
HCT: 29.8 % — ABNORMAL LOW (ref 36.0–46.0)
Hemoglobin: 9.8 g/dL — ABNORMAL LOW (ref 12.0–15.0)
MCH: 34.8 pg — ABNORMAL HIGH (ref 26.0–34.0)
MCHC: 32.9 g/dL (ref 30.0–36.0)
MCV: 105.7 fL — AB (ref 78.0–100.0)
PLATELETS: 240 10*3/uL (ref 150–400)
RBC: 2.82 MIL/uL — AB (ref 3.87–5.11)
RDW: 15.8 % — AB (ref 11.5–15.5)
WBC: 7 10*3/uL (ref 4.0–10.5)

## 2016-02-25 LAB — PROTIME-INR
INR: 1.09 (ref 0.00–1.49)
PROTHROMBIN TIME: 14.3 s (ref 11.6–15.2)

## 2016-02-25 LAB — URINE MICROSCOPIC-ADD ON

## 2016-02-25 LAB — COMPREHENSIVE METABOLIC PANEL
ALBUMIN: 3.6 g/dL (ref 3.5–5.0)
ALT: 14 U/L (ref 14–54)
AST: 30 U/L (ref 15–41)
Alkaline Phosphatase: 46 U/L (ref 38–126)
Anion gap: 9 (ref 5–15)
BUN: 10 mg/dL (ref 6–20)
CHLORIDE: 112 mmol/L — AB (ref 101–111)
CO2: 18 mmol/L — AB (ref 22–32)
CREATININE: 0.61 mg/dL (ref 0.44–1.00)
Calcium: 9.2 mg/dL (ref 8.9–10.3)
GFR calc Af Amer: >60 mL/min (ref >60)
GFR calc non Af Amer: >60 mL/min (ref >60)
GLUCOSE: 132 mg/dL — AB (ref 65–99)
POTASSIUM: 4.1 mmol/L (ref 3.5–5.1)
SODIUM: 139 mmol/L (ref 135–145)
Total Bilirubin: 0.5 mg/dL (ref 0.3–1.2)
Total Protein: 6.8 g/dL (ref 6.5–8.1)

## 2016-02-25 LAB — SURGICAL PCR SCREEN
MRSA, PCR: NEGATIVE
Staphylococcus aureus: NEGATIVE

## 2016-02-25 LAB — BLOOD GAS, ARTERIAL
ACID-BASE DEFICIT: 3.8 mmol/L — AB (ref 0.0–2.0)
Bicarbonate: 19.9 mEq/L — ABNORMAL LOW (ref 20.0–24.0)
DRAWN BY: 42180
O2 SAT: 98.4 %
PATIENT TEMPERATURE: 98.6
PCO2 ART: 31 mmHg — AB (ref 35.0–45.0)
TCO2: 20.8 mmol/L (ref 0–100)
pH, Arterial: 7.423 (ref 7.350–7.450)
pO2, Arterial: 108 mmHg — ABNORMAL HIGH (ref 80.0–100.0)

## 2016-02-25 LAB — APTT: aPTT: 29 seconds (ref 24–37)

## 2016-02-25 NOTE — Pre-Procedure Instructions (Signed)
    Weldon PickingLouise T Deacon  02/25/2016      [facility] [phone] [place]- [place], [place] - [address] [facility] [address] [place] [id number] Phone: [phone] Fax: [phone]    Your procedure is scheduled on February 29, 2016.  [facility] Admitting at 5:30 A.M.  Call this number if you have problems the morning of surgery:  [phone]   Remember:  Do not eat food or drink liquids after midnight.  Take these medicines the morning of surgery with A SIP OF WATER : levothyroxine (SYNTHROID)   STOP NSAID'S(ADVIL, ALEVE, IBUPROFEN), HERBAL MEDICATIONS ONE WEEK PRIOR TO SURGERY   Do not wear jewelry, make-up or nail polish.  Do not wear lotions, powders, or perfumes.  You may NOT wear deoderant.  Do not shave 48 hours prior to surgery.    Do not bring valuables to the hospital.  [facility] for any belongings or valuables.  Contacts, dentures or bridgework may not be worn into surgery.  Leave your suitcase in the car.  After surgery it may be brought to your room.  For patients admitted to the hospital, discharge time will be determined by your treatment team.  Patients discharged the day of surgery will not be allowed to drive home.   Name and phone number of your driver:    Special instructions:  "Preparing for surgery"  Please read over the following fact sheets that you were given. Pain Booklet, Coughing and Deep Breathing and Surgical Site Infection Prevention

## 2016-02-26 MED ORDER — VANCOMYCIN HCL IN DEXTROSE 1-5 GM/200ML-% IV SOLN
1000.0000 mg | INTRAVENOUS | Status: AC
  Administered 2016-02-29: 1000 mg via INTRAVENOUS
  Filled 2016-02-26: qty 200

## 2016-02-26 NOTE — Progress Notes (Addendum)
Anesthesia Chart Review: Patient is a 81 year old female scheduled for aortic cuff/Gore for endoleak on 02/29/16 by Dr. Arbie CookeyEarly.   History includes AAA s/p Gore Excluder stent graft repair 10/11/06 with finding of type 1A endoleak 01/10/16 (during CT evaluation for abdominal pain, left ureteral stone), former smoker, MI '82, hypothyroidism, polymyalgia rheumatica, carotid artery disease s/p left CEA 12/07/06 with left innominate/SCA stenosis by CTA 12/02/15, anemia, arthritis, hearing loss (hearing aid), cholecystectomy, tonsillectomy, appendectomy.   PCP is Dr. Julieta GuttingByron Hoffman/Candice Clark, FNP with [facility] ([facility]; see Care Everywhere). He last saw her on 02/11/16. He notes history of anemia and large right groin hematoma following recent venipuncture for her arteriogram. He was aware of surgery plans. Patient denied CP/SOB. I don't see any cardiology notes in Epic or Care Everywhere.  Meds include ASA 81mg , 65 Fe, levothyroxine, meclizine, prednisone.  PAT Vitals: BP 164/66, HR 78, RR 20, T 36.2C, O2 sat 99%.  02/03/16 EKG: NSR, low voltage QRS, possible anterolateral infarct (age undetermined), T wave abnormality, consider anterior ischemia. Anterior T wave abnormality has been present on tracings dating back to at least 07/27/94. Poor r wave progression and extension of negative T waves to V4-6 is new when compared to 01/02/08 tracing. She has intermittently had a negative T wave in inferior lead III. (Currently, I cannot view the 02/24/15 tracing from her PCP office, but by results narrative it showed NSR, non-specific T wave abnormality, and T wave inversion now evident in inferior leads.)  03/16/15 Echo [facility]; Care Everywhere; ordered for evaluation of fatigue by PCP): Findings: Normal left ventricular ejection fraction (60-65%) Diastolic left ventricular dysfunction Elevated left ventricular filling pressures Degenerative mitral valve disease Mitral  regurgitation (mild) Dilated left atrium Aortic sclerosis Normal right ventricular contractile performance Pericardial effusion (relatively echo-free space c/w trivial pericardial effusion and/or subepicardial fat)  11/04/09 Nuclear stress test [facility]; Care Everywhere): Impression: - No evidence of significant ischemia or scar on imaging study - Normal LV function EF> 65% - Breast attenuation is noted - Sensitivity and specificity of this test are reduced by the noted attenuation  02/03/16 Abdominal aortogram with LE angiography: Conclusion #1 probable small type IA endoleak extending posteriorly into the patient's right side about 5 cm from proximal portion of aortobiiliac stent graft #2 widely patent aorto by common iliac stent graft.  12/02/15 CTA neck: IMPRESSION: - Atherosclerotic disease of the aortic arch, affecting the brachiocephalic vessel origins. The stenoses are potentially flow limiting, particularly at the innominate artery and the left subclavian artery origins. - Left carotid bifurcation widely patent status post endarterectomy. - Atherosclerotic disease at the right carotid bifurcation but without flow limiting stenosis. Minimal diameter of the right ICA shows a stenosis of only 15%. - 50% or greater stenosis at the right vertebral artery origin. 30-50% stenoses of both vertebral arteries at foramen magnum level. (Reviewd by Dr. Arbie CookeyEarly on 12/08/15. He wrote, "Very difficult management case. Certainly would not recommend surgical repair at this time. Explained that this would require median sternotomy and replacement of her innominate artery. Also explained there is no way of knowing that this brief visual change was related to embolic event. She has not been on aspirin or other antiplatelet agents. We will start her on aspirin 81 mg enteric-coated per day. She'll notify us immediately should she have any further episodes of amaurosis or focal neurologic deficits. If she does  we would recommend arch arteriography for further evaluation and possible revascularization. Will see her  again in 6 months with repeat noninvasive studies.")  08/27/14 Carotid U/S: Right ICA velocities suggest < 40% stenosis. Patent left CEA site with no evidence of hyperplasia or restenosis. Abnormal flow patterns noted in the left vertebral (bidirectional) and subclavian (monophasic) arteries. No significant change when compared to 08/13/13 exam.  Preoperative labs noted. Cr 0.61. H/H 9.8/29.8, up from 9.0/28.3 on 02/04/16 (H/H 10.3/31.9 pre-arteriogram). PT/PTT WNL. T&S done. Since HGB < 10 will add prepare for 2 Units PRBC but defer decision to transfuse to surgeon and/or anesthesiologist. (CBC results routed to Dr. Arbie CookeyEarly and [facility] RNs Judeth CornfieldStephanie and Joyce GrossKay.)  Reviewed above with anesthesiologist Dr. Leta JunglingEwell. EKGs reviewed with multiple anesthesiologists. Latest EKG in 01/2016 has loss of voltage in lateral leads which is new. Consider lead placement. Plan to repeat EKG on the day of surgery.  Review of EKG and further evaluation of patient on the day of surgery by her anesthesiologist. Definitive anesthesia plan at that time.  Velna Ochsllison Timberlynn Kizziah, PA-C [facility] Phone [phone] 02/26/2016 1:48 PM

## 2016-02-29 ENCOUNTER — Inpatient Hospital Stay (HOSPITAL_COMMUNITY)
Admission: RE | Admit: 2016-02-29 | Discharge: 2016-03-01 | DRG: 269 | Disposition: A | Discharge status: Home / Self Care | Payer: Medicare Other | Source: Ambulatory Visit | Attending: Vascular Surgery | Admitting: Vascular Surgery

## 2016-02-29 ENCOUNTER — Inpatient Hospital Stay (HOSPITAL_COMMUNITY): Payer: Medicare Other

## 2016-02-29 ENCOUNTER — Encounter (HOSPITAL_COMMUNITY)
Admission: RE | Disposition: A | Discharge status: Home / Self Care | Payer: Self-pay | Source: Ambulatory Visit | Attending: Vascular Surgery

## 2016-02-29 ENCOUNTER — Encounter (HOSPITAL_COMMUNITY): Payer: Self-pay | Admitting: *Deleted

## 2016-02-29 ENCOUNTER — Inpatient Hospital Stay (HOSPITAL_COMMUNITY): Payer: Medicare Other | Admitting: Certified Registered Nurse Anesthetist

## 2016-02-29 ENCOUNTER — Inpatient Hospital Stay (HOSPITAL_COMMUNITY): Payer: Medicare Other | Admitting: Vascular Surgery

## 2016-02-29 DIAGNOSIS — Z885 Allergy status to narcotic agent status: Secondary | ICD-10-CM

## 2016-02-29 DIAGNOSIS — Z880 Allergy status to penicillin: Secondary | ICD-10-CM | POA: Diagnosis not present

## 2016-02-29 DIAGNOSIS — Z8042 Family history of malignant neoplasm of prostate: Secondary | ICD-10-CM

## 2016-02-29 DIAGNOSIS — Z881 Allergy status to other antibiotic agents status: Secondary | ICD-10-CM | POA: Diagnosis not present

## 2016-02-29 DIAGNOSIS — I252 Old myocardial infarction: Secondary | ICD-10-CM | POA: Diagnosis not present

## 2016-02-29 DIAGNOSIS — E039 Hypothyroidism, unspecified: Secondary | ICD-10-CM | POA: Diagnosis present

## 2016-02-29 DIAGNOSIS — Y718 Miscellaneous cardiovascular devices associated with adverse incidents, not elsewhere classified: Secondary | ICD-10-CM | POA: Diagnosis present

## 2016-02-29 DIAGNOSIS — Z9889 Other specified postprocedural states: Secondary | ICD-10-CM

## 2016-02-29 DIAGNOSIS — T82539A Leakage of unspecified cardiac and vascular devices and implants, initial encounter: Secondary | ICD-10-CM | POA: Diagnosis present

## 2016-02-29 DIAGNOSIS — T82538A Leakage of other cardiac and vascular devices and implants, initial encounter: Principal | ICD-10-CM | POA: Diagnosis present

## 2016-02-29 DIAGNOSIS — Z7952 Long term (current) use of systemic steroids: Secondary | ICD-10-CM | POA: Diagnosis not present

## 2016-02-29 DIAGNOSIS — Z8249 Family history of ischemic heart disease and other diseases of the circulatory system: Secondary | ICD-10-CM

## 2016-02-29 DIAGNOSIS — Z888 Allergy status to other drugs, medicaments and biological substances status: Secondary | ICD-10-CM | POA: Diagnosis not present

## 2016-02-29 DIAGNOSIS — M353 Polymyalgia rheumatica: Secondary | ICD-10-CM | POA: Diagnosis present

## 2016-02-29 DIAGNOSIS — I714 Abdominal aortic aneurysm, without rupture, unspecified: Secondary | ICD-10-CM | POA: Diagnosis present

## 2016-02-29 DIAGNOSIS — Z87891 Personal history of nicotine dependence: Secondary | ICD-10-CM

## 2016-02-29 HISTORY — PX: ABDOMINAL AORTIC ENDOVASCULAR STENT GRAFT: SHX5707

## 2016-02-29 LAB — BASIC METABOLIC PANEL
ANION GAP: 5 (ref 5–15)
BUN: 5 mg/dL — ABNORMAL LOW (ref 6–20)
CALCIUM: 8.4 mg/dL — AB (ref 8.9–10.3)
CO2: 23 mmol/L (ref 22–32)
CREATININE: 0.53 mg/dL (ref 0.44–1.00)
Chloride: 114 mmol/L — ABNORMAL HIGH (ref 101–111)
GLUCOSE: 90 mg/dL (ref 65–99)
Potassium: 3.7 mmol/L (ref 3.5–5.1)
Sodium: 142 mmol/L (ref 135–145)

## 2016-02-29 LAB — CBC
HEMATOCRIT: 26.9 % — AB (ref 36.0–46.0)
Hemoglobin: 8.7 g/dL — ABNORMAL LOW (ref 12.0–15.0)
MCH: 33.9 pg (ref 26.0–34.0)
MCHC: 32.3 g/dL (ref 30.0–36.0)
MCV: 104.7 fL — AB (ref 78.0–100.0)
PLATELETS: 197 10*3/uL (ref 150–400)
RBC: 2.57 MIL/uL — ABNORMAL LOW (ref 3.87–5.11)
RDW: 15.9 % — AB (ref 11.5–15.5)
WBC: 10.4 10*3/uL (ref 4.0–10.5)

## 2016-02-29 LAB — APTT: APTT: 30 s (ref 24–37)

## 2016-02-29 LAB — PREPARE RBC (CROSSMATCH)

## 2016-02-29 LAB — PROTIME-INR
INR: 1.26 (ref 0.00–1.49)
Prothrombin Time: 15.9 seconds — ABNORMAL HIGH (ref 11.6–15.2)

## 2016-02-29 LAB — MAGNESIUM: Magnesium: 1.8 mg/dL (ref 1.7–2.4)

## 2016-02-29 SURGERY — INSERTION, ENDOVASCULAR STENT GRAFT, AORTA, ABDOMINAL
Anesthesia: General | Site: Abdomen

## 2016-02-29 MED ORDER — SUGAMMADEX SODIUM 200 MG/2ML IV SOLN
INTRAVENOUS | Status: AC
  Filled 2016-02-29: qty 2

## 2016-02-29 MED ORDER — FENTANYL CITRATE (PF) 250 MCG/5ML IJ SOLN
INTRAMUSCULAR | Status: DC | PRN
  Administered 2016-02-29 (×3): 50 ug via INTRAVENOUS

## 2016-02-29 MED ORDER — DOCUSATE SODIUM 100 MG PO CAPS
100.0000 mg | ORAL_CAPSULE | Freq: Every day | ORAL | Status: DC
  Filled 2016-02-29: qty 1

## 2016-02-29 MED ORDER — HYDRALAZINE HCL 20 MG/ML IJ SOLN: 5.0000 mg | INTRAMUSCULAR | Status: DC | PRN

## 2016-02-29 MED ORDER — FENTANYL CITRATE (PF) 250 MCG/5ML IJ SOLN
INTRAMUSCULAR | Status: AC
  Filled 2016-02-29: qty 5

## 2016-02-29 MED ORDER — OXYCODONE HCL 5 MG PO TABS: 5.0000 mg | ORAL_TABLET | Freq: Once | ORAL | Status: DC | PRN

## 2016-02-29 MED ORDER — ROCURONIUM BROMIDE 50 MG/5ML IV SOLN
INTRAVENOUS | Status: AC
  Filled 2016-02-29: qty 1

## 2016-02-29 MED ORDER — SODIUM CHLORIDE 0.9 % IV SOLN
INTRAVENOUS | Status: DC
  Administered 2016-02-29: 22:00:00 via INTRAVENOUS
  Administered 2016-02-29: 125 mL/h via INTRAVENOUS

## 2016-02-29 MED ORDER — ONDANSETRON HCL 4 MG/2ML IJ SOLN
INTRAMUSCULAR | Status: DC | PRN
  Administered 2016-02-29: 4 mg via INTRAVENOUS

## 2016-02-29 MED ORDER — SODIUM CHLORIDE 0.9 % IV SOLN: INTRAVENOUS | Status: DC

## 2016-02-29 MED ORDER — PROTAMINE SULFATE 10 MG/ML IV SOLN
INTRAVENOUS | Status: AC
  Filled 2016-02-29: qty 5

## 2016-02-29 MED ORDER — HEPARIN SODIUM (PORCINE) 1000 UNIT/ML IJ SOLN
INTRAMUSCULAR | Status: DC | PRN
  Administered 2016-02-29: 6000 [IU] via INTRAVENOUS

## 2016-02-29 MED ORDER — ASPIRIN EC 81 MG PO TBEC
81.0000 mg | DELAYED_RELEASE_TABLET | Freq: Every day | ORAL | Status: DC
  Administered 2016-03-01: 81 mg via ORAL
  Filled 2016-02-29: qty 1

## 2016-02-29 MED ORDER — ROCURONIUM BROMIDE 100 MG/10ML IV SOLN
INTRAVENOUS | Status: DC | PRN
  Administered 2016-02-29: 40 mg via INTRAVENOUS

## 2016-02-29 MED ORDER — PROPOFOL 10 MG/ML IV BOLUS
INTRAVENOUS | Status: DC | PRN
  Administered 2016-02-29: 50 mg via INTRAVENOUS
  Administered 2016-02-29: 100 mg via INTRAVENOUS

## 2016-02-29 MED ORDER — FENTANYL CITRATE (PF) 100 MCG/2ML IJ SOLN: 25.0000 ug | INTRAMUSCULAR | Status: DC | PRN

## 2016-02-29 MED ORDER — ACETAMINOPHEN 325 MG PO TABS: 325.0000 mg | ORAL_TABLET | ORAL | Status: DC | PRN

## 2016-02-29 MED ORDER — ENOXAPARIN SODIUM 40 MG/0.4ML ~~LOC~~ SOLN: 40.0000 mg | SUBCUTANEOUS | Status: DC

## 2016-02-29 MED ORDER — GUAIFENESIN-DM 100-10 MG/5ML PO SYRP: 15.0000 mL | ORAL_SOLUTION | ORAL | Status: DC | PRN

## 2016-02-29 MED ORDER — METOPROLOL TARTRATE 5 MG/5ML IV SOLN: 2.0000 mg | INTRAVENOUS | Status: DC | PRN

## 2016-02-29 MED ORDER — OXYCODONE HCL 5 MG/5ML PO SOLN: 5.0000 mg | Freq: Once | ORAL | Status: DC | PRN

## 2016-02-29 MED ORDER — SODIUM CHLORIDE 0.9 % IV SOLN
INTRAVENOUS | Status: DC | PRN
  Administered 2016-02-29: 500 mL

## 2016-02-29 MED ORDER — 0.9 % SODIUM CHLORIDE (POUR BTL) OPTIME
TOPICAL | Status: DC | PRN
  Administered 2016-02-29: 1000 mL

## 2016-02-29 MED ORDER — LACTATED RINGERS IV SOLN
INTRAVENOUS | Status: DC | PRN
  Administered 2016-02-29 (×2): via INTRAVENOUS

## 2016-02-29 MED ORDER — OXYCODONE-ACETAMINOPHEN 5-325 MG PO TABS: 1.0000 | ORAL_TABLET | ORAL | Status: DC | PRN

## 2016-02-29 MED ORDER — IODIXANOL 320 MG/ML IV SOLN
INTRAVENOUS | Status: DC | PRN
  Administered 2016-02-29: 30.3 mL via INTRAVENOUS

## 2016-02-29 MED ORDER — PROPOFOL 10 MG/ML IV BOLUS
INTRAVENOUS | Status: AC
  Filled 2016-02-29: qty 20

## 2016-02-29 MED ORDER — PHENOL 1.4 % MT LIQD: 1.0000 | OROMUCOSAL | Status: DC | PRN

## 2016-02-29 MED ORDER — MECLIZINE HCL 12.5 MG PO TABS
12.5000 mg | ORAL_TABLET | Freq: Two times a day (BID) | ORAL | Status: DC | PRN
  Filled 2016-02-29: qty 1

## 2016-02-29 MED ORDER — PREDNISONE 10 MG PO TABS
5.0000 mg | ORAL_TABLET | Freq: Every day | ORAL | Status: DC
  Administered 2016-02-29 – 2016-03-01 (×2): 5 mg via ORAL
  Filled 2016-02-29 (×2): qty 1

## 2016-02-29 MED ORDER — LEVOTHYROXINE SODIUM 100 MCG PO TABS: 100.0000 ug | ORAL_TABLET | ORAL | Status: DC

## 2016-02-29 MED ORDER — SODIUM CHLORIDE 0.9 % IV SOLN: 500.0000 mL | Freq: Once | INTRAVENOUS | Status: DC | PRN

## 2016-02-29 MED ORDER — ACETAMINOPHEN 500 MG PO TABS
500.0000 mg | ORAL_TABLET | Freq: Four times a day (QID) | ORAL | Status: DC | PRN
  Administered 2016-02-29 – 2016-03-01 (×2): 1000 mg via ORAL
  Filled 2016-02-29: qty 2
  Filled 2016-02-29 (×2): qty 1

## 2016-02-29 MED ORDER — ACETAMINOPHEN 160 MG/5ML PO SOLN: 325.0000 mg | ORAL | Status: DC | PRN

## 2016-02-29 MED ORDER — LABETALOL HCL 5 MG/ML IV SOLN: 10.0000 mg | INTRAVENOUS | Status: DC | PRN

## 2016-02-29 MED ORDER — LIDOCAINE 2% (20 MG/ML) 5 ML SYRINGE
INTRAMUSCULAR | Status: AC
  Filled 2016-02-29: qty 5

## 2016-02-29 MED ORDER — PHENYLEPHRINE HCL 10 MG/ML IJ SOLN
INTRAMUSCULAR | Status: DC | PRN
  Administered 2016-02-29: 40 ug via INTRAVENOUS
  Administered 2016-02-29 (×3): 80 ug via INTRAVENOUS

## 2016-02-29 MED ORDER — ONDANSETRON HCL 4 MG/2ML IJ SOLN
INTRAMUSCULAR | Status: AC
  Filled 2016-02-29: qty 2

## 2016-02-29 MED ORDER — PHENYLEPHRINE HCL 10 MG/ML IJ SOLN
10.0000 mg | INTRAVENOUS | Status: DC | PRN
  Administered 2016-02-29: 40 ug/min via INTRAVENOUS
  Administered 2016-02-29: 80 ug/min via INTRAVENOUS

## 2016-02-29 MED ORDER — CHLORHEXIDINE GLUCONATE 4 % EX LIQD: 60.0000 mL | Freq: Once | CUTANEOUS | Status: DC

## 2016-02-29 MED ORDER — LEVOTHYROXINE SODIUM 112 MCG PO TABS
112.0000 ug | ORAL_TABLET | ORAL | Status: DC
  Administered 2016-03-01: 112 ug via ORAL
  Filled 2016-02-29: qty 1

## 2016-02-29 MED ORDER — SUGAMMADEX SODIUM 200 MG/2ML IV SOLN
INTRAVENOUS | Status: DC | PRN
  Administered 2016-02-29: 50 mg via INTRAVENOUS

## 2016-02-29 MED ORDER — HYDROMORPHONE HCL 1 MG/ML IJ SOLN: 0.5000 mg | INTRAMUSCULAR | Status: DC | PRN

## 2016-02-29 MED ORDER — PANTOPRAZOLE SODIUM 40 MG PO TBEC
40.0000 mg | DELAYED_RELEASE_TABLET | Freq: Every day | ORAL | Status: DC
  Administered 2016-03-01: 40 mg via ORAL
  Filled 2016-02-29: qty 1

## 2016-02-29 MED ORDER — CIPROFLOXACIN HCL 500 MG PO TABS: 500.0000 mg | ORAL_TABLET | Freq: Two times a day (BID) | ORAL | Status: DC

## 2016-02-29 MED ORDER — POTASSIUM CHLORIDE CRYS ER 20 MEQ PO TBCR: 20.0000 meq | EXTENDED_RELEASE_TABLET | Freq: Every day | ORAL | Status: DC | PRN

## 2016-02-29 MED ORDER — ALUM & MAG HYDROXIDE-SIMETH 200-200-20 MG/5ML PO SUSP: 15.0000 mL | ORAL | Status: DC | PRN

## 2016-02-29 MED ORDER — VANCOMYCIN HCL IN DEXTROSE 1-5 GM/200ML-% IV SOLN
1000.0000 mg | Freq: Two times a day (BID) | INTRAVENOUS | Status: AC
  Administered 2016-02-29 – 2016-03-01 (×2): 1000 mg via INTRAVENOUS
  Filled 2016-02-29 (×2): qty 200

## 2016-02-29 MED ORDER — LIDOCAINE 2% (20 MG/ML) 5 ML SYRINGE
INTRAMUSCULAR | Status: DC | PRN
  Administered 2016-02-29: 50 mg via INTRAVENOUS

## 2016-02-29 MED ORDER — MAGNESIUM SULFATE 2 GM/50ML IV SOLN: 2.0000 g | Freq: Every day | INTRAVENOUS | Status: DC | PRN

## 2016-02-29 MED ORDER — HEPARIN SODIUM (PORCINE) 1000 UNIT/ML IJ SOLN
INTRAMUSCULAR | Status: AC
  Filled 2016-02-29: qty 1

## 2016-02-29 MED ORDER — ONDANSETRON HCL 4 MG/2ML IJ SOLN: 4.0000 mg | Freq: Four times a day (QID) | INTRAMUSCULAR | Status: DC | PRN

## 2016-02-29 MED ORDER — MIDAZOLAM HCL 2 MG/2ML IJ SOLN
INTRAMUSCULAR | Status: AC
  Filled 2016-02-29: qty 2

## 2016-02-29 MED ORDER — PROTAMINE SULFATE 10 MG/ML IV SOLN
INTRAVENOUS | Status: DC | PRN
  Administered 2016-02-29 (×2): 50 mg via INTRAVENOUS

## 2016-02-29 MED ORDER — CYANOCOBALAMIN 1000 MCG/ML IJ SOLN: 1000.0000 ug | INTRAMUSCULAR | Status: DC

## 2016-02-29 MED ORDER — SENNOSIDES-DOCUSATE SODIUM 8.6-50 MG PO TABS: 1.0000 | ORAL_TABLET | Freq: Every evening | ORAL | Status: DC | PRN

## 2016-02-29 SURGICAL SUPPLY — 62 items
APL SKNCLS STERI-STRIP NONHPOA (GAUZE/BANDAGES/DRESSINGS) ×1
BENZOIN TINCTURE PRP APPL 2/3 (GAUZE/BANDAGES/DRESSINGS) ×2 IMPLANT
CANISTER SUCTION 2500CC (MISCELLANEOUS) ×2 IMPLANT
CATH ANGIO 5F BER2 65CM (CATHETERS) ×1 IMPLANT
CATH BEACON 5.038 65CM KMP-01 (CATHETERS) IMPLANT
CATH OMNI FLUSH .035X70CM (CATHETERS) ×1 IMPLANT
CLIP LIGATING EXTRA MED SLVR (CLIP) IMPLANT
CLIP LIGATING EXTRA SM BLUE (MISCELLANEOUS) IMPLANT
CLSR STERI-STRIP ANTIMIC 1/2X4 (GAUZE/BANDAGES/DRESSINGS) ×2 IMPLANT
COVER PROBE W GEL 5X96 (DRAPES) ×1 IMPLANT
DEVICE CLOSURE PERCLS PRGLD 6F (VASCULAR PRODUCTS) ×2 IMPLANT
DRSG TEGADERM 2-3/8X2-3/4 SM (GAUZE/BANDAGES/DRESSINGS) ×4 IMPLANT
DRYSEAL FLEXSHEATH 18FR 33CM (SHEATH) ×1
ELECT CAUTERY BLADE 6.4 (BLADE) ×1 IMPLANT
ELECT REM PT RETURN 9FT ADLT (ELECTROSURGICAL) ×4
ELECTRODE REM PT RTRN 9FT ADLT (ELECTROSURGICAL) ×2 IMPLANT
GAUZE SPONGE 2X2 8PLY STRL LF (GAUZE/BANDAGES/DRESSINGS) ×2 IMPLANT
GLOVE BIOGEL PI IND STRL 6.5 (GLOVE) ×3 IMPLANT
GLOVE BIOGEL PI IND STRL 7.5 (GLOVE) IMPLANT
GLOVE BIOGEL PI INDICATOR 6.5 (GLOVE) ×3
GLOVE BIOGEL PI INDICATOR 7.5 (GLOVE) ×1
GLOVE ECLIPSE 6.5 STRL STRAW (GLOVE) ×1 IMPLANT
GLOVE ECLIPSE 7.0 STRL STRAW (GLOVE) ×1 IMPLANT
GLOVE ECLIPSE 7.5 STRL STRAW (GLOVE) ×1 IMPLANT
GLOVE SS BIOGEL STRL SZ 7.5 (GLOVE) ×1 IMPLANT
GLOVE SUPERSENSE BIOGEL SZ 7.5 (GLOVE) ×1
GOWN STRL REUS W/ TWL LRG LVL3 (GOWN DISPOSABLE) ×3 IMPLANT
GOWN STRL REUS W/TWL LRG LVL3 (GOWN DISPOSABLE) ×6
GOWN STRL REUS W/TWL XL LVL3 (GOWN DISPOSABLE) ×1 IMPLANT
GRAFT BALLN CATH 65CM (STENTS) IMPLANT
GRAFT EXCLUDER AORTIC 28X3.3CM (Endovascular Graft) ×2 IMPLANT
KIT BASIN OR (CUSTOM PROCEDURE TRAY) ×2 IMPLANT
KIT ROOM TURNOVER OR (KITS) ×2 IMPLANT
NDL PERC 18GX7CM (NEEDLE) ×1 IMPLANT
NEEDLE PERC 18GX7CM (NEEDLE) ×2 IMPLANT
NS IRRIG 1000ML POUR BTL (IV SOLUTION) ×2 IMPLANT
PACK ENDOVASCULAR (PACKS) ×2 IMPLANT
PAD ARMBOARD 7.5X6 YLW CONV (MISCELLANEOUS) ×4 IMPLANT
PENCIL BUTTON HOLSTER BLD 10FT (ELECTRODE) ×1 IMPLANT
PERCLOSE PROGLIDE 6F (VASCULAR PRODUCTS) ×4
SHEATH AVANTI 11CM 5FR (MISCELLANEOUS) ×2 IMPLANT
SHEATH AVANTI 11CM 8FR (MISCELLANEOUS) ×2 IMPLANT
SHEATH BRITE TIP 8FR 23CM (MISCELLANEOUS) IMPLANT
SHEATH DRYSEAL FLEX 18FR 33CM (SHEATH) IMPLANT
SHEATH INTROD 8 FR 25CM (MISCELLANEOUS) ×1 IMPLANT
SPONGE GAUZE 2X2 STER 10/PKG (GAUZE/BANDAGES/DRESSINGS) ×2
STAPLER VISISTAT 35W (STAPLE) IMPLANT
STENT GRAFT BALLN CATH 65CM (STENTS) ×1
STOPCOCK MORSE 400PSI 3WAY (MISCELLANEOUS) ×2 IMPLANT
STRIP CLOSURE SKIN 1/2X4 (GAUZE/BANDAGES/DRESSINGS) ×2 IMPLANT
SUT ETHILON 3 0 PS 1 (SUTURE) IMPLANT
SUT PROLENE 5 0 C 1 24 (SUTURE) IMPLANT
SUT VIC AB 2-0 CTX 36 (SUTURE) IMPLANT
SUT VIC AB 3-0 SH 18 (SUTURE) IMPLANT
SUT VIC AB 3-0 SH 27 (SUTURE)
SUT VIC AB 3-0 SH 27X BRD (SUTURE) IMPLANT
SUT VICRYL 4-0 PS2 18IN ABS (SUTURE) ×4 IMPLANT
SYR 30ML LL (SYRINGE) ×2 IMPLANT
TRAY FOLEY W/METER SILVER 16FR (SET/KITS/TRAYS/PACK) ×2 IMPLANT
TUBING HIGH PRESSURE 120CM (CONNECTOR) ×2 IMPLANT
WIRE AMPLATZ SS-J .035X180CM (WIRE) ×1 IMPLANT
WIRE BENTSON .035X145CM (WIRE) ×1 IMPLANT

## 2016-02-29 NOTE — Interval H&P Note (Signed)
GLADY OUDERKIRK  01/20/2016 1:00 PM  Office Visit  MRN:  [id number]   Description: Female DOB: 05/18/1935  Provider: Larina Earthly, MD  Department: [facility]       Diagnoses     AAA (abdominal aortic aneurysm) without rupture (HCC) - Primary    ICD-9-CM: 441.4 ICD-10-CM: I71.4       Reason for Visit     Re-evaluation    CTA abd/pelcis prior - seen in [facility] ED for L side/back pain     Reason for Visit History        Current Vitals  Most recent update: 01/20/2016 12:56 PM by Olene Craven Maness-Harrison, CMA    BP Pulse Ht Wt BMI SpO2    64/51 mmHg 88 5\' 4"  (1.626 m) 188 lb 14.4 oz (85.684 kg) 32.41 kg/m2 100%    Vitals History     BMI Data     Body Mass Index Body Surface Area    32.40 kg/m 2 1.97 m 2      Progress Notes      Raymond Gurney, PA-C at 01/20/2016 1:34 PM     Status: Signed       Expand All Collapse All     [facility] [place]  Patient name: Margaret Naser WilsonMRN: [id number] DOB: 1936/07/03Sex: female  REASON FOR VISIT: Follow-up AAA  HPI: Margaret Castillo is a 81 y.o. female who presents for discussion of her CTA results. The patient underwent endovascular stent graft repair of her AAA in 2008. The patient recently went to [facility] for evaluation of flank pain and abdominal pain. On a noncontrast CT, this revealed an enlargement of her native aneurysm sac. She was sent for a CTA for further evaluation. She has been followed annually with a duplex of her abdomen. Her last EVAR duplex revealed a maximum diameter 4.5 cm.   The patient denies any further abdominal pain.   Past Medical History  Diagnosis Date  . Myocardial infarction (HCC) 1982  . Hypothyroidism   . Polymyalgia rheumatica (HCC)   . Carotid artery occlusion   . AAA (abdominal aortic aneurysm) (HCC)   . Anemia   . Arthritis     Neck    Family History  Problem Relation Age of  Onset  . Heart disease Father     After age 36   . Heart attack Father   . Cancer Sister   . Diabetes Sister   . Cancer Brother   . Cancer Brother   . Heart disease Brother   . Cancer Brother     Prostate  . Diabetes Brother   . Stroke Mother   . Diabetes Mother   . Stroke Sister   . Heart disease Brother   . Cancer Paternal Grandfather   . Arthritis Brother   . Cancer Brother     Prostate  . Heart attack Sister     SOCIAL HISTORY: Social History  Substance Use Topics  . Smoking status: Former Smoker    Quit date: 08/22/1980  . Smokeless tobacco: Never Used  . Alcohol Use: No    Allergies  Allergen Reactions  . Codeine Nausea Only  . Crestor [Rosuvastatin] Other (See Comments)    Leg pain/ joint pain ( ALL THE STATIN's )  . Penicillins Rash  . Atorvastatin     Other reaction(s): MUSCLE PAIN (all statins)  . Cephalexin Rash    Current Outpatient Prescriptions  Medication Sig Dispense Refill  . acetaminophen (TYLENOL) 500 MG  tablet Take by mouth as needed.    . calcium carbonate 200 MG capsule Take 250 mg by mouth daily.    . cyanocobalamin (,VITAMIN B-12,) 1000 MCG/ML injection Inject into the muscle.    . ferrous sulfate 325 (65 FE) MG tablet Take 325 mg by mouth daily with breakfast.    . levothyroxine (SYNTHROID) 112 MCG tablet Take 112 mcg by mouth daily. Patient alternates with 100mcg every other day    . meclizine (ANTIVERT) 25 MG tablet Take 25 mg by mouth as needed.    . predniSONE (DELTASONE) 5 MG tablet Take 5 mg by mouth daily.    . tamsulosin (FLOMAX) 0.4 MG CAPS capsule Take 0.4 mg by mouth.    . triamcinolone cream (KENALOG) 0.1 % as needed.    . Vitamin D, Ergocalciferol, (DRISDOL) 50000 UNITS CAPS Take 50,000 Units by mouth.    Marland Kitchen. alendronate (FOSAMAX) 70 MG tablet Take 70  mg by mouth every 7 (seven) days. Reported on 01/20/2016     No current facility-administered medications for this visit.    REVIEW OF SYSTEMS:  [X]  denotes positive finding, [ ]  denotes negative finding Cardiac  Comments:  Chest pain or chest pressure:    Shortness of breath upon exertion:    Short of breath when lying flat:    Irregular heart rhythm:        Vascular    Pain in calf, thigh, or hip brought on by ambulation:    Pain in feet at night that wakes you up from your sleep:     Blood clot in your veins:    Leg swelling:         Pulmonary    Oxygen at home:    Productive cough:     Wheezing:         Neurologic    Sudden weakness in arms or legs:     Sudden numbness in arms or legs:     Sudden onset of difficulty speaking or slurred speech:    Temporary loss of vision in one eye:     Problems with dizziness:         Gastrointestinal    Blood in stool:     Vomited blood:         Genitourinary    Burning when urinating:     Blood in urine:        Psychiatric    Major depression:         Hematologic    Bleeding problems:    Problems with blood clotting too easily:        Skin    Rashes or ulcers:        Constitutional    Fever or chills:      PHYSICAL EXAM: Filed Vitals:   01/20/16 1245  BP: 64/51  Pulse: 88  Height: 5\' 4"  (1.626 m)  Weight: 188 lb 14.4 oz (85.684 kg)  SpO2: 100%    GENERAL: The patient is a well-nourished female, in no acute distress. The vital signs are documented above. VASCULAR: 2+ radial, femoral and dorsalis pedis pulses bilaterally. PULMONARY: Nonlabored respiratory effort. ABDOMEN: Abdomen is soft and nontender. MUSCULOSKELETAL: There are no major deformities or cyanosis. NEUROLOGIC: No focal weakness or paresthesias are detected. SKIN: There are no ulcers or rashes  noted. PSYCHIATRIC: The patient has a normal affect.  DATA:  CTA abdomen pelvis 01/20/2016  Enlargement of the native aneurysm sac with maximum diameter 5.8 cm. No obvious source of endoleak.  MEDICAL ISSUES: Status post endovascular repair of abdominal aortic aneurysm   There has been evidence of an enlargement of the native aneurysm sac up to 5.8 cm seen on CTA today. It is unclear where the source of her endoleak is. Her last EVAR duplex on 11/06/2015 revealed a maximum diameter of 4.5 cm. Plan for a diagnostic arteriogram to evaluate for source of endoleak. This will be scheduled at the patient's convenience. Management options were discussed including revision of her endovascular graft, open abdominal repair or observation. Treatment to be discussed pending results of arteriogram.  Maris Berger, PA-C [facility]      I have examined the patient, reviewed and agree with above.Had a long discussion with the patient and her son present. Clearly under estimation of aneurysm size with her March 2017 ultrasound. Recent evaluation for renal stone without contrast showed a greater than 5-1/2 cm aneurysm. CT today showed her maximal diameter was 5.8 which is up dramatically from maximal diameter of 4.2cm.  CTA scan today does not show any specific source of endoleak. There may be a blush posterior to the proximal attachment site. No obvious type II endoleak's. I have recommended formal arteriogram for further evaluation to determine if the source for endoleak and sac attention can be found. Will make further recommendations depending on these results. We'll schedule outpatient arteriogram at her convenience.  Gretta Began, MD 01/20/2016 2:01 PM         Revision History       Date/Time User Action    > 01/20/2016 2:07 PM Larina Earthly, MD Sign     01/20/2016 1:43 PM Raymond Gurney, PA-C Sign at close encounter               Psych  Notes     No notes of this type exist for this encounter.     [facility] Patient Outreach     No notes of this type exist for this encounter.     Not recorded        Referring Provider     Lindwood Qua, MD     Level of Service     PR OFFICE OUTPATIENT VISIT 25 MINUTES [phone]         All Flowsheet Templates (all recorded)     Amb Nursing Assessment    Anthropometrics    Custom Formula Data    Encounter Vitals    Infectious Disease Screening    MEWS Score      Referring Provider     Lindwood Qua, MD     All Charges for This Encounter     Code Description Service Date Service Provider Modifiers Qty    [phone] PR OFFICE OUTPATIENT VISIT 25 MINUTES 01/20/2016 Larina Earthly, MD  1      Communications Sent     Recipient Method Sent by Date Sent    Lindwood Qua, MD Fax Larina Earthly, MD 01/20/2016    Fax: [phone] Phone: [phone]       AVS Reports     Date/Time Report Action User    01/20/2016 2:44 PM After Visit Summary Printed Margaretmary Eddy      Smoking Cessation Audit Trail       Diabetic Foot Exam    No data filed     Diabetic Foot Form - Detailed    No data filed     Diabetic Foot Exam - Simple    No data filed  Guarantor Account: Margaret, Castillo ([id number])     Relation to Patient: Account Type Service Area    Self Personal/Family Level [place] SERVICE AREA       Coverages for This Account     Coverage ID Payor Plan Insurance ID    [id number] [facility] SHIELD MEDICARE BCBS MEDICARE [id number]        Guarantor Account: Margaret, Castillo ([id number])     Relation to Patient: Account Type Service Area    Self Personal/Family [facility]          Guarantor Account: Margaret, Castillo ([id number])     Relation to Patient: Account Type Service Area    Self Personal/Family [facility]           Guarantor Account: Margaret, Castillo ([id number])     Relation to Patient: Account Type Service Area    Self Personal/Family [place] [facility]          History     Reviewed By Date/Time Sections Reviewed    Gretta Began, MD 01/20/2016 2:01 PM Medical, Surgical, Tobacco, Alcohol, Drug Use, Sexual Activity, Family    Raymond Gurney, PA-C 01/20/2016 1:30 PM Tobacco    Raymond Gurney, PA-C 01/20/2016 1:29 PM Tobacco    MANESS-HARRISON, CHANDA C, CMA 01/20/2016 12:56 PM Tobacco    MANESS-HARRISON, CHANDA C, CMA 01/20/2016 12:54 PM Tobacco    MANESS-HARRISON, CHANDA C, CMA 01/20/2016 12:47 PM Tobacco      Medical Necessity Transport Form     Medical Necessity        History and Physical Interval Note:  02/29/2016 6:58 AM  Weldon Picking  has presented today for surgery, with the diagnosis of Abdominal Aortic Aneurysm Stent Graft with Endoleak T82.539  The various methods of treatment have been discussed with the patient and family. After consideration of risks, benefits and other options for treatment, the patient has consented to  Procedure(s): Aortic cuff/Gore (N/A) as a surgical intervention .  The patient's history has been reviewed, patient examined, no change in status, stable for surgery.  I have reviewed the patient's chart and labs.  Questions were answered to the patient's satisfaction.     Gretta Began

## 2016-02-29 NOTE — Progress Notes (Signed)
[facility] [place]  Subjective  - Doing well a little sore in groins.   Objective 132/66 74 98.2 F (36.8 C) (Oral) 19 99%  Intake/Output Summary (Last 24 hours) at 02/29/16 1436 Last data filed at 02/29/16 1400  Gross per 24 hour  Intake   1920 ml  Output   1450 ml  Net    470 ml    Groins soft Palpable 2+ DP Active range of motion of feet intact  Assessment/Planning: S/P ABDOMINAL AORTIC ENDOVASCULAR STENT GRAFT,  Stable disposition  Margaret Castillo, Nollie Shiflett MAUREEN 02/29/2016 2:36 PM --  Laboratory Lab Results:  Recent Labs  02/29/16 1146  WBC 10.4  HGB 8.7*  HCT 26.9*  PLT 197   BMET  Recent Labs  02/29/16 1146  NA 142  K 3.7  CL 114*  CO2 23  GLUCOSE 90  BUN 5*  CREATININE 0.53  CALCIUM 8.4*    COAG Lab Results  Component Value Date   INR 1.26 02/29/2016   INR 1.09 02/25/2016   INR 1.20 02/04/2016   No results found for: PTT

## 2016-02-29 NOTE — Progress Notes (Signed)
Pt c/o pain in LUE & that it "feels cold". IVF bolus is infusing for SBP < 100.  IV site looks good, good blood return, 2+ brachila and radial pulse. Warm blanket to site. Will cont to monitor closely.

## 2016-02-29 NOTE — Progress Notes (Signed)
Pharmacy Consult: Antibiotics renal dose adjustment  80 YOF s/p ABDOMINAL AORTIC ENDOVASCULAR STENT GRAFT, getting vancomycin for post-op prophylaxis. Pharmacy is consulted for antibiotics renal dose adjustment. Scr 0.53, est. crcl 63 ml/min. Received pre-op dose vancomycin 1 gram at 0720.  Plan: Vancomycin 1 gram q 12 hours x 2 doses as ordered, next dose due at 1930, no adjustment needed. Pharmacy sign off.   Thank you for allowing us to participate in this patients care. Signe Coltonya C Brunette Lavalle, PharmD Pager: [phone]

## 2016-02-29 NOTE — Anesthesia Preprocedure Evaluation (Addendum)
Anesthesia Evaluation  Patient identified by MRN, date of birth, ID band Patient awake    Reviewed: Allergy & Precautions, NPO status , Patient's Chart, lab work & pertinent test results  History of Anesthesia Complications Negative for: history of anesthetic complications  Airway Mallampati: II  TM Distance: >3 FB Neck ROM: Full    Dental  (+) Teeth Intact, Dental Advisory Given   Pulmonary neg shortness of breath, neg sleep apnea, neg COPD, neg recent URI, former smoker,    breath sounds clear to auscultation       Cardiovascular + Past MI and + Peripheral Vascular Disease   Rhythm:Regular     Neuro/Psych negative neurological ROS  negative psych ROS   GI/Hepatic negative GI ROS,   Endo/Other  Hypothyroidism   Renal/GU      Musculoskeletal  (+) Arthritis ,   Abdominal   Peds  Hematology  (+) anemia ,   Anesthesia Other Findings   Reproductive/Obstetrics                           Anesthesia Physical Anesthesia Plan  ASA: III  Anesthesia Plan: General   Post-op Pain Management:    Induction: Intravenous  Airway Management Planned: Oral ETT  Additional Equipment: Arterial line  Intra-op Plan:   Post-operative Plan: Extubation in OR  Informed Consent: I have reviewed the patients History and Physical, chart, labs and discussed the procedure including the risks, benefits and alternatives for the proposed anesthesia with the patient or authorized representative who has indicated his/her understanding and acceptance.   Dental advisory given  Plan Discussed with: CRNA, Anesthesiologist and Surgeon  Anesthesia Plan Comments:        Anesthesia Quick Evaluation

## 2016-02-29 NOTE — H&P (View-Only) (Signed)
Patient ID: Margaret Castillo, female   DOB: 1935-06-28, 81 y.o.   MRN: [id number] Comfortable this morning. No further nausea or weakness. Right groin puncture site without hematoma Remains hemodynamically stable. Will mobilize this morning and plan discharge after breakfast.  Discussed in general findings with the patient and her son present. Appears as though she has a type I endoleak on the posterior wall of the aorta to stent graft junction. We will plan aortic cuff placement in the operating room in the next 1-2 weeks at their convenience. We'll schedule this for surgery.

## 2016-02-29 NOTE — Anesthesia Procedure Notes (Signed)
Procedure Name: Intubation Date/Time: 02/29/2016 7:42 AM Performed by: Charm BargesBUTLER, Omarrion Carmer R Pre-anesthesia Checklist: Patient identified, Emergency Drugs available, Suction available and Patient being monitored Patient Re-evaluated:Patient Re-evaluated prior to inductionOxygen Delivery Method: Circle System Utilized Preoxygenation: Pre-oxygenation with 100% oxygen Intubation Type: IV induction Ventilation: Mask ventilation without difficulty Laryngoscope Size: Mac and 4 Grade View: Grade I Tube type: Oral Tube size: 7.5 mm Number of attempts: 1 Airway Equipment and Method: Stylet and Oral airway Placement Confirmation: ETT inserted through vocal cords under direct vision,  positive ETCO2 and breath sounds checked- equal and bilateral Secured at: 21 cm Tube secured with: Tape Dental Injury: Teeth and Oropharynx as per pre-operative assessment

## 2016-02-29 NOTE — Transfer of Care (Signed)
Immediate Anesthesia Transfer of Care Note  Patient: Margaret Castillo  Procedure(s) Performed: Procedure(s): Aortic cuff/Gore (N/A)  Patient Location: PACU  Anesthesia Type:General  Level of Consciousness: awake, oriented and patient cooperative  Airway & Oxygen Therapy: Patient Spontanous Breathing and Patient connected to nasal cannula oxygen  Post-op Assessment: Report given to RN, Post -op Vital signs reviewed and stable and Patient moving all extremities  Post vital signs: Reviewed and stable  Last Vitals:  Filed Vitals:   02/29/16 0616  BP: 190/42  Pulse: 60  Temp: 37.1 C  Resp: 18    Last Pain: There were no vitals filed for this visit.       Complications: No apparent anesthesia complications

## 2016-02-29 NOTE — Progress Notes (Signed)
Since IVF bolus completed, pt states LUE "feels better". Will continue to monitor. Pt says "they have a hard time getting blood pressure in my arms", and that "they usually have been taking it in my left leg." 166/70 in LLE; 102/ 58 in RUE' 102-106/ 50's by left radial arterial line.

## 2016-02-29 NOTE — Progress Notes (Signed)
Through OR nurse Dorene GrebeNatalie, Dr Early updated re differences in cuff, leg, art. Line BP's. Dr Maple HudsonMoser updated re pt's earlier LUE discomfort which has since resolved.

## 2016-02-29 NOTE — Op Note (Signed)
    OPERATIVE REPORT  DATE OF SURGERY: 02/29/2016  PATIENT: Margaret Castillo, 81 y.o. female MRN: [id number]  DOB: 12-06-1934  PRE-OPERATIVE DIAGNOSIS: Type I endoleak from prior Gore stent graft repair of abdominal aortic aneurysm  POST-OPERATIVE DIAGNOSIS:  Same  PROCEDURE: Aortic cuff placement proximally from prior stent graft  SURGEON:  Gretta Beganodd Carleen Rhue, M.D.  PHYSICIAN ASSISTANT: Lianne CureMaureen Collins PA-C  ANESTHESIA:  Gen.  EBL: Minimal ml  Total I/O In: 1000 [I.V.:1000] Out: 700 [Urine:650; Blood:50]  BLOOD ADMINISTERED: None  DRAINS: None  SPECIMEN: None  COUNTS CORRECT:  YES  PLAN OF CARE: PACU   PATIENT DISPOSITION:  PACU - hemodynamically stable  PROCEDURE DETAILS: Patient was taken to the operative placed supine position where the area both groins prepped draped in usual sterile fashion. SonoSite ultrasound was used to visualize the right common femoral artery. Using 18-gauge needle the right common femoral artery was accessed and a guidewire was passed up to the level of the suprarenal aorta. This was confirmed with fluoroscopy. A 5 French sheath was passed over the guidewire. Next SonoSite was used to visualize the left common femoral artery. Again an 18-gauge needle was used to access the left common femoral artery. A guidewire was passed through the 18-gauge needle to the level of the suprarenal aorta and this was confirmed with fluoroscopy. 2 separate those devices were positioned at 10:00 and 2:00 position and were partially deployed for later closure. A 8 French sheath was used over the guidewire. A pigtail catheter was positioned through the right groin over the guidewire to the level of the suprarenal aorta. On the left the Bentson wire was exchanged for an Amplatz superstiff wire through a directional catheter. An 6618 French sheath was then exchanged for the 8 [place] sheath. The aortic cuff was a 28 mm x 3.3 cm. This was positioned at the level of the prior proximal  portion of the stent graft. 30 cranial projection was used to image the level of the renal artery takeoffs. This was noted on the screen and the cuff was positioned to land just below the left renal artery which was the lowest renal artery. Aortic cuff was deployed. The pigtail catheter was then withdrawn back into the aortic graft below the cuff after straighten it with the Bentson wire. This was then repositioned above the level of the new cuff. A 250 balloon was used to dilate the new cuff. Completion arteriogram revealed excellent positioning with no impingement on the renal arteries. There was no evidence of endoleak. The 3618 French sheath was with withdrawn and the prior placed Perclose devices were tightened for hemostasis. The right femoral sheath was pulled and hemostasis obtained with pressure. The patient was given 6000 units intravenous heparin intravenous heparin prior to placement of the 18 French sheath. Patient was given 100 mg of protamine to reverse the heparin. After adequate hemostasis and both groins was obtained the patient was extubated and transferred to the recovery room in stable condition   Gretta Beganodd Jashay Roddy, M.D. 02/29/2016 9:54 AM

## 2016-02-29 NOTE — Interval H&P Note (Signed)
History and Physical Interval Note:  02/29/2016 6:57 AM  [name]  has presented today for surgery, with the diagnosis of Abdominal Aortic Aneurysm Stent Graft with Endoleak T82.539  The various methods of treatment have been discussed with the patient and family. After consideration of risks, benefits and other options for treatment, the patient has consented to  Procedure(s): Aortic cuff/Gore (N/A) as a surgical intervention .  The patient's history has been reviewed, patient examined, no change in status, stable for surgery.  I have reviewed the patient's chart and labs.  Questions were answered to the patient's satisfaction.     Gretta BeganEarly, Ardine Iacovelli

## 2016-02-29 NOTE — Progress Notes (Signed)
Care of pt assumed by MA Carmino Ocain RN from R. Hunt RN. 

## 2016-03-01 ENCOUNTER — Other Ambulatory Visit: Payer: Self-pay | Admitting: *Deleted

## 2016-03-01 ENCOUNTER — Encounter (HOSPITAL_COMMUNITY): Payer: Self-pay | Admitting: Vascular Surgery

## 2016-03-01 DIAGNOSIS — I714 Abdominal aortic aneurysm, without rupture, unspecified: Secondary | ICD-10-CM

## 2016-03-01 DIAGNOSIS — Z48812 Encounter for surgical aftercare following surgery on the circulatory system: Secondary | ICD-10-CM

## 2016-03-01 LAB — TYPE AND SCREEN
ABO/RH(D): A POS
Antibody Screen: NEGATIVE
UNIT DIVISION: 0
Unit division: 0

## 2016-03-01 LAB — BASIC METABOLIC PANEL
ANION GAP: 7 (ref 5–15)
BUN: 5 mg/dL — AB (ref 6–20)
CALCIUM: 8.1 mg/dL — AB (ref 8.9–10.3)
CO2: 21 mmol/L — ABNORMAL LOW (ref 22–32)
Chloride: 108 mmol/L (ref 101–111)
Creatinine, Ser: 0.63 mg/dL (ref 0.44–1.00)
GFR calc Af Amer: >60 mL/min (ref >60)
GLUCOSE: 87 mg/dL (ref 65–99)
Potassium: 4 mmol/L (ref 3.5–5.1)
Sodium: 136 mmol/L (ref 135–145)

## 2016-03-01 LAB — CBC
HCT: 24.9 % — ABNORMAL LOW (ref 36.0–46.0)
Hemoglobin: 8 g/dL — ABNORMAL LOW (ref 12.0–15.0)
MCH: 34 pg (ref 26.0–34.0)
MCHC: 32.1 g/dL (ref 30.0–36.0)
MCV: 106 fL — AB (ref 78.0–100.0)
PLATELETS: 199 10*3/uL (ref 150–400)
RBC: 2.35 MIL/uL — ABNORMAL LOW (ref 3.87–5.11)
RDW: 16 % — AB (ref 11.5–15.5)
WBC: 6.6 10*3/uL (ref 4.0–10.5)

## 2016-03-01 MED ORDER — OXYCODONE-ACETAMINOPHEN 5-325 MG PO TABS: 1.0000 | ORAL_TABLET | Freq: Four times a day (QID) | ORAL | Status: DC | PRN

## 2016-03-01 NOTE — Anesthesia Postprocedure Evaluation (Signed)
Anesthesia Post Note  Patient: Margaret Castillo  Procedure(s) Performed: Procedure(s) (LRB): Aortic cuff/Gore (N/A)  Patient location during evaluation: PACU Anesthesia Type: General Level of consciousness: awake Pain management: pain level controlled Vital Signs Assessment: post-procedure vital signs reviewed and stable Respiratory status: spontaneous breathing Cardiovascular status: stable Postop Assessment: no signs of nausea or vomiting Anesthetic complications: no    Last Vitals:  Filed Vitals:   03/01/16 0620 03/01/16 0735  BP: 143/57 142/69  Pulse: 76 69  Temp:    Resp: 20 14    Last Pain:  Filed Vitals:   03/01/16 0745  PainSc: 0-No pain                 Adamariz Gillott

## 2016-03-01 NOTE — Progress Notes (Signed)
Received order to discharge patient.  IVs removed.  Provided pt and son with discharge paperwork, reviewed education, and answered all questions.

## 2016-03-01 NOTE — Progress Notes (Addendum)
[facility] [place]  Subjective  - Doing well ambulating, taking PO's well pending voiding.   Objective 143/57 76 99.7 F (37.6 C) (Oral) 20 96%  Intake/Output Summary (Last 24 hours) at 03/01/16 0700 Last data filed at 03/01/16 0400  Gross per 24 hour  Intake 4424.17 ml  Output   2725 ml  Net 1699.17 ml    Palpable DP pulses 3+ bilaterally Groin incisions clean and dry   Assessment/Planning: POD # AAA EVAR cuff  Disposition stable for discharge pending independent void F/U in 4 weeks with CTA abd/pelvis Dr. Fayne MediateEarly  COLLINS, EMMA [facility] 03/01/2016 7:00 AM --  Laboratory Lab Results:  Recent Labs  02/29/16 1146 03/01/16 0355  WBC 10.4 6.6  HGB 8.7* 8.0*  HCT 26.9* 24.9*  PLT 197 199   BMET  Recent Labs  02/29/16 1146 03/01/16 0355  NA 142 136  K 3.7 4.0  CL 114* 108  CO2 23 21*  GLUCOSE 90 87  BUN 5* 5*  CREATININE 0.53 0.63  CALCIUM 8.4* 8.1*    COAG Lab Results  Component Value Date   INR 1.26 02/29/2016   INR 1.09 02/25/2016   INR 1.20 02/04/2016   No results found for: PTT    I have examined the patient, reviewed and agree with above.  Gretta BeganEarly, Jason Hauge, MD 03/01/2016 9:51 AM

## 2016-03-02 ENCOUNTER — Telehealth: Payer: Self-pay | Admitting: Vascular Surgery

## 2016-03-02 NOTE — Telephone Encounter (Signed)
-----   Message from Sharee PimpleMarilyn K McChesney, RN sent at 03/01/2016 10:57 AM EDT ----- Regarding: 4 week post EVAR w/ CTA   ----- Message -----    From: Lars MageEmma M Collins, PA-C    Sent: 03/01/2016   7:05 AM      To: Vvs Charge Pool  S/P EVAR f/u in 4 weeks with CTA abd/pelvis Dr. Arbie CookeyEarly

## 2016-03-02 NOTE — Telephone Encounter (Signed)
[facility] MD at 2:45. Spoke to pt to inform them of appts.

## 2016-03-14 NOTE — Discharge Summary (Signed)
[facility] Discharge Summary   Patient ID:  Margaret Castillo MRN: [id number] DOB/AGE: 30-Aug-1934 81 y.o.  Admit date: 02/29/2016 Discharge date: 03/01/2016 Date of Surgery: 02/29/2016 Surgeon: Surgeon(s): Larina Earthly, MD  Admission Diagnosis: Abdominal Aortic Aneurysm Stent Graft with Endoleak T82.539  Discharge Diagnoses:  Abdominal Aortic Aneurysm Stent Graft with Endoleak T82.539  Secondary Diagnoses: Past Medical History:  Diagnosis Date  . AAA (abdominal aortic aneurysm) (HCC)   . Anemia   . Arthritis    Neck  . Carotid artery occlusion   . Hearing aid worn   . Hypothyroidism   . Myocardial infarction (HCC) 1982  . Polymyalgia rheumatica (HCC)     Procedure(s): Aortic cuff/Gore  Discharged Condition: good  HPI:  Margaret Castillo is a 81 y.o. female who presents for discussion of her CTA results. The patient underwent endovascular stent graft repair of her AAA in 2008. The patient recently went to [facility] for evaluation of flank pain and abdominal pain. On a noncontrast CT, this revealed an enlargement of her native aneurysm sac. She was sent for a CTA for further evaluation. She has been followed annually with a duplex of her abdomen. Her last EVAR duplex revealed a maximum diameter 4.5 cm.   The patient denies any further abdominal pain.   Hospital Course:  Margaret Castillo is a 81 y.o. female is S/P  Procedure(s): Aortic cuff/Gore  Palpable DP pulses 3+ bilaterally Groin incisions clean and dry   Assessment/Planning: POD # AAA EVAR cuff  Disposition stable for discharge pending independent void F/U in 4 weeks with CTA abd/pelvis Dr. Arbie Cookey    Significant Diagnostic Studies: CBC Lab Results  Component Value Date   WBC 6.6 03/01/2016   HGB 8.0 (L) 03/01/2016   HCT 24.9 (L) 03/01/2016   MCV 106.0 (H) 03/01/2016   PLT 199 03/01/2016    BMET    Component Value Date/Time   NA 136 03/01/2016 0355   K 4.0 03/01/2016 0355   CL  108 03/01/2016 0355   CO2 21 (L) 03/01/2016 0355   GLUCOSE 87 03/01/2016 0355   BUN 5 (L) 03/01/2016 0355   CREATININE 0.63 03/01/2016 0355   CALCIUM 8.1 (L) 03/01/2016 0355   GFRNONAA >60 03/01/2016 0355   GFRAA >60 03/01/2016 0355   COAG Lab Results  Component Value Date   INR 1.26 02/29/2016   INR 1.09 02/25/2016   INR 1.20 02/04/2016     Disposition:  Discharge to :Home Discharge Instructions    Call MD for:  redness, tenderness, or signs of infection (pain, swelling, bleeding, redness, odor or green/yellow discharge around incision site)    Complete by:  As directed   Call MD for:  severe or increased pain, loss or decreased feeling  in affected limb(s)    Complete by:  As directed   Call MD for:  temperature >100.5    Complete by:  As directed   Discharge instructions    Complete by:  As directed   You may shower in 24 hours, dry groin area well.   Discharge patient    Complete by:  As directed   Discharge pt to home once she has voided.   Driving Restrictions    Complete by:  As directed   No driving for 2 weeks   Lifting restrictions    Complete by:  As directed   No lifting for 4 weeks   Resume previous diet    Complete by:  As directed  Medication List    TAKE these medications   acetaminophen 500 MG tablet Commonly known as:  TYLENOL Take 500-1,000 mg by mouth as needed for mild pain.   aspirin EC 81 MG tablet Take 81 mg by mouth daily.   ciprofloxacin 500 MG tablet Commonly known as:  CIPRO Take 1 tablet (500 mg total) by mouth 2 (two) times daily.   cyanocobalamin 1000 MCG/ML injection Commonly known as:  (VITAMIN B-12) Inject 1,000 mcg into the muscle every 30 (thirty) days.   ferrous sulfate 325 (65 FE) MG tablet Take 325 mg by mouth daily with breakfast.   meclizine 25 MG tablet Commonly known as:  ANTIVERT Take 12.5 mg by mouth 2 (two) times daily as needed for dizziness.   oxyCODONE-acetaminophen 5-325 MG tablet Commonly known as:   PERCOCET/ROXICET Take 1 tablet by mouth every 6 (six) hours as needed for moderate pain.   predniSONE 5 MG tablet Commonly known as:  DELTASONE Take 5 mg by mouth daily.   SYNTHROID 112 MCG tablet Generic drug:  levothyroxine Take 112 mcg by mouth daily. Patient alternates with every other day   SYNTHROID 100 MCG tablet Generic drug:  levothyroxine Take 100 mcg by mouth every other day. Alternating with 112 mcg   triamcinolone cream 0.1 % Commonly known as:  KENALOG Apply 1 application topically as needed.   Vitamin D (Ergocalciferol) 50000 units Caps capsule Commonly known as:  DRISDOL Take 50,000 Units by mouth every Wednesday.      Verbal and written Discharge instructions given to the patient. Wound care per Discharge AVS Follow-up Information    Early, Todd, MD In 4 weeks.   Specialties:  Vascular Surgery, Cardiology Why:  Office will call you to arrange your appt (sent) Contact information: [address] [place] [address] [phone]           Signed: Clinton Gallant [facility] 03/14/2016, 9:33 AM - For VQI Registry use --- Instructions: Press F2 to tab through selections.  Delete question if not applicable.   Post-op:  Time to Extubation: [x ] In OR,  < 12 hrs,  12-24 hrs,  >=24 hrs Vasopressors Req. Post-op: No MI:  No,  Troponin only,  EKG or Clinical New Arrhythmia: No CHF: No ICU Stay: 0 days Transfusion: No  If yes, 0 units given  Complications: Resp failure: [x ] none,  Pneumonia,  Ventilator Chg in renal function: [x ] none,  Inc. Cr > 0.5,  Temp. Dialysis,  Permanent dialysis Leg ischemia: [x ] No,  Yes, no Surgery needed,  Yes, Surgery needed,  Amputation Bowel ischemia: [x ] No,  Medical Rx,  Surgical Rx Wound complication: [x ] No,  Superficial separation/infection,  Return to OR Return to OR: No  Return to OR for bleeding: No Stroke: [x ] None,  Minor,  Major  Discharge  medications: Statin use:  No  for medical reason   ASA use:  Yes Plavix use:  No  for medical reason   Beta blocker use:  No  for medical reason

## 2016-03-31 ENCOUNTER — Encounter: Payer: Self-pay | Admitting: Vascular Surgery

## 2016-04-05 ENCOUNTER — Ambulatory Visit (INDEPENDENT_AMBULATORY_CARE_PROVIDER_SITE_OTHER): Payer: Medicare Other | Admitting: Vascular Surgery

## 2016-04-05 ENCOUNTER — Encounter: Payer: Self-pay | Admitting: Vascular Surgery

## 2016-04-05 ENCOUNTER — Ambulatory Visit
Admission: RE | Admit: 2016-04-05 | Discharge: 2016-04-05 | Disposition: A | Discharge status: Home / Self Care | Payer: Medicare Other | Source: Ambulatory Visit | Attending: Vascular Surgery | Admitting: Vascular Surgery

## 2016-04-05 VITALS — BP 116/82 | HR 75 | Ht 66.0 in | Wt 186.3 lb

## 2016-04-05 DIAGNOSIS — I714 Abdominal aortic aneurysm, without rupture, unspecified: Secondary | ICD-10-CM

## 2016-04-05 DIAGNOSIS — Z48812 Encounter for surgical aftercare following surgery on the circulatory system: Secondary | ICD-10-CM

## 2016-04-05 MED ORDER — IOPAMIDOL (ISOVUE-370) INJECTION 76%
75.0000 mL | Freq: Once | INTRAVENOUS | Status: AC | PRN
  Administered 2016-04-05: 75 mL via INTRAVENOUS

## 2016-04-05 NOTE — Progress Notes (Signed)
   Patient name: Margaret Castillo MRN: [id number] DOB: [birth year] Sex: female  REASON FOR VISIT: Follow-up of aortic cuff placement of proximal prior stent graft repair on 02/29/2016  HPI: Margaret Castillo is a 81 y.o. female status post a stent graft repair of abdominal aortic aneurysm in 2008. She was found to have expansion and her aneurysm sac and the CT and formal arteriogram shows evidence of possible type I endoleak. She's taken to the operating room one month ago and underwent aortic cuff placement at the level just below her renal arteries. She did well discharged home on postoperative day 1.  Current Outpatient Prescriptions  Medication Sig Dispense Refill  . acetaminophen (TYLENOL) 500 MG tablet Take 500-1,000 mg by mouth as needed for mild pain.     Marland Kitchen. aspirin EC 81 MG tablet Take 81 mg by mouth daily.    . ciprofloxacin (CIPRO) 500 MG tablet Take 1 tablet (500 mg total) by mouth 2 (two) times daily. 28 tablet 0  . cyanocobalamin (,VITAMIN B-12,) 1000 MCG/ML injection Inject 1,000 mcg into the muscle every 30 (thirty) days.     . ferrous sulfate 325 (65 FE) MG tablet Take 325 mg by mouth daily with breakfast.    . levothyroxine (SYNTHROID) 112 MCG tablet Take 112 mcg by mouth daily. Patient alternates with 100mcg every other day    . meclizine (ANTIVERT) 25 MG tablet Take 12.5 mg by mouth 2 (two) times daily as needed for dizziness.     . predniSONE (DELTASONE) 5 MG tablet Take 5 mg by mouth daily.    Marland Kitchen. SYNTHROID 100 MCG tablet Take 100 mcg by mouth every other day. Alternating with 112 mcg  2  . triamcinolone cream (KENALOG) 0.1 % Apply 1 application topically as needed.     . Vitamin D, Ergocalciferol, (DRISDOL) 50000 UNITS CAPS Take 50,000 Units by mouth every Wednesday.     Marland Kitchen. oxyCODONE-acetaminophen (PERCOCET/ROXICET) 5-325 MG tablet Take 1 tablet by mouth every 6 (six) hours as needed for moderate pain. (Patient not taking: Reported on 04/05/2016) 6  tablet 0   No current facility-administered medications for this visit.      PHYSICAL EXAM: Vitals:   04/05/16 1437  BP: 116/82  Pulse: 75  SpO2: 95%  Weight: 186 lb 4.8 oz (84.5 kg)  Height: 5\' 6"  (1.676 m)    GENERAL: The patient is a well-nourished female, in no acute distress. The vital signs are documented above. Groin incision puncture sites are healed without evidence of false aneurysm. She has easily palpable popliteal pulses bilaterally  Repeat CT scan today shows no change in her aneurysm sac maximal diameter 5.7 cm. She continues to have a persistent type II endoleak but appear to be related to lumbar vessels and the proximal aorta.  MEDICAL ISSUES: Discussed this at length with the patient and her son present. I will see her again in 6 months with repeat CT scan to determine her aneurysm sac size.   Larina Earthlyodd F. Ceriah Kohler, MD FACS [facility] Office Tel [phone]) [phone] Pager [phone]) [phone]

## 2016-04-12 ENCOUNTER — Other Ambulatory Visit: Payer: Self-pay | Admitting: *Deleted

## 2016-04-12 DIAGNOSIS — I714 Abdominal aortic aneurysm, without rupture, unspecified: Secondary | ICD-10-CM

## 2016-06-16 ENCOUNTER — Encounter: Payer: Self-pay | Admitting: Vascular Surgery

## 2016-06-21 ENCOUNTER — Encounter: Payer: Self-pay | Admitting: Vascular Surgery

## 2016-06-21 ENCOUNTER — Other Ambulatory Visit: Payer: Self-pay | Admitting: *Deleted

## 2016-06-21 ENCOUNTER — Ambulatory Visit (HOSPITAL_COMMUNITY)
Admission: RE | Admit: 2016-06-21 | Discharge: 2016-06-21 | Disposition: A | Discharge status: Home / Self Care | Payer: Medicare Other | Source: Ambulatory Visit | Attending: Vascular Surgery | Admitting: Vascular Surgery

## 2016-06-21 ENCOUNTER — Ambulatory Visit (INDEPENDENT_AMBULATORY_CARE_PROVIDER_SITE_OTHER): Payer: Medicare Other | Admitting: Vascular Surgery

## 2016-06-21 VITALS — BP 113/76 | HR 62 | Temp 96.9°F | Resp 18 | Ht 65.0 in | Wt 184.0 lb

## 2016-06-21 DIAGNOSIS — I6523 Occlusion and stenosis of bilateral carotid arteries: Secondary | ICD-10-CM | POA: Insufficient documentation

## 2016-06-21 DIAGNOSIS — Z48812 Encounter for surgical aftercare following surgery on the circulatory system: Secondary | ICD-10-CM | POA: Diagnosis not present

## 2016-06-21 DIAGNOSIS — I714 Abdominal aortic aneurysm, without rupture, unspecified: Secondary | ICD-10-CM

## 2016-06-21 NOTE — Progress Notes (Signed)
[facility] [place]  Patient name: Margaret PickingLouise T Malerba MRN: [id number] DOB: 1935-06-07 Sex: female  REASON FOR VISIT: Follow-up carotid disease  HPI: Margaret Castillo is a 81 y.o. female here today for carotid duplex follow-up. She is status post left carotid endarterectomy in 2008. Does have known right common carotid artery stenosis by duplex. She's had no neurologic deficits. Specifically she has had no amaurosis fugax, transient ischemic attack or stroke. She has undergone stent graft repair and is being followed in our office for this as well.  Past Medical History:  Diagnosis Date  . AAA (abdominal aortic aneurysm) (HCC)   . Anemia   . Arthritis    Neck  . Carotid artery occlusion   . Hearing aid worn   . Hypothyroidism   . Myocardial infarction 1982  . Polymyalgia rheumatica (HCC)     Family History  Problem Relation Age of Onset  . Heart disease Father     After age 79   . Heart attack Father   . Cancer Sister   . Diabetes Sister   . Cancer Brother   . Cancer Brother   . Heart disease Brother   . Cancer Brother     Prostate  . Diabetes Brother   . Stroke Mother   . Diabetes Mother   . Stroke Sister   . Heart disease Brother   . Arthritis Brother   . Cancer Brother     Prostate  . Heart attack Sister   . Cancer Paternal Grandfather     SOCIAL HISTORY: Social History  Substance Use Topics  . Smoking status: Former Smoker    Quit date: 08/22/1980  . Smokeless tobacco: Never Used  . Alcohol use No    Allergies  Allergen Reactions  . Atorvastatin Other (See Comments)    MUSCLE PAIN (all statins)  . Codeine Nausea Only  . Crestor [Rosuvastatin] Other (See Comments)    Leg pain/ joint pain  ( ALL THE STATIN's )  . Penicillins Rash    Has patient had a PCN reaction causing immediate rash, facial/tongue/throat swelling, SOB or lightheadedness with hypotension: Yes Has patient had a PCN reaction  causing severe rash involving mucus membranes or skin necrosis: No Has patient had a PCN reaction that required hospitalization No Has patient had a PCN reaction occurring within the last 10 years: No If all of the above answers are "NO", then may proceed with Cephalosporin use.   . Adhesive [Tape] Other (See Comments)  . Cephalexin Rash    Current Outpatient Prescriptions  Medication Sig Dispense Refill  . acetaminophen (TYLENOL) 500 MG tablet Take 500-1,000 mg by mouth as needed for mild pain.     Marland Kitchen. aspirin EC 81 MG tablet Take 81 mg by mouth daily.    . cyanocobalamin (,VITAMIN B-12,) 1000 MCG/ML injection Inject 1,000 mcg into the muscle every 30 (thirty) days.     . ferrous sulfate 325 (65 FE) MG tablet Take 325 mg by mouth daily with breakfast.    . levothyroxine (SYNTHROID) 112 MCG tablet Take 112 mcg by mouth daily. Patient alternates with 100mcg every other day    . meclizine (ANTIVERT) 25 MG tablet Take 12.5 mg by mouth 2 (two) times daily as needed for dizziness.     . predniSONE (DELTASONE) 5 MG tablet Take 5 mg by mouth daily.    Marland Kitchen. SYNTHROID 100 MCG tablet Take 100 mcg by mouth every other day. Alternating with 112 mcg  2  . triamcinolone cream (KENALOG) 0.1 % Apply 1 application topically as needed.     . Vitamin D, Ergocalciferol, (DRISDOL) 50000 UNITS CAPS Take 50,000 Units by mouth every Wednesday.     . ciprofloxacin (CIPRO) 500 MG tablet Take 1 tablet (500 mg total) by mouth 2 (two) times daily. (Patient not taking: Reported on 06/21/2016) 28 tablet 0  . oxyCODONE-acetaminophen (PERCOCET/ROXICET) 5-325 MG tablet Take 1 tablet by mouth every 6 (six) hours as needed for moderate pain. (Patient not taking: Reported on 06/21/2016) 6 tablet 0   No current facility-administered medications for this visit.     REVIEW OF SYSTEMS:  [X]  denotes positive finding, [ ]  denotes negative finding Cardiac  Comments:  Chest pain or chest pressure:     Shortness of breath upon  exertion:    Short of breath when lying flat:    Irregular heart rhythm:        Vascular    Pain in calf, thigh, or hip brought on by ambulation:    Pain in feet at night that wakes you up from your sleep:     Blood clot in your veins:    Leg swelling:           PHYSICAL EXAM: Vitals:   06/21/16 1159 06/21/16 1202  BP: 101/76 113/76  Pulse: 62   Resp: 18   Temp: (!) 96.9 F (36.1 C)   TempSrc: Oral   SpO2: 100%   Weight: 184 lb (83.5 kg)   Height: 5\' 5"  (1.651 m)     GENERAL: The patient is a well-nourished female, in no acute distress. The vital signs are documented above. CARDIOVASCULAR: Neck incision well-healed and she has no bruits bilaterally. She does have palpable radial pulses bilaterally PULMONARY: There is good air exchange  MUSCULOSKELETAL: There are no major deformities or cyanosis. NEUROLOGIC: No focal weakness or paresthesias are detected. SKIN: There are no ulcers or rashes noted. PSYCHIATRIC: The patient has a normal affect.  DATA:  Carotid duplex today reveals widely patent left endarterectomy with no evidence of recurrent stenosis. She does have elevated velocities in her proximal common carotid artery.  MEDICAL ISSUES:   Stable carotid disease. Have recommended that we see her again for follow-up with us in 6 months. We will see her with a CT scan of her abdomen and pelvis for follow-up of her stent graft at that same setting.   Larina Earthlyodd F. Toccara Alford, MD FACS [facility] Office Tel [phone] Pager [phone]) [phone]

## 2016-09-06 ENCOUNTER — Telehealth: Payer: Self-pay | Admitting: Vascular Surgery

## 2016-09-06 NOTE — Telephone Encounter (Signed)
-----   Message from Bayard HuggerAllison A Petty, LPN sent at 1/61/09601/16/2018 12:45 PM EST ----- Regarding: FW: Order Review Forde DandyHi Michele,  I scheduled the appt for carotid and md with this pt.  Please schedule the CTA.  Thanks, Revonda StandardAllison ----- Message ----- From: Rudean HaskellMelinda M Reaves Sent: 09/05/2016   3:17 PM To: Bayard HuggerAllison A Petty, LPN Subject: Order Review                                   Should a recall be entered or appointment be schedule for a carotid duplex?  Juliette AlcideMelinda

## 2016-09-06 NOTE — Telephone Encounter (Signed)
[facility]. Mailed CTA letter to pt.

## 2016-09-13 DIAGNOSIS — E538 Deficiency of other specified B group vitamins: Secondary | ICD-10-CM | POA: Diagnosis not present

## 2016-09-26 ENCOUNTER — Inpatient Hospital Stay (HOSPITAL_COMMUNITY)
Admission: EM | Admit: 2016-09-26 | Discharge: 2016-09-29 | DRG: 379 | Disposition: A | Discharge status: Home / Self Care | Payer: MEDICARE | Attending: Family Medicine | Admitting: Family Medicine

## 2016-09-26 ENCOUNTER — Encounter (HOSPITAL_COMMUNITY): Payer: Self-pay | Admitting: Emergency Medicine

## 2016-09-26 DIAGNOSIS — Z7952 Long term (current) use of systemic steroids: Secondary | ICD-10-CM | POA: Diagnosis not present

## 2016-09-26 DIAGNOSIS — I252 Old myocardial infarction: Secondary | ICD-10-CM

## 2016-09-26 DIAGNOSIS — Z87891 Personal history of nicotine dependence: Secondary | ICD-10-CM

## 2016-09-26 DIAGNOSIS — Z79899 Other long term (current) drug therapy: Secondary | ICD-10-CM

## 2016-09-26 DIAGNOSIS — E876 Hypokalemia: Secondary | ICD-10-CM | POA: Diagnosis not present

## 2016-09-26 DIAGNOSIS — I959 Hypotension, unspecified: Secondary | ICD-10-CM | POA: Diagnosis present

## 2016-09-26 DIAGNOSIS — D62 Acute posthemorrhagic anemia: Secondary | ICD-10-CM | POA: Diagnosis present

## 2016-09-26 DIAGNOSIS — Z8679 Personal history of other diseases of the circulatory system: Secondary | ICD-10-CM

## 2016-09-26 DIAGNOSIS — N3941 Urge incontinence: Secondary | ICD-10-CM | POA: Diagnosis present

## 2016-09-26 DIAGNOSIS — Z880 Allergy status to penicillin: Secondary | ICD-10-CM

## 2016-09-26 DIAGNOSIS — K625 Hemorrhage of anus and rectum: Secondary | ICD-10-CM

## 2016-09-26 DIAGNOSIS — Z91048 Other nonmedicinal substance allergy status: Secondary | ICD-10-CM

## 2016-09-26 DIAGNOSIS — Z800 Family history of malignant neoplasm of digestive organs: Secondary | ICD-10-CM

## 2016-09-26 DIAGNOSIS — N814 Uterovaginal prolapse, unspecified: Secondary | ICD-10-CM | POA: Diagnosis present

## 2016-09-26 DIAGNOSIS — I2510 Atherosclerotic heart disease of native coronary artery without angina pectoris: Secondary | ICD-10-CM | POA: Diagnosis present

## 2016-09-26 DIAGNOSIS — M353 Polymyalgia rheumatica: Secondary | ICD-10-CM | POA: Diagnosis present

## 2016-09-26 DIAGNOSIS — K644 Residual hemorrhoidal skin tags: Secondary | ICD-10-CM | POA: Diagnosis not present

## 2016-09-26 DIAGNOSIS — Z9049 Acquired absence of other specified parts of digestive tract: Secondary | ICD-10-CM

## 2016-09-26 DIAGNOSIS — D500 Iron deficiency anemia secondary to blood loss (chronic): Secondary | ICD-10-CM | POA: Diagnosis present

## 2016-09-26 DIAGNOSIS — K648 Other hemorrhoids: Secondary | ICD-10-CM | POA: Diagnosis present

## 2016-09-26 DIAGNOSIS — Z7982 Long term (current) use of aspirin: Secondary | ICD-10-CM

## 2016-09-26 DIAGNOSIS — Z8041 Family history of malignant neoplasm of ovary: Secondary | ICD-10-CM

## 2016-09-26 DIAGNOSIS — Z885 Allergy status to narcotic agent status: Secondary | ICD-10-CM

## 2016-09-26 DIAGNOSIS — K5730 Diverticulosis of large intestine without perforation or abscess without bleeding: Secondary | ICD-10-CM | POA: Diagnosis not present

## 2016-09-26 DIAGNOSIS — K921 Melena: Secondary | ICD-10-CM | POA: Diagnosis not present

## 2016-09-26 DIAGNOSIS — Z888 Allergy status to other drugs, medicaments and biological substances status: Secondary | ICD-10-CM

## 2016-09-26 DIAGNOSIS — Z823 Family history of stroke: Secondary | ICD-10-CM

## 2016-09-26 DIAGNOSIS — K642 Third degree hemorrhoids: Secondary | ICD-10-CM

## 2016-09-26 DIAGNOSIS — R58 Hemorrhage, not elsewhere classified: Secondary | ICD-10-CM | POA: Diagnosis present

## 2016-09-26 DIAGNOSIS — K649 Unspecified hemorrhoids: Secondary | ICD-10-CM | POA: Diagnosis not present

## 2016-09-26 HISTORY — DX: Leakage of aortic (bifurcation) graft (replacement), initial encounter: T82.330A

## 2016-09-26 LAB — COMPREHENSIVE METABOLIC PANEL
ALT: 8 U/L — ABNORMAL LOW (ref 14–54)
ANION GAP: 10 (ref 5–15)
AST: 20 U/L (ref 15–41)
Albumin: 3.8 g/dL (ref 3.5–5.0)
Alkaline Phosphatase: 42 U/L (ref 38–126)
BUN: 10 mg/dL (ref 6–20)
CHLORIDE: 108 mmol/L (ref 101–111)
CO2: 22 mmol/L (ref 22–32)
CREATININE: 0.69 mg/dL (ref 0.44–1.00)
Calcium: 9.6 mg/dL (ref 8.9–10.3)
Glucose, Bld: 121 mg/dL — ABNORMAL HIGH (ref 65–99)
POTASSIUM: 4.2 mmol/L (ref 3.5–5.1)
SODIUM: 140 mmol/L (ref 135–145)
Total Bilirubin: 0.9 mg/dL (ref 0.3–1.2)
Total Protein: 6.9 g/dL (ref 6.5–8.1)

## 2016-09-26 LAB — CBC
HCT: 30.7 % — ABNORMAL LOW (ref 36.0–46.0)
HEMOGLOBIN: 10 g/dL — AB (ref 12.0–15.0)
MCH: 33.6 pg (ref 26.0–34.0)
MCHC: 32.6 g/dL (ref 30.0–36.0)
MCV: 103 fL — AB (ref 78.0–100.0)
PLATELETS: 272 10*3/uL (ref 150–400)
RBC: 2.98 MIL/uL — AB (ref 3.87–5.11)
RDW: 15.8 % — ABNORMAL HIGH (ref 11.5–15.5)
WBC: 9.8 10*3/uL (ref 4.0–10.5)

## 2016-09-26 LAB — HEMOGLOBIN: Hemoglobin: 9.3 g/dL — ABNORMAL LOW (ref 12.0–15.0)

## 2016-09-26 LAB — PROTIME-INR
INR: 1.02
PROTHROMBIN TIME: 13.4 s (ref 11.4–15.2)

## 2016-09-26 LAB — TSH: TSH: 0.283 u[IU]/mL — ABNORMAL LOW (ref 0.350–4.500)

## 2016-09-26 LAB — HEMATOCRIT: HCT: 28.9 % — ABNORMAL LOW (ref 36.0–46.0)

## 2016-09-26 MED ORDER — SODIUM CHLORIDE 0.9 % IV SOLN
INTRAVENOUS | Status: DC
  Administered 2016-09-26 – 2016-09-27 (×2): via INTRAVENOUS

## 2016-09-26 MED ORDER — SODIUM CHLORIDE 0.9% FLUSH
3.0000 mL | Freq: Two times a day (BID) | INTRAVENOUS | Status: DC
  Administered 2016-09-26 – 2016-09-29 (×3): 3 mL via INTRAVENOUS

## 2016-09-26 MED ORDER — LEVOTHYROXINE SODIUM 112 MCG PO TABS
112.0000 ug | ORAL_TABLET | ORAL | Status: DC
Start: 2016-09-27 — End: 2016-09-29
  Administered 2016-09-27 – 2016-09-29 (×2): 112 ug via ORAL
  Filled 2016-09-26 (×2): qty 1

## 2016-09-26 MED ORDER — ACETAMINOPHEN 325 MG PO TABS
650.0000 mg | ORAL_TABLET | Freq: Four times a day (QID) | ORAL | Status: DC | PRN
  Administered 2016-09-26: 650 mg via ORAL
  Filled 2016-09-26: qty 2

## 2016-09-26 MED ORDER — FERROUS SULFATE 325 (65 FE) MG PO TABS
325.0000 mg | ORAL_TABLET | Freq: Every day | ORAL | Status: DC
  Administered 2016-09-27: 325 mg via ORAL
  Filled 2016-09-26: qty 1

## 2016-09-26 MED ORDER — ACETAMINOPHEN 650 MG RE SUPP: 650.0000 mg | Freq: Four times a day (QID) | RECTAL | Status: DC | PRN

## 2016-09-26 MED ORDER — POLYETHYLENE GLYCOL 3350 17 G PO PACK: 17.0000 g | PACK | Freq: Every day | ORAL | Status: DC | PRN

## 2016-09-26 MED ORDER — LEVOTHYROXINE SODIUM 100 MCG PO TABS
100.0000 ug | ORAL_TABLET | ORAL | Status: DC
  Administered 2016-09-28: 100 ug via ORAL
  Filled 2016-09-26: qty 1

## 2016-09-26 MED ORDER — SODIUM CHLORIDE 0.9 % IV BOLUS (SEPSIS)
1000.0000 mL | Freq: Once | INTRAVENOUS | Status: AC
  Administered 2016-09-26: 1000 mL via INTRAVENOUS

## 2016-09-26 MED ORDER — PREDNISONE 5 MG PO TABS
5.0000 mg | ORAL_TABLET | Freq: Every day | ORAL | Status: DC
  Administered 2016-09-27 – 2016-09-29 (×3): 5 mg via ORAL
  Filled 2016-09-26 (×3): qty 1

## 2016-09-26 NOTE — ED Notes (Signed)
Patient stated "my blood pressure is always lower in my left arm. You can use my right arm or one of my legs". "They always tell me to check both sides"

## 2016-09-26 NOTE — ED Notes (Signed)
Pt. Wheeled to restroom at this time. Pt. Educated on clean catch specimen procedure.

## 2016-09-26 NOTE — H&P (Signed)
Family Medicine Teaching [facility] Admission History and Physical Service Pager: [phone]  Patient name: Margaret Castillo Medical record number: [id number] Date of birth: 13-Apr-1935 Age: 82 y.o. Gender: female  Primary Care Provider: Lindwood Qua, MD Consultants: GI Code Status: full   Chief Complaint: rectal bleeding  Assessment and Plan: Margaret Castillo is a 82 y.o. female presenting with rectal bleeding. PMH is significant for polymyalgia rheumatica, AAA (repaired), vasovagal syncope, hypothyroidism, anemia, MI (1982).  Rectal bleeding- began evening of 2/4. Baseline hemoglobin appears to be about 10 per chart review, hemoglobin upon presentation to ED is 10. Patient taking daily iron supplements and monthly B12 injections for anemia. Patient is not tachycardic in ED, although blood pressure low 80's/60's. Received 1 liter fluid bolus for low BP. External hemorrhoid present on rectal exam with minor bleeding evident however due to the amount the patient is describing of BRB per rectum likely internal source. Differential include lower GI bleed vs malignancy vs hemorrhoidal bleeding. Last colonoscopy > 10 years ago.  -place in observation, attending Dr. McDiarmid -monitor on telemetry -vitals per unit -up with assistance -serial H/H, will recheck at 8pm and 5 am -consult GI -if hemorrhoids internal and severely bleeding may need general surgery consult -transfusion threshold <8 due to hx of CAD -consider anoscopy tomorrow morning -FOBT collected, pending  Anemia, multifactorial- Baseline 10. MCV 103. Weekly B12 injections, daily iron -monitor blood count as above -continue daily iron -B12 as outpatient   Hypothroidism- currently taking synthroid 110 qod and 112 qod. Last TSH not documented in chart -continue home synthroid -recheck TSH  Polymyalgia rheumatica- on prednisone 10mg  daily -continue home prednisone -patient should follow up with rheumatology outpatient    CAD- on daily aspirin at home -hold aspirin in setting of bleeding  FEN/GI: NPO pending GI evaluation, maintenance IVF Prophylaxis: SCD's, no pharmalogic VTE due to bleed  Disposition: pending clinical improvement  History of Present Illness:  Margaret Castillo is a 82 y.o. female presenting with rectal bleeding.  Has had bright red blood per rectum since last night. Started last night and happened off and on through the night. This morning around 8 or 9am the bleeding was continuous and alarmed patient. She would notice blood every time she went to the bathroom or passed gas. Two or three times over she was able to get the bleeding to stop by applying pressure to her rectum. Felt weak this morning, her legs were shaky when walking around after waking up. She called her son to bring her to ED. By the time she got here the bleeding had stopped. She is not sure why it's happened but she does have hemorrhoids. Has had minor rectal bleeding in the past but not in this amount of blood. Last time she noticed blood per rectum was maybe a year ago.  Usually has a bowel movement every day. Reports rectal pain due to hemorrhoids, most days with a bowel movement she has pain. She takes iron daily and has green stools. She will spend 45 minutes to an hour having a bowel movement. They are loose stool. She noticed streaks of bright red blood on the tissue paper mixed with stool. Also noticed a large amount of blood in the toilet.   Has had rectal prolapse in the past, along with bladder and uterine prolapse. Wears a pessary for bladder. Has poor bladder control. Prolapse unable to repair surgically due to AAA.   Never had upper endoscopy. Had colonoscopy <10 years ago. Denies abnormal  findings on this study.   Son Margaret Castillo POA   Review Of Systems: Per HPI with the following additions:   Review of Systems  Constitutional: Positive for weight loss. Negative for chills and fever.  HENT: Positive for hearing  loss.   Eyes: Negative for blurred vision.  Respiratory: Negative for shortness of breath.   Cardiovascular: Negative for chest pain and palpitations.  Gastrointestinal: Positive for blood in stool and diarrhea. Negative for abdominal pain, heartburn, nausea and vomiting.       Rectal pain  Genitourinary: Negative for dysuria.       Incontinence   Musculoskeletal: Negative for falls.  Neurological: Positive for weakness. Negative for dizziness and focal weakness.  Psychiatric/Behavioral: Negative for substance abuse.    Patient Active Problem List   Diagnosis Date Noted  . AAA (abdominal aortic aneurysm) (HCC) 02/29/2016  . Syncope, vasovagal 02/03/2016  . Pain in limb-Left knee 08/13/2013  . Aftercare following surgery of the circulatory system, NEC 08/13/2013  . Occlusion and stenosis of carotid artery without mention of cerebral infarction 08/09/2012  . Dizziness 08/09/2012    Past Medical History: Past Medical History:  Diagnosis Date  . AAA (abdominal aortic aneurysm) (HCC)   . Anemia   . Arthritis    Neck  . Carotid artery occlusion   . Hearing aid worn   . Hypothyroidism   . Myocardial infarction 1982  . Polymyalgia rheumatica (HCC)     Past Surgical History: Past Surgical History:  Procedure Laterality Date  . ABDOMINAL AORTIC ANEURYSM REPAIR  2008   Infrarenal aneurysm stent graft repair  . ABDOMINAL AORTIC ENDOVASCULAR STENT GRAFT N/A 02/29/2016   Procedure: Aortic cuff/Gore;  Surgeon: Larina Earthlyodd F Early, MD;  [facility];  Service: Vascular;  Laterality: N/A;  . APPENDECTOMY    . CAROTID ENDARTERECTOMY  12-07-06   Left CEA  . CARPAL TUNNEL RELEASE Left   . CHOLECYSTECTOMY     Gall Bladder  . EYE SURGERY Bilateral    Cataract  . PERIPHERAL VASCULAR CATHETERIZATION N/A 02/03/2016   Procedure: Abdominal Aortogram;  Surgeon: Pryor OchoaJames D Lawson, MD;  [facility];  Service: Cardiovascular;  Laterality: N/A;  . PERIPHERAL VASCULAR CATHETERIZATION  Bilateral 02/03/2016   Procedure: Lower Extremity Angiography;  Surgeon: Pryor OchoaJames D Lawson, MD;  [facility];  Service: Cardiovascular;  Laterality: Bilateral;  . TONSILLECTOMY      Social History: Social History  Substance Use Topics  . Smoking status: Former Smoker    Quit date: 08/22/1980  . Smokeless tobacco: Never Used  . Alcohol use No   Additional social history: normally lives alone. Younger son living with her currently but he works 3rd shift so not home at odd hours/odd sleep schedule 10-15 years of smoking history, about a half a pack a day.  Please also refer to relevant sections of EMR.  Family History: Family History  Problem Relation Age of Onset  . Heart disease Father     After age 80   . Heart attack Father   . Cancer Sister   . Diabetes Sister   . Cancer Brother   . Cancer Brother   . Heart disease Brother   . Cancer Brother     Prostate  . Diabetes Brother   . Stroke Mother   . Diabetes Mother   . Stroke Sister   . Heart disease Brother   . Arthritis Brother   . Cancer Brother     Prostate  . Heart  attack Sister   . Cancer Paternal Grandfather    Sister- ovarian cancer. Brother had colon cancer, unknown age, he is alive   Allergies and Medications: Allergies  Allergen Reactions  . Atorvastatin Other (See Comments)    MUSCLE PAIN (all statins)  . Codeine Nausea Only  . Crestor [Rosuvastatin] Other (See Comments)    Leg pain/ joint pain  ( ALL THE STATIN's )  . Penicillins Rash    Has patient had a PCN reaction causing immediate rash, facial/tongue/throat swelling, SOB or lightheadedness with hypotension: Yes Has patient had a PCN reaction causing severe rash involving mucus membranes or skin necrosis: No Has patient had a PCN reaction that required hospitalization No Has patient had a PCN reaction occurring within the last 10 years: No If all of the above answers are "NO", then may proceed with Cephalosporin use.   . Adhesive  [Tape] Other (See Comments)  . Cephalexin Rash   No current facility-administered medications on file prior to encounter.    Current Outpatient Prescriptions on File Prior to Encounter  Medication Sig Dispense Refill  . acetaminophen (TYLENOL) 500 MG tablet Take 500-1,000 mg by mouth as needed for mild pain.     Marland Kitchen aspirin EC 81 MG tablet Take 81 mg by mouth daily.    . cyanocobalamin (,VITAMIN B-12,) 1000 MCG/ML injection Inject 1,000 mcg into the muscle every 30 (thirty) days.     . ferrous sulfate 325 (65 FE) MG tablet Take 325 mg by mouth daily with breakfast.    . levothyroxine (SYNTHROID) 112 MCG tablet Take 112 mcg by mouth daily. Takes on odd days, takes on even days    . meclizine (ANTIVERT) 25 MG tablet Take 12.5 mg by mouth 2 (two) times daily as needed for dizziness.     . predniSONE (DELTASONE) 5 MG tablet Take 5 mg by mouth daily.    Marland Kitchen triamcinolone cream (KENALOG) 0.1 % Apply 1 application topically as needed.     . Vitamin D, Ergocalciferol, (DRISDOL) 50000 UNITS CAPS Take 50,000 Units by mouth every 14 (fourteen) days.       Objective: BP 113/78   Pulse 80   Temp 98.5 F (36.9 C) (Oral)   Resp 18   Ht 5\' 6"  (1.676 m)   Wt 187 lb (84.8 kg)   SpO2 97%   BMI 30.18 kg/m  Exam: General: elderly lady, pale appearing, laying in bed in NAD Eyes: pale conjunctiva, sclera white. PERRLA, EOMI ENTM: moist mucous membranes, no erythema or discharge in posterior oropharynx Neck: supple, non-tender, no lymphadenopathy, no thyromegaly, scar on left side of neck from prior endarctectomy Cardiovascular: RRR, 3/6 systolic murmur, +2 DP pulses  Respiratory: CTA bilaterally. No increased work of breathing Gastrointestinal: abdomen soft, non-tender, non-distended, +BS Rectal: external hemorrhoids visible, one appears to have recently bled, normal rectal tone, no rectal masses felt MSK: warm and well perfused, no edema or cyanosis Derm: no rashes or lesions noted over  back, abdomen, extremities Neuro: CN2-12 in tact, 5/5 strength in upper and lower extremities bilaterally, sensation in tact throughout  Psych: appropriate mood and affect   Labs and Imaging: CBC BMET   Recent Labs Lab 09/26/16 1224  WBC 9.8  HGB 10.0*  HCT 30.7*  PLT 272    Recent Labs Lab 09/26/16 1224  NA 140  K 4.2  CL 108  CO2 22  BUN 10  CREATININE 0.69  GLUCOSE 121*  CALCIUM 9.6     No results found.  INR 1.02   Tillman Sers, DO 09/26/2016, 4:52 PM PGY-1, [facility] FPTS Intern pager: [phone], text pages welcome  Upper Level Addendum:  I have seen and evaluated this patient along with Dr. Wonda Olds and reviewed the above note, making necessary revisions in pink.  Erasmo Downer, MD, MPH PGY-3,  [facility] 09/26/2016 5:00 PM

## 2016-09-26 NOTE — ED Notes (Signed)
Attempted report 

## 2016-09-26 NOTE — ED Triage Notes (Signed)
Rectal bleeding on and off for  afew hours started last night and now will not stop , no bloodthinners except asa has hx of aneuryism  With repair

## 2016-09-26 NOTE — Progress Notes (Signed)
New Admission Note: transfer from ED  Arrival Method: Stretcher Mental Orientation: a/o x4 Telemetry: placed Assessment: Completed Skin: clean dry intact IV:RFA SL Pain: none Tubes:none Safety Measures: Safety Fall Prevention Plan has been given, discussed and signed Admission: Completed Unit Orientation: Patient has been orientated to the room, unit and staff.  Family:present upon arrival  Orders have been reviewed and implemented. Will continue to monitor the patient. Call light has been placed within reach and bed alarm has been activated.   Janeann ForehandLuke Yeva Bissette BSN, RN

## 2016-09-26 NOTE — ED Notes (Signed)
Medical student at bedside

## 2016-09-26 NOTE — ED Provider Notes (Signed)
[facility] DEPT Provider Note   CSN: [id number] Arrival date & time: 09/26/16  1116     History   Chief Complaint Chief Complaint  Patient presents with  . GI Problem  . Rectal Bleeding    HPI Margaret Castillo is a 82 y.o. female.HPI    Chief complaint is rectal bleeding.  82 year old female presents here with her son. She started noticing some left from her rectum last night. This morning she had several episodes. She went to urinate. During down she states of "blood "squirted". Describes a fair amount in the bottom of the toilet and had a flushed 3 times to get all the blood from the toilet. She feels weak in the knees and somewhat lightheaded although not syncopal. Has history of anemia. Today her hemoglobin is 10 which is up from her but baseline of 8 or 9 several months ago.  Patient had colonoscopy about 7 or 8 years ago in her home in [place]. She reports this as being normal. Does not recall having any biopsies, masses, polyps, or diverticuli.  She states she does have hemorrhoids and occasionally they bled before "never like this".  Past Medical History:  Diagnosis Date  . AAA (abdominal aortic aneurysm) (HCC)   . Anemia   . Arthritis    Neck  . Carotid artery occlusion   . Hearing aid worn   . Hypothyroidism   . Myocardial infarction 1982  . Polymyalgia rheumatica [facility])     Patient Active Problem List   Diagnosis Date Noted  . AAA (abdominal aortic aneurysm) (HCC) 02/29/2016  . Syncope, vasovagal 02/03/2016  . Pain in limb-Left knee 08/13/2013  . Aftercare following surgery of the circulatory system, NEC 08/13/2013  . Occlusion and stenosis of carotid artery without mention of cerebral infarction 08/09/2012  . Dizziness 08/09/2012    Past Surgical History:  Procedure Laterality Date  . ABDOMINAL AORTIC ANEURYSM REPAIR  2008   Infrarenal aneurysm stent graft repair  . ABDOMINAL AORTIC ENDOVASCULAR STENT GRAFT N/A 02/29/2016   Procedure: Aortic  cuff/Gore;  Surgeon: Larina Earthly, MD;  Location: [facility];  Service: Vascular;  Laterality: N/A;  . APPENDECTOMY    . CAROTID ENDARTERECTOMY  12-07-06   Left CEA  . CARPAL TUNNEL RELEASE Left   . CHOLECYSTECTOMY     Gall Bladder  . EYE SURGERY Bilateral    Cataract  . PERIPHERAL VASCULAR CATHETERIZATION N/A 02/03/2016   Procedure: Abdominal Aortogram;  Surgeon: Pryor Ochoa, MD;  Location[facility] CV LAB;  Service: Cardiovascular;  Laterality: N/A;  . PERIPHERAL VASCULAR CATHETERIZATION Bilateral 02/03/2016   Procedure: Lower Extremity Angiography;  Surgeon: Pryor Ochoa, MD;  Location: [facility] INVASIVE CV LAB;  Service: Cardiovascular;  Laterality: Bilateral;  . TONSILLECTOMY      OB History    No data available       Home Medications    Prior to Admission medications   Medication Sig Start Date End Date Taking? Authorizing Provider  acetaminophen (TYLENOL) 500 MG tablet Take 500-1,000 mg by mouth as needed for mild pain.     Historical Provider, MD  aspirin EC 81 MG tablet Take 81 mg by mouth daily.    Historical Provider, MD  ciprofloxacin (CIPRO) 500 MG tablet Take 1 tablet (500 mg total) by mouth 2 (two) times daily. Patient not taking: Reported on 06/21/2016 01/23/16   Lyndal Pulley, MD  cyanocobalamin (,VITAMIN B-12,) 1000 MCG/ML injection Inject 1,000 mcg into the muscle every 30 (thirty) days.  04/14/15   Historical Provider, MD  ferrous sulfate 325 (65 FE) MG tablet Take 325 mg by mouth daily with breakfast.    Historical Provider, MD  levothyroxine (SYNTHROID) 112 MCG tablet Take 112 mcg by mouth daily. Patient alternates with every other day 06/03/14   Historical Provider, MD  meclizine (ANTIVERT) 25 MG tablet Take 12.5 mg by mouth 2 (two) times daily as needed for dizziness.  03/17/14   Historical Provider, MD  oxyCODONE-acetaminophen (PERCOCET/ROXICET) 5-325 MG tablet Take 1 tablet by mouth every 6 (six) hours as needed for moderate pain. Patient not taking: Reported  on 06/21/2016 03/01/16   Lars Mage, PA-C  predniSONE (DELTASONE) 5 MG tablet Take 5 mg by mouth daily.    Historical Provider, MD  SYNTHROID 100 MCG tablet Take 100 mcg by mouth every other day. Alternating with 112 mcg 01/05/16   Historical Provider, MD  triamcinolone cream (KENALOG) 0.1 % Apply 1 application topically as needed.  02/20/14   Historical Provider, MD  Vitamin D, Ergocalciferol, (DRISDOL) 50000 UNITS CAPS Take 50,000 Units by mouth every Wednesday.     Historical Provider, MD    Family History Family History  Problem Relation Age of Onset  . Heart disease Father     After age 42   . Heart attack Father   . Cancer Sister   . Diabetes Sister   . Cancer Brother   . Cancer Brother   . Heart disease Brother   . Cancer Brother     Prostate  . Diabetes Brother   . Stroke Mother   . Diabetes Mother   . Stroke Sister   . Heart disease Brother   . Arthritis Brother   . Cancer Brother     Prostate  . Heart attack Sister   . Cancer Paternal Grandfather     Social History Social History  Substance Use Topics  . Smoking status: Former Smoker    Quit date: 08/22/1980  . Smokeless tobacco: Never Used  . Alcohol use No     Allergies   Atorvastatin; Codeine; Crestor [rosuvastatin]; Penicillins; Adhesive [tape]; and Cephalexin   Review of Systems Review of Systems  Constitutional: Negative for appetite change, chills, diaphoresis, fatigue and fever.  HENT: Negative for mouth sores, sore throat and trouble swallowing.   Eyes: Negative for visual disturbance.  Respiratory: Negative for cough, chest tightness, shortness of breath and wheezing.   Cardiovascular: Negative for chest pain.  Gastrointestinal: Positive for anal bleeding. Negative for abdominal distention, abdominal pain, diarrhea, nausea and vomiting.  Endocrine: Negative for polydipsia, polyphagia and polyuria.  Genitourinary: Negative for dysuria, frequency and hematuria.  Musculoskeletal: Negative for  gait problem.  Skin: Negative for color change, pallor and rash.  Neurological: Negative for dizziness, syncope, light-headedness and headaches.  Hematological: Does not bruise/bleed easily.  Psychiatric/Behavioral: Negative for behavioral problems and confusion.     Physical Exam Updated Vital Signs BP (!) 84/58 (BP Location: Right Arm)   Pulse 82   Temp 98.5 F (36.9 C) (Oral)   Resp 20   Ht 5\' 6"  (1.676 m)   Wt 187 lb (84.8 kg)   SpO2 97%   BMI 30.18 kg/m   Physical Exam  Constitutional: She is oriented to person, place, and time. She appears well-developed and well-nourished. No distress.  HENT:  Head: Normocephalic.  Eyes: Conjunctivae are normal. Pupils are equal, round, and reactive to light. No scleral icterus.  Conjunctiva are slightly pale.  Neck: Normal range of motion. Neck  supple. No thyromegaly present.  Cardiovascular: Normal rate and regular rhythm.  Exam reveals no gallop and no friction rub.   No murmur heard. Pulmonary/Chest: Effort normal and breath sounds normal. No respiratory distress. She has no wheezes. She has no rales.  Abdominal: Soft. Bowel sounds are normal. She exhibits no distension. There is no tenderness. There is no rebound.  Genitourinary:     Musculoskeletal: Normal range of motion.  Neurological: She is alert and oriented to person, place, and time.  Skin: Skin is warm and dry. No rash noted.  Psychiatric: She has a normal mood and affect. Her behavior is normal.     ED Treatments / Results  Labs (all labs ordered are listed, but only abnormal results are displayed) Labs Reviewed  COMPREHENSIVE METABOLIC PANEL - Abnormal; Notable for the following:       Result Value   Glucose, Bld 121 (*)    ALT 8 (*)    All other components within normal limits  CBC - Abnormal; Notable for the following:    RBC 2.98 (*)    Hemoglobin 10.0 (*)    HCT 30.7 (*)    MCV 103.0 (*)    RDW 15.8 (*)    All other components within normal limits    PROTIME-INR  POC OCCULT BLOOD, ED  TYPE AND SCREEN    EKG  EKG Interpretation None       Radiology No results found.  Procedures Procedures (including critical care time)  Medications Ordered in ED Medications  sodium chloride 0.9 % bolus 1,000 mL (1,000 mLs Intravenous New Bag/Given 09/26/16 1456)     Initial Impression / Assessment and Plan / ED Course  I have reviewed the triage vital signs and the nursing notes.  Pertinent labs & imaging results that were available during my care of the patient were reviewed by me and considered in my medical decision making (see chart for details).     Hemoglobin 10. As a pressure 88. His given fluids. After only 20 mL of fluid his pressure is 95. She is awake alert and mentating well. Painless rectal bleeding. Eventually include malignancy, diverticuli, internal hemorrhoids. Doubt that her external bleeding hemorrhoid is her source. Discussed with the family medicine resident on-call. Patient be admitted. GI consultation. Serial hemoglobins.  Final Clinical Impressions(s) / ED Diagnoses   Final diagnoses:  Rectal bleeding    New Prescriptions New Prescriptions   No medications on file     Rolland PorterMark Bryce Kimble, MD 09/26/16 1500

## 2016-09-27 ENCOUNTER — Encounter (HOSPITAL_COMMUNITY): Payer: Self-pay | Admitting: Family Medicine

## 2016-09-27 DIAGNOSIS — D62 Acute posthemorrhagic anemia: Secondary | ICD-10-CM | POA: Diagnosis not present

## 2016-09-27 DIAGNOSIS — K649 Unspecified hemorrhoids: Secondary | ICD-10-CM | POA: Diagnosis not present

## 2016-09-27 DIAGNOSIS — K5730 Diverticulosis of large intestine without perforation or abscess without bleeding: Secondary | ICD-10-CM | POA: Diagnosis not present

## 2016-09-27 DIAGNOSIS — K625 Hemorrhage of anus and rectum: Secondary | ICD-10-CM | POA: Diagnosis not present

## 2016-09-27 LAB — BASIC METABOLIC PANEL
Anion gap: 8 (ref 5–15)
BUN: 8 mg/dL (ref 6–20)
CO2: 23 mmol/L (ref 22–32)
Calcium: 8.5 mg/dL — ABNORMAL LOW (ref 8.9–10.3)
Chloride: 111 mmol/L (ref 101–111)
Creatinine, Ser: 0.65 mg/dL (ref 0.44–1.00)
GFR calc Af Amer: >60 mL/min (ref >60)
GLUCOSE: 90 mg/dL (ref 65–99)
POTASSIUM: 3.6 mmol/L (ref 3.5–5.1)
Sodium: 142 mmol/L (ref 135–145)

## 2016-09-27 LAB — HEMOGLOBIN AND HEMATOCRIT, BLOOD
HEMATOCRIT: 35.3 % — AB (ref 36.0–46.0)
Hemoglobin: 11.6 g/dL — ABNORMAL LOW (ref 12.0–15.0)

## 2016-09-27 LAB — PREPARE RBC (CROSSMATCH): ORDER CONFIRMATION: POSITIVE

## 2016-09-27 LAB — OCCULT BLOOD, POC DEVICE: FECAL OCCULT BLD: NEGATIVE

## 2016-09-27 LAB — HEMATOCRIT: HCT: 24.6 % — ABNORMAL LOW (ref 36.0–46.0)

## 2016-09-27 LAB — HEMOGLOBIN: HEMOGLOBIN: 8.1 g/dL — AB (ref 12.0–15.0)

## 2016-09-27 MED ORDER — PEG-KCL-NACL-NASULF-NA ASC-C 100 G PO SOLR: 1.0000 | Freq: Once | ORAL | Status: DC

## 2016-09-27 MED ORDER — METOCLOPRAMIDE HCL 5 MG/ML IJ SOLN
10.0000 mg | Freq: Once | INTRAMUSCULAR | Status: AC
Start: 2016-09-27 — End: 2016-09-27
  Administered 2016-09-27: 10 mg via INTRAVENOUS
  Filled 2016-09-27: qty 2

## 2016-09-27 MED ORDER — METOCLOPRAMIDE HCL 5 MG/ML IJ SOLN
10.0000 mg | Freq: Once | INTRAMUSCULAR | Status: AC
  Administered 2016-09-28: 10 mg via INTRAVENOUS
  Filled 2016-09-27: qty 2

## 2016-09-27 MED ORDER — POTASSIUM CHLORIDE 2 MEQ/ML IV SOLN
INTRAVENOUS | Status: DC
  Administered 2016-09-27 – 2016-09-28 (×2): via INTRAVENOUS
  Filled 2016-09-27 (×3): qty 1000

## 2016-09-27 MED ORDER — PEG-KCL-NACL-NASULF-NA ASC-C 100 G PO SOLR
0.5000 | Freq: Once | ORAL | Status: AC
  Administered 2016-09-27: 100 g via ORAL
  Filled 2016-09-27: qty 1

## 2016-09-27 MED ORDER — SODIUM CHLORIDE 0.9 % IV SOLN
Freq: Once | INTRAVENOUS | Status: AC
  Administered 2016-09-27: 08:00:00 via INTRAVENOUS

## 2016-09-27 MED ORDER — PEG-KCL-NACL-NASULF-NA ASC-C 100 G PO SOLR
0.5000 | Freq: Once | ORAL | Status: AC
  Administered 2016-09-28: 100 g via ORAL

## 2016-09-27 MED ORDER — BISACODYL 5 MG PO TBEC
10.0000 mg | DELAYED_RELEASE_TABLET | Freq: Once | ORAL | Status: AC
  Administered 2016-09-27: 10 mg via ORAL
  Filled 2016-09-27: qty 2

## 2016-09-27 NOTE — Progress Notes (Signed)
Family Medicine Teaching Service Daily Progress Note Intern Pager: [phone]  Patient name: Margaret Castillo Medical record number: [id number] Date of birth: Nov 04, 1934 Age: 82 y.o. Gender: female  Primary Care Provider: Lindwood QuaHOFFMAN,BYRON, MD Consultants: Gastroenterology Code Status: Full  Pt Overview and Major Events to Date:  Margaret Castillo is a 82 y.o. female presenting with rectal bleeding. PMH is significant for polymyalgia rheumatica, AAA (repaired), vasovagal syncope, hypothyroidism, anemia, MI (1982).  Rectal bleeding- began evening of 2/4. Baseline hemoglobin appears to be about 10 per chart review, hemoglobin upon presentation to ED is 10. Patient taking daily iron supplements and monthly B12 injections for anemia. Not initially tachycardic although blood pressure low 80's/60's. s/p 1 liter fluid bolus for low BP. External hemorrhoid present on rectal exam with minor bleeding evident however due to the amount the patient is describing of BRB per rectum likely internal source. Differential include lower GI bleed vs malignancy vs hemorrhoidal bleeding. Last colonoscopy > 10 years ago. Patient denies further bleeding but has not been out of bed. Hgb continues to decrease now to 8.1 this a.m. -Inpatient, attending Dr. McDiarmid -monitor on telemetry -vitals per unit -up with assistance -Transfuse 2 U pRBCs as near transfusion threshold given cardiac history of remote MI and AAA repair -GI consulted, appreciate recommendations; plan for colonoscopy 2/7 -if hemorrhoids internal and severely bleeding may need general surgery consult -FOBT collected and negative?  Anemia, multifactorial- Baseline 10. MCV 103. Weekly B12 injections, daily iron -monitor blood count as above -continue daily iron -B12 as outpatient   Hypothroidism- currently taking synthroid 110 qod and 112 qod. Last TSH not documented in chart -continue home synthroid -TSH 0.283, consider recheck  outpatient  Polymyalgia rheumatica- on prednisone 10mg  daily -continue home prednisone -patient should follow up with rheumatology outpatient   CAD- on daily aspirin at home -hold aspirin in setting of bleeding  FEN/GI: NPO pending GI evaluation, maintenance IVF Prophylaxis: SCD's, no pharmalogic VTE due to bleed  Disposition: pending clinical improvement  Subjective:  Margaret Castillo denies abdominal pain and further rectal bleeding. Feels less weak though had not received transfusion yet.   Objective: Temp:  [97.8 F (36.6 C)-99.2 F (37.3 C)] 98.1 F (36.7 C) (02/06 0943) Pulse Rate:  [64-89] 70 (02/06 0943) Resp:  [16-20] 18 (02/06 0943) BP: (82-113)/(53-86) 98/67 (02/06 0943) SpO2:  [97 %-99 %] 98 % (02/06 0943) Weight:  [186 lb 11.2 oz (84.7 kg)-187 lb (84.8 kg)] 186 lb 11.2 oz (84.7 kg) (02/05 2259) Physical Exam: General: Well-appearing elderly female in NAD Cardiovascular: RRR, S1, S2, no m/r/g Respiratory: CTAB, speaking in complete sentences Abdomen: Soft, non-tender, +BS Extremities: Moves all spontaneously, no LE edema  Laboratory:  Recent Labs Lab 09/26/16 1224 09/26/16 1915 09/27/16 0528  WBC 9.8  --   --   HGB 10.0* 9.3* 8.1*  HCT 30.7* 28.9* 24.6*  PLT 272  --   --     Recent Labs Lab 09/26/16 1224 09/27/16 0528  NA 140 142  K 4.2 3.6  CL 108 111  CO2 22 23  BUN 10 8  CREATININE 0.69 0.65  CALCIUM 9.6 8.5*  PROT 6.9  --   BILITOT 0.9  --   ALKPHOS 42  --   ALT 8*  --   AST 20  --   GLUCOSE 121* 90   Imaging/Diagnostic Tests: No results found.  Casey BurkittHillary Moen Fitzgerald, MD 09/27/2016, 10:00 AM PGY-2, [facility] FPTS Intern pager: [phone], text pages welcome

## 2016-09-27 NOTE — Progress Notes (Signed)
Post cleaning noted her external hemorrhoids bleeding . Moist dressing applied to rectum to stop bleeding. Will continue to monitor. Stool watery brown with bright red blood.

## 2016-09-27 NOTE — Progress Notes (Signed)
Family Medicine Teaching Service Medical Student Daily Progress Note For full and approved plan, please see resident's daily note Intern Pager: [phone]  Patient name: Margaret Castillo Medical record number: [id number] Date of birth: [birth year] Age: 82 y.o. Gender: female  Primary Care Provider: Lindwood Qua, MD Consultants: GI Code Status: Full  Pt Overview and Major Events to Date:  - Hgb downtrending 8.1 < 9.3 < 10 in ED (baseline ~10) - 2u pRBCs 09/27/16 (ordered)  Assessment and Plan: Margaret Castillo is a 82 y.o. female presenting with bright red blood per rectum. PMH is significant for polymyalgia rheumatica (on chronic steroids), AAA (repaired 2008, revised 2017), vasovagal syncope, hypothyroidism, anemia, B12 deficiency, MI (1982).  #Rectal bleeding  baseline macrocytic anemia: stable. Hgb AM 2/6 8.1 (9.3 < 10.0 in ED 2/5), with baseline ~10. MCV 103, BUN wnl, PT/INR wnl. Continued normal HR, with hypotension to 88/53. S/p 1L NS bolus in ED and mIVF overnight. Obvious bleeding began evening of 2/4 and stopped early 2/5. Patient taking daily iron supplements and monthly B12 injections for anemia. External hemorrhoid present on rectal exam with minor bleeding evident however due to the amount the patient is describing of BRB per rectum likely internal source. Differential includes lower GI bleed vs malignancy vs hemorrhoidal bleeding. Last colonoscopy >10 years ago. -transfuse 2u pRBCs  -NPO at [place] for planned colonoscopy 2/7 at 0830, per GI recs -Hold daily iron for colonoscopy per GI recs -IVF to D5 1/2 NS with 10 KCL at 50 mL/h per GI recs -Split dose of moviprep per GI recs -place in observation, attending Dr. McDiarmid -monitor on telemetry -vitals per unit -up with assistance -serial H/H, will recheck s/p transfusion -if hemorrhoids internal and severely bleeding may need general surgery consult -transfusion threshold <8 due to hx of CAD -FOBT collected, pending  #Urge  incontinence: stable. Foley placed 2/5 for incontinence during fluid administration. -D/c foley to decrease infxn risk  #Hypothroidism TSH 0.283. 09/26/16. Currently taking synthroid 110 qod and 112 qod. -continue home synthroid -F/u synthroid dose titration as outpt  ##CHRONIC #Polymyalgia rheumatica: continue prednisone 5 mg daily. #CAD:  Hold home ASA in setting of bleeding  FEN/GI: Clears pending GI evaluation Prophylaxis: SCD's, no pharmalogic VTE due to bleed  Disposition: pending clinical improvement   Subjective:  Patient reports no bleeding since 2/5 AM, though has not had a BM since then. Denies pain, dyspnea, palpitations, dizziness. Tolerating food with no n/v this AM.  Objective: Temp:  [98 F (36.7 C)-99.2 F (37.3 C)] 98 F (36.7 C) (02/06 0500) Pulse Rate:  [64-89] 64 (02/06 0500) Resp:  [16-20] 16 (02/06 0500) BP: (82-113)/(53-86) 88/53 (02/06 0500) SpO2:  [97 %-99 %] 99 % (02/06 0500) Weight:  [84.7 kg (186 lb 11.2 oz)-84.8 kg (187 lb)] 84.7 kg (186 lb 11.2 oz) (02/05 2259) Physical Exam: General: Well appearing woman in no acute distress HEENT: conjunctiva pale. Cardiovascular: Normal rate, regular rhythm. 1+ bilateral radial pulses. Respiratory: Lungs CTAB, no w/r/r. Moves air well.  Abdomen: NABS. Nontender, nondistended. Extremities: Pale, warm. Cap refill 1-2 sec.  Laboratory:  Recent Labs Lab 09/26/16 1224 09/26/16 1915 09/27/16 0528  WBC 9.8  --   --   HGB 10.0* 9.3* 8.1*  HCT 30.7* 28.9* 24.6*  PLT 272  --   --     Recent Labs Lab 09/26/16 1224 09/27/16 0528  NA 140 142  K 4.2 3.6  CL 108 111  CO2 22 23  BUN 10 8  CREATININE 0.69 0.65  CALCIUM 9.6 8.5*  PROT 6.9  --   BILITOT 0.9  --   ALKPHOS 42  --   ALT 8*  --   AST 20  --   GLUCOSE 121* 90   -TSH 0.283 -INR 1.02 -POC occult blood test, pending   Biagio Quinthane R Jeanette Moffatt, Medical Student 09/27/2016, 7:27 AM FPTS Intern pager: [phone], text pages welcome

## 2016-09-27 NOTE — Consult Note (Signed)
[facility] Consult: 8:16 AM 09/27/2016  LOS: 1 day    Referring Provider: Dr McDiarmid  Primary Care Physician:  Lindwood QuaHOFFMAN,BYRON, MD [facility] Primary Gastroenterologist:  unassigned     Reason for Consultation:  Bleeding PR   HPI: Margaret Castillo is a 82 y.o. female.  PMH polymyalgia rheumatica on chronic Prednisone 10mg  daily.  Vasovagal syncope. Hypothyroidism. Anemia.  MI (1982).  ASPVD. AAA repair 2008, stent graft repair of infrarenal AA 02/2016.  Carotid stenosis.  Bladder incontinence, pessary in place. S/p appy.  S/p cholecystectomy.  Macrocytic anemia, Hgb 8 to 9 in 01/2016 - 02/2016, she takes monthly B12 injections and daily oral iron, never transfused in past.   Colonoscopy last ~>10 yrs [place].  She does not recall having had polyps.  Colon Diverticulosis, "suspected hemodynamically significant stenoses involving the origin of the celiac and mid aspect of the main trunk of the SMA.  Correlation for symptoms of chronic mesenteric ischemia is recommended on CT of 03/2016".    Patient has occasional issues with bleeding per rectum but this is usually a blood she sees on tissue with wiping or sometimes a small amount of blood associated with stool that can be seen in the commode. She has maybe 3 or 4 episodes every year for the past 8 years.  Am times she can feel that the hemorrhoids have "thrombosed" because there's throbbing rectal pain.  She attributes some of the bleeding to the fact that she has bladder, rectal and uterine prolapse. Starting ~ 11 PM on 2/4 she saw some small amount of blood per rectum. The next morning for about 45 minutes, starting at 8:30 AM she had 3 or 4 episodes of large volume, painless hematochezia. After that she has had no further stools or bleeding.  She never felt dizzy, lightheaded. Denies any abdominal pain. No nausea or vomiting. Generally she moves her bowels every day and they are brown. Her appetite is somewhat decreased but it is not poor, it is been that way for many months and she's lost maybe 5 pounds. She does not use NSAIDs but does take low-dose aspirin daily. She doesn't get heartburn, denies dysphagia, doesn't take PPI or H2 blocker.  She's never had rectal bleeding/hematochezia on this scale.  Patient recalls that during the colonoscopy prep many years ago, she had significant issues with nausea and vomiting of the prep. She has a brother, who is now in his 2890s, who may have had colon cancer but it's been a long time ago and she is not 100% certain that this is the case. Patient doesn't consume alcoholic beverages. She stopped smoking in the 1980s when she had a heart attack.  The patient contacted one of her sons and [facility]. She was then admitted. Hgbs 10 .Marland Kitchen. 9.3 .. 8.1.  MCV 103.  BUN not elevated. Coags normal.    Blood pressures a bit soft with systolics in the low to mid 80sand diastolics in the 50s, no tachycardia.  Past Medical History:  Diagnosis Date  . AAA (abdominal aortic aneurysm) (HCC)   . Anemia   . Arthritis    Neck  . Carotid artery occlusion   . Endoleak post endovascular aneurysm repair [facility]), Type I 02/2016   [facility] for aortic cuff placement  . Hearing aid worn   . Hypothyroidism   . Myocardial infarction 1982  . Polymyalgia rheumatica (HCC)     Past Surgical History:  Procedure Laterality Date  . ABDOMINAL AORTIC ANEURYSM REPAIR  2008   Infrarenal aneurysm stent graft repair  . ABDOMINAL AORTIC ENDOVASCULAR STENT GRAFT N/A 02/29/2016   Procedure: Aortic cuff/Gore;  Surgeon: Larina Earthly, MD;  Location: [facility] OR;  Service: Vascular;  Laterality: N/A;  . APPENDECTOMY    . CAROTID ENDARTERECTOMY  12-07-06   Left CEA  . CARPAL TUNNEL RELEASE  Left   . CHOLECYSTECTOMY     Gall Bladder  . EYE SURGERY Bilateral    Cataract  . PERIPHERAL VASCULAR CATHETERIZATION N/A 02/03/2016   Procedure: Abdominal Aortogram;  Surgeon: Pryor Ochoa, MD;  Location: [facility] INVASIVE CV LAB;  Service: Cardiovascular;  Laterality: N/A;  . PERIPHERAL VASCULAR CATHETERIZATION Bilateral 02/03/2016   Procedure: Lower Extremity Angiography;  Surgeon: Pryor Ochoa, MD;  Location: [facility] INVASIVE CV LAB;  Service: Cardiovascular;  Laterality: Bilateral;  . TONSILLECTOMY      Prior to Admission medications   Medication Sig Start Date End Date Taking? Authorizing Provider  acetaminophen (TYLENOL) 500 MG tablet Take 500-1,000 mg by mouth as needed for mild pain.    Yes Historical Provider, MD  aspirin EC 81 MG tablet Take 81 mg by mouth daily.   Yes Historical Provider, MD  cyanocobalamin (,VITAMIN B-12,) 1000 MCG/ML injection Inject 1,000 mcg into the muscle every 30 (thirty) days.  04/14/15  Yes Historical Provider, MD  ferrous sulfate 325 (65 FE) MG tablet Take 325 mg by mouth daily with breakfast.   Yes Historical Provider, MD  levothyroxine (SYNTHROID) 112 MCG tablet Take 112 mcg by mouth daily. Takes on odd days, takes on even days 06/03/14  Yes Historical Provider, MD  levothyroxine (SYNTHROID, LEVOTHROID) 100 MCG tablet Take 100 mcg by mouth every other day. Takes on odd days, takes on even days   Yes Historical Provider, MD  meclizine (ANTIVERT) 25 MG tablet Take 12.5 mg by mouth 2 (two) times daily as needed for dizziness.  03/17/14  Yes Historical Provider, MD  predniSONE (DELTASONE) 5 MG tablet Take 5 mg by mouth daily.   Yes Historical Provider, MD  triamcinolone cream (KENALOG) 0.1 % Apply 1 application topically as needed.  02/20/14  Yes Historical Provider, MD  Vitamin D, Ergocalciferol, (DRISDOL) 50000 UNITS CAPS Take 50,000 Units by mouth every 14 (fourteen) days.    Yes Historical Provider, MD    Scheduled Meds: . ferrous  sulfate  325 mg Oral Q breakfast  . [START ON 09/28/2016] levothyroxine  100 mcg Oral QODAY  . levothyroxine  112 mcg Oral QODAY  . predniSONE  5 mg Oral Q breakfast  . sodium chloride flush  3 mL Intravenous Q12H   Infusions: . sodium chloride 100 mL/hr at 09/27/16 0306   PRN Meds: acetaminophen **OR** acetaminophen, polyethylene glycol   Allergies as of 09/26/2016 - Review Complete 09/26/2016  Allergen Reaction Noted  . Atorvastatin Other (See Comments) 01/20/2016  . Codeine Nausea Only 08/03/2012  . Crestor [rosuvastatin] Other (See Comments) 08/13/2013  . Penicillins Rash 08/03/2012  .  Adhesive [tape] Other (See Comments) 02/25/2016  . Cephalexin Rash 01/20/2016    Family History  Problem Relation Age of Onset  . Heart disease Father     After age 54   . Heart attack Father   . Cancer Sister   . Diabetes Sister   . Cancer Brother   . Cancer Brother   . Heart disease Brother   . Cancer Brother     Prostate  . Diabetes Brother   . Stroke Mother   . Diabetes Mother   . Stroke Sister   . Heart disease Brother   . Arthritis Brother   . Cancer Brother     Prostate  . Heart attack Sister   . Cancer Paternal Grandfather     Social History   Social History  . Marital status: Widowed    Spouse name: N/A  . Number of children: N/A  . Years of education: N/A   Occupational History  . Not on file.   Social History Main Topics  . Smoking status: Former Smoker    Quit date: 08/22/1980  . Smokeless tobacco: Never Used  . Alcohol use No  . Drug use: No  . Sexual activity: Not on file   Other Topics Concern  . Not on file   Social History Narrative  . No narrative on file    REVIEW OF SYSTEMS: Constitutional:  Patient generally doesn't suffer from fatigue or weakness. She still mostly does her own housework. 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 ENT:  No nose bleeds Pulm:  No shortness of breath, no cough. CV:  No palpitations, no LE edema.  Chest pain GU:  Bladder prolapse and bladder incontinence. GI:  Per HPI Heme:  Other then this current acute GI bleeding and some more chronic low-level bleeding per rectum, patient does not have any inappropriate bleeding or excessive bruising   Transfusions:  She has never been transfused with blood products Neuro:  No headaches, no peripheral tingling or numbness Derm:  No itching, no rash or sores.  Endocrine:  No sweats or chills.  No polyuria or dysuria Immunization:  Did not inquire as to recent immunizations. Travel:  None beyond local counties in last few months.    PHYSICAL EXAM: Vital signs in last 24 hours: Vitals:   09/26/16 2259 09/27/16 0500  BP: 102/61 (!) 88/53  Pulse: 73 64  Resp: 16 16  Temp: 98.2 F (36.8 C) 98 F (36.7 C)   Wt Readings from Last 3 Encounters:  09/26/16 84.7 kg (186 lb 11.2 oz)  06/21/16 83.5 kg (184 lb)  04/05/16 84.5 kg (186 lb 4.8 oz)    General: Pleasant elderly white female who appears comfortable and in good health Head:  No facial asymmetry or swelling.  Eyes:  No scleral icterus, no conjunctival pallor. EOMI. Ears:  Slightly HOH.  Nose:  No congestion or discharge Mouth:  Clear, moist oral mucosa. Many missing teeth, the bulk of her molars are gone. Remaining teeth look healthy. Tongue midline Neck:  No TMG, masses, JVD. Lungs:  Excellent breath sounds. Clear bilaterally. No cough, no labored breathing. Heart: RRR. No MRG. S1, S2 present. Abdomen:  Soft.NT, ND.  No HSM, masses, bruits..   Rectal: large external hemorrhoids, no visible bleeding.  No blood or stool on exam glove.   Musc/Skeltl: No joint erythema or gross deformities. Some arthritic changes in the knees and fingers. Extremities:  No CCE.  Neurologic:  Patient is alert, oriented times 3.  Moves all 4 limbs, strength not tested. Slight essential tremor in the upper extremities. Skin:  Prominent venules in the skin on the thighs. Tattoos:  None Nodes:  No cervical or  inguinal adenopathy.   Psych:  Pleasant, calm. Cooperative. Not anxious or agitated.  LAB RESULTS:  Recent Labs  09/26/16 1224 09/26/16 1915 09/27/16 0528  WBC 9.8  --   --   HGB 10.0* 9.3* 8.1*  HCT 30.7* 28.9* 24.6*  PLT 272  --   --    BMET Lab Results  Component Value Date   NA 142 09/27/2016   NA 140 09/26/2016   NA 136 03/01/2016   K 3.6 09/27/2016   K 4.2 09/26/2016   K 4.0 03/01/2016   CL 111 09/27/2016   CL 108 09/26/2016   CL 108 03/01/2016   CO2 23 09/27/2016   CO2 22 09/26/2016   CO2 21 (L) 03/01/2016   GLUCOSE 90 09/27/2016   GLUCOSE 121 (H) 09/26/2016   GLUCOSE 87 03/01/2016   BUN 8 09/27/2016   BUN 10 09/26/2016   BUN 5 (L) 03/01/2016   CREATININE 0.65 09/27/2016   CREATININE 0.69 09/26/2016   CREATININE 0.63 03/01/2016   CALCIUM 8.5 (L) 09/27/2016   CALCIUM 9.6 09/26/2016   CALCIUM 8.1 (L) 03/01/2016   LFT  Recent Labs  09/26/16 1224  PROT 6.9  ALBUMIN 3.8  AST 20  ALT 8*  ALKPHOS 42  BILITOT 0.9   PT/INR Lab Results  Component Value Date   INR 1.02 09/26/2016   INR 1.26 02/29/2016   INR 1.09 02/25/2016     IMPRESSION:   *  Painless hematochezia. Rule out diverticular bleed. Amount of bleeding seems excessive for hemorrhoidal bleeding but this is a possible cause. 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 S/P 2008 AAA surgical repair and 02/2016 stent graft repair of infrarenal AAA, so sentinel bleeding from aorta to enteric fistula is also in the ddx.    *  PMR, on chronic Prednisone 10 mg daily.    *  Chronic anemia with superimposed blood loss anemia.  Note orders to transfuse with PRBC x 2.   PLAN:     *  Colonoscopy 2/7 at 0830 AM.  Stop oral iron to allow for optimal bowel prep.  Split dose moviprep.  D/w pt and her son, she is agreeable to proceed.   *  Not sure pt needs 2 PRBCs but defer transfusion  decisions to attending MD.    *  Switched the IVF to D5 1/2 nml saline with 10 KCL at 50 ml/hour. This because her potassium level is borderline low normal and do not want hypokalemia on labs in AM as this would be grounds for anaesthesia postponing colonoscopy.    Jennye Moccasin  09/27/2016, 8:16 AM Pager: [phone]  ________________________________________________________________________  [facility] MD note:  I personally examined the patient, reviewed the data and agree with the assessment and plan described above.  Painless rectal bleeding in setting of large hemorrhoids. Has known PVD but this does not seem likely bleeding due to ischemia of the GI tract.  More likely hemorrhoidal, diverticular, AVM, other.  She is getting one unit blood transfusion this morning and we are preparing for colonoscopy tomorrow.    Rob Bunting, MD [facility] Pager [phone]

## 2016-09-28 ENCOUNTER — Inpatient Hospital Stay (HOSPITAL_COMMUNITY): Payer: MEDICARE | Admitting: Certified Registered"

## 2016-09-28 ENCOUNTER — Encounter (HOSPITAL_COMMUNITY)
Admission: EM | Disposition: A | Discharge status: Home / Self Care | Payer: Self-pay | Source: Home / Self Care | Attending: Family Medicine

## 2016-09-28 ENCOUNTER — Encounter (HOSPITAL_COMMUNITY): Payer: Self-pay | Admitting: Certified Registered"

## 2016-09-28 DIAGNOSIS — M353 Polymyalgia rheumatica: Secondary | ICD-10-CM | POA: Diagnosis not present

## 2016-09-28 DIAGNOSIS — K5730 Diverticulosis of large intestine without perforation or abscess without bleeding: Secondary | ICD-10-CM | POA: Diagnosis not present

## 2016-09-28 DIAGNOSIS — D62 Acute posthemorrhagic anemia: Secondary | ICD-10-CM | POA: Diagnosis not present

## 2016-09-28 DIAGNOSIS — I252 Old myocardial infarction: Secondary | ICD-10-CM | POA: Diagnosis not present

## 2016-09-28 DIAGNOSIS — K644 Residual hemorrhoidal skin tags: Secondary | ICD-10-CM | POA: Diagnosis not present

## 2016-09-28 DIAGNOSIS — I959 Hypotension, unspecified: Secondary | ICD-10-CM | POA: Diagnosis not present

## 2016-09-28 DIAGNOSIS — K625 Hemorrhage of anus and rectum: Secondary | ICD-10-CM | POA: Diagnosis not present

## 2016-09-28 DIAGNOSIS — E876 Hypokalemia: Secondary | ICD-10-CM | POA: Diagnosis not present

## 2016-09-28 DIAGNOSIS — K921 Melena: Secondary | ICD-10-CM | POA: Diagnosis not present

## 2016-09-28 DIAGNOSIS — I2510 Atherosclerotic heart disease of native coronary artery without angina pectoris: Secondary | ICD-10-CM | POA: Diagnosis not present

## 2016-09-28 DIAGNOSIS — D500 Iron deficiency anemia secondary to blood loss (chronic): Secondary | ICD-10-CM | POA: Diagnosis not present

## 2016-09-28 DIAGNOSIS — K649 Unspecified hemorrhoids: Secondary | ICD-10-CM | POA: Diagnosis not present

## 2016-09-28 DIAGNOSIS — K648 Other hemorrhoids: Secondary | ICD-10-CM | POA: Diagnosis not present

## 2016-09-28 DIAGNOSIS — K642 Third degree hemorrhoids: Secondary | ICD-10-CM

## 2016-09-28 HISTORY — PX: COLONOSCOPY WITH PROPOFOL: SHX5780

## 2016-09-28 LAB — TYPE AND SCREEN
BLOOD PRODUCT EXPIRATION DATE: 201802062359
Blood Product Expiration Date: 201802222359
ISSUE DATE / TIME: 201802060919
ISSUE DATE / TIME: 201802061228
UNIT TYPE AND RH: 6200
Unit Type and Rh: 6200

## 2016-09-28 LAB — BASIC METABOLIC PANEL
Anion gap: 9 (ref 5–15)
BUN: 6 mg/dL (ref 6–20)
CHLORIDE: 110 mmol/L (ref 101–111)
CO2: 21 mmol/L — ABNORMAL LOW (ref 22–32)
Calcium: 8.7 mg/dL — ABNORMAL LOW (ref 8.9–10.3)
Creatinine, Ser: 0.59 mg/dL (ref 0.44–1.00)
GFR calc Af Amer: >60 mL/min (ref >60)
GFR calc non Af Amer: >60 mL/min (ref >60)
Glucose, Bld: 112 mg/dL — ABNORMAL HIGH (ref 65–99)
POTASSIUM: 3.4 mmol/L — AB (ref 3.5–5.1)
SODIUM: 140 mmol/L (ref 135–145)

## 2016-09-28 LAB — HEMOGLOBIN AND HEMATOCRIT, BLOOD
HCT: 32.2 % — ABNORMAL LOW (ref 36.0–46.0)
Hemoglobin: 10.7 g/dL — ABNORMAL LOW (ref 12.0–15.0)

## 2016-09-28 LAB — CBC
HEMATOCRIT: 33.7 % — AB (ref 36.0–46.0)
Hemoglobin: 11.1 g/dL — ABNORMAL LOW (ref 12.0–15.0)
MCH: 32.3 pg (ref 26.0–34.0)
MCHC: 32.9 g/dL (ref 30.0–36.0)
MCV: 98 fL (ref 78.0–100.0)
Platelets: 220 10*3/uL (ref 150–400)
RBC: 3.44 MIL/uL — ABNORMAL LOW (ref 3.87–5.11)
RDW: 20.6 % — ABNORMAL HIGH (ref 11.5–15.5)
WBC: 6.6 10*3/uL (ref 4.0–10.5)

## 2016-09-28 SURGERY — COLONOSCOPY WITH PROPOFOL
Anesthesia: Monitor Anesthesia Care

## 2016-09-28 MED ORDER — LACTATED RINGERS IV SOLN
INTRAVENOUS | Status: DC | PRN
  Administered 2016-09-28: 08:00:00 via INTRAVENOUS

## 2016-09-28 MED ORDER — PANTOPRAZOLE SODIUM 40 MG PO TBEC
40.0000 mg | DELAYED_RELEASE_TABLET | Freq: Every day | ORAL | Status: DC
  Administered 2016-09-29: 40 mg via ORAL
  Filled 2016-09-28: qty 1

## 2016-09-28 MED ORDER — SODIUM CHLORIDE 0.9 % IV SOLN: INTRAVENOUS | Status: DC

## 2016-09-28 MED ORDER — HYDROCORTISONE 2.5 % RE CREA
TOPICAL_CREAM | Freq: Two times a day (BID) | RECTAL | Status: DC
  Administered 2016-09-28 – 2016-09-29 (×3): via RECTAL
  Filled 2016-09-28 (×2): qty 28.35

## 2016-09-28 MED ORDER — PROPOFOL 500 MG/50ML IV EMUL
INTRAVENOUS | Status: DC | PRN
  Administered 2016-09-28: 75 ug/kg/min via INTRAVENOUS

## 2016-09-28 MED ORDER — PROPOFOL 10 MG/ML IV BOLUS
INTRAVENOUS | Status: DC | PRN
  Administered 2016-09-28: 10 mg via INTRAVENOUS
  Administered 2016-09-28 (×2): 20 mg via INTRAVENOUS

## 2016-09-28 MED ORDER — PSYLLIUM 95 % PO PACK
1.0000 | PACK | Freq: Every day | ORAL | Status: DC
  Administered 2016-09-28 – 2016-09-29 (×2): 1 via ORAL
  Filled 2016-09-28 (×2): qty 1

## 2016-09-28 NOTE — Progress Notes (Signed)
Family Medicine Teaching Service MEDICAL STUDENT Daily Progress Note For full and approved plan, please see resident's daily note Intern Pager: [phone]  Patient name: Margaret Castillo Medical record number: [id number] Date of birth: 1934-11-20 Age: 82 y.o. Gender: female  Primary Care Provider: Lindwood Qua, MD Consultants: GI Code Status: Full  Pt Overview and Major Events to Date:  -Hgb 11.6 < 2u pRBC < 8.1 < 9.3 < 10 in ED (basline ~10) -Colonoscopy 09/28/2016  Assessment and Plan: Kenae Lindquist Wilsonis a 82 y.o.femalepresenting with bright red blood per rectum found to have diverticulosis, as well as external and internal hemorroids. PMH is significant for polymyalgia rheumatica (on chronic steroids), AAA (repaired 2008, revised 2017), vasovagal syncope, hypothyroidism, anemia, B12 deficiency, MI (1982).  #Rectal bleeding  macrocytic anemia: improved. Differential includes diverticular bleed vs hemorrhoidal bleeding. Pt reports intermittent bleeding overnight, with no pain or dyspnea. Hgb AM 2/7 11.6 s/p 2u pRBCs on 2/6 (< 8.1 < 9.3 < 10.0 in ED 2/5), with baseline ~10. MCV 103, BUN wnl, PT/INR wnl. Normotensive overnight, with normal HR throughout admission. Colonoscopy 09/28/16 showed diverticulosis of the left colon, as well as medium sized external and internal hemorrhoids. No old or new blood was noted in the colon. S/p 1L NS bolus in ED. Obvious bleeding began evening of 2/4 and stopped early 2/5. Patient taking daily iron supplements and monthly B12 injections for anemia. External hemorrhoid present on rectal exam in ED with minor bleeding evident and negative FOBT, however due to the amount the patient is describing of BRB per rectum likely internal source. Last colonoscopy >10 years ago. -From GI perspective, okay to discharge 2/8 if no further significant bleeding -H/H tonight and tomorrow morning -Topical hemorrhoidal care BID x1 week, then PRN per GI recs -OTC fiber supplement,  per GI recs -Daily iron -d/c IVF -Telemetry -vitals per unit -up with assistance -transfusion threshold <8 due to hx of CAD  ##CHRONIC #Polymyalgia rheumatica: continue prednisone 5 mg daily. #CAD:  Hold home ASA in setting of bleeding. #Urge incontinence: Foley placed 2/5 for incontinence during fluid administration, d/c'd 2/6. #Hypothroidism: TSH 0.283 09/26/16. Continue home synthroid 110 qod and 112 qod. Titrate as outpatient.  FEN/GI: Regular Prophylaxis: SCD's, no pharmalogic VTE due to bleed  Disposition:pending clinical improvement   Subjective:  NPO for GI procedure overnight. Reports intermittent bleeding overnight, independent of BM. Reports getting out of bed and noticing a "pool of blood." Nursing notes that it was external hemorrhoid bleeding, and applied a bandage. Denies pain, dyspnea. Reports she is ready for colonoscopy.  Objective: Temp:  [97.5 F (36.4 C)-98.9 F (37.2 C)] 98.9 F (37.2 C) (02/07 0652) Pulse Rate:  [64-74] 67 (02/07 0652) Resp:  [16-18] 16 (02/07 0652) BP: (93-109)/(58-71) 93/67 (02/07 0652) SpO2:  [96 %-99 %] 96 % (02/07 0652) Weight:  [82.8 kg (182 lb 9.6 oz)] 82.8 kg (182 lb 9.6 oz) (02/07 0017) Physical Exam: General: Well appearing woman, sitting up in bed, awake and alert, NAD. Cardiovascular: Normal rate, regular rhythm. No m/r/g.  Respiratory: Lungs CTAB. Abdomen: Nontender, nondistended, soft. Extremities: Radial pulse 2+ bilaterally. Cap refill <2 sec.  Laboratory:  Recent Labs Lab 09/26/16 1224 09/26/16 1915 09/27/16 0528 09/27/16 1647  WBC 9.8  --   --   --   HGB 10.0* 9.3* 8.1* 11.6*  HCT 30.7* 28.9* 24.6* 35.3*  PLT 272  --   --   --     Recent Labs Lab 09/26/16 1224 09/27/16 0528  NA 140 142  K 4.2 3.6  CL 108 111  CO2 22 23  BUN 10 8  CREATININE 0.69 0.65  CALCIUM 9.6 8.5*  PROT 6.9  --   BILITOT 0.9  --   ALKPHOS 42  --   ALT 8*  --   AST 20  --   GLUCOSE 121* 90   - FOBT, negative  (obtained 2/5, resulted 2/6)  Imaging/Diagnostic Tests: No new  Biagio Quinthane R Zilda No, Medical Student 09/28/2016, 7:28 AM FPTS Intern pager: [phone], text pages welcome

## 2016-09-28 NOTE — Anesthesia Preprocedure Evaluation (Addendum)
Anesthesia Evaluation  Patient identified by MRN, date of birth, ID band Patient awake    Reviewed: Allergy & Precautions, NPO status , Patient's Chart, lab work & pertinent test results  Airway Mallampati: II  TM Distance: >3 FB Neck ROM: Full    Dental  (+) Teeth Intact, Dental Advisory Given   Pulmonary former smoker,    breath sounds clear to auscultation       Cardiovascular + Past MI and + Peripheral Vascular Disease   Rhythm:Regular Rate:Normal     Neuro/Psych    GI/Hepatic GI bleed   Endo/Other  Hypothyroidism   Renal/GU      Musculoskeletal  (+) Arthritis ,   Abdominal   Peds  Hematology  (+) anemia ,   Anesthesia Other Findings   Reproductive/Obstetrics                            Anesthesia Physical Anesthesia Plan  ASA: III  Anesthesia Plan: MAC   Post-op Pain Management:    Induction: Intravenous  Airway Management Planned: Natural Airway and Nasal Cannula  Additional Equipment:   Intra-op Plan:   Post-operative Plan:   Informed Consent: I have reviewed the patients History and Physical, chart, labs and discussed the procedure including the risks, benefits and alternatives for the proposed anesthesia with the patient or authorized representative who has indicated his/her understanding and acceptance.   Dental advisory given  Plan Discussed with:   Anesthesia Plan Comments:        Anesthesia Quick Evaluation

## 2016-09-28 NOTE — H&P (View-Only) (Signed)
[facility] Consult: 8:16 AM 09/27/2016  LOS: 1 day    Referring Provider: Dr McDiarmid  Primary Care Physician:  Margaret QuaHOFFMAN,BYRON, MD [facility] Primary Gastroenterologist:  unassigned     Reason for Consultation:  Bleeding PR   HPI: [name] is a 82 y.o. female.  PMH polymyalgia rheumatica on chronic Prednisone 10mg  daily.  Vasovagal syncope. Hypothyroidism. Anemia.  MI (1982).  ASPVD. AAA repair 2008, stent graft repair of infrarenal AA 02/2016.  Carotid stenosis.  Bladder incontinence, pessary in place. S/p appy.  S/p cholecystectomy.  Macrocytic anemia, Hgb 8 to 9 in 01/2016 - 02/2016, she takes monthly B12 injections and daily oral iron, never transfused in past.   Colonoscopy last ~[place] yrs [place].  She does not recall having had polyps.  Colon Diverticulosis, "suspected hemodynamically significant stenoses involving the origin of the celiac and mid aspect of the main trunk of the SMA.  Correlation for symptoms of chronic mesenteric ischemia is recommended on CT of 03/2016".    Patient has occasional issues with bleeding per rectum but this is usually a blood she sees on tissue with wiping or sometimes a small amount of blood associated with stool that can be seen in the commode. She has maybe 3 or 4 episodes every year for the past 8 years.  Am times she can feel that the hemorrhoids have "thrombosed" because there's throbbing rectal pain.  She attributes some of the bleeding to the fact that she has bladder, rectal and uterine prolapse. Starting ~ 11 PM on 2/4 she saw some small amount of blood per rectum. The next morning for about 45 minutes, starting at 8:30 AM she had 3 or 4 episodes of large volume, painless hematochezia. After that she has had no further stools or bleeding.  She never felt dizzy, lightheaded. Denies any abdominal pain. No nausea or vomiting. Generally she moves her bowels every day and they are brown. Her appetite is somewhat decreased but it is not poor, it is been that way for many months and she's lost maybe 5 pounds. She does not use NSAIDs but does take low-dose aspirin daily. She doesn't get heartburn, denies dysphagia, doesn't take PPI or H2 blocker.  She's never had rectal bleeding/hematochezia on this scale.  Patient recalls that during the colonoscopy prep many years ago, she had significant issues with nausea and vomiting of the prep. She has a brother, who is now in his 2890s, who may have had colon cancer but it's been a long time ago and she is not 100% certain that this is the case. Patient doesn't consume alcoholic beverages. She stopped smoking in the 1980s when she had a heart attack.  The patient contacted one of her sons and they [facility] yesterday. She was then admitted. Hgbs 10 .Marland Kitchen. 9.3 .. 8.1.  MCV 103.  BUN not elevated. Coags normal.    Blood pressures a bit soft with systolics in the low to mid 80sand diastolics in the 50s, no tachycardia.  Past Medical History:  Diagnosis Date  . AAA (abdominal aortic aneurysm) (HCC)   . Anemia   . Arthritis    Neck  . Carotid artery occlusion   . Endoleak post endovascular aneurysm repair [facility]), Type I 02/2016   [facility] for aortic cuff placement  . Hearing aid worn   . Hypothyroidism   . Myocardial infarction 1982  . Polymyalgia rheumatica (HCC)     Past Surgical History:  Procedure Laterality Date  . ABDOMINAL AORTIC ANEURYSM REPAIR  2008   Infrarenal aneurysm stent graft repair  . ABDOMINAL AORTIC ENDOVASCULAR STENT GRAFT N/A 02/29/2016   Procedure: Aortic cuff/Gore;  Surgeon: Larina Earthly, MD;  Location: [facility] OR;  Service: Vascular;  Laterality: N/A;  . APPENDECTOMY    . CAROTID ENDARTERECTOMY  12-07-06   Left CEA  . CARPAL TUNNEL RELEASE  Left   . CHOLECYSTECTOMY     Gall Bladder  . EYE SURGERY Bilateral    Cataract  . PERIPHERAL VASCULAR CATHETERIZATION N/A 02/03/2016   Procedure: Abdominal Aortogram;  Surgeon: Pryor Ochoa, MD;  Location: [facility] INVASIVE CV LAB;  Service: Cardiovascular;  Laterality: N/A;  . PERIPHERAL VASCULAR CATHETERIZATION Bilateral 02/03/2016   Procedure: Lower Extremity Angiography;  Surgeon: Pryor Ochoa, MD;  Location: [facility] INVASIVE CV LAB;  Service: Cardiovascular;  Laterality: Bilateral;  . TONSILLECTOMY      Prior to Admission medications   Medication Sig Start Date End Date Taking? Authorizing Provider  acetaminophen (TYLENOL) 500 MG tablet Take 500-1,000 mg by mouth as needed for mild pain.    Yes Historical Provider, MD  aspirin EC 81 MG tablet Take 81 mg by mouth daily.   Yes Historical Provider, MD  cyanocobalamin (,VITAMIN B-12,) 1000 MCG/ML injection Inject 1,000 mcg into the muscle every 30 (thirty) days.  04/14/15  Yes Historical Provider, MD  ferrous sulfate 325 (65 FE) MG tablet Take 325 mg by mouth daily with breakfast.   Yes Historical Provider, MD  levothyroxine (SYNTHROID) 112 MCG tablet Take 112 mcg by mouth daily. Takes on odd days, takes on even days 06/03/14  Yes Historical Provider, MD  levothyroxine (SYNTHROID, LEVOTHROID) 100 MCG tablet Take 100 mcg by mouth every other day. Takes on odd days, takes on even days   Yes Historical Provider, MD  meclizine (ANTIVERT) 25 MG tablet Take 12.5 mg by mouth 2 (two) times daily as needed for dizziness.  03/17/14  Yes Historical Provider, MD  predniSONE (DELTASONE) 5 MG tablet Take 5 mg by mouth daily.   Yes Historical Provider, MD  triamcinolone cream (KENALOG) 0.1 % Apply 1 application topically as needed.  02/20/14  Yes Historical Provider, MD  Vitamin D, Ergocalciferol, (DRISDOL) 50000 UNITS CAPS Take 50,000 Units by mouth every 14 (fourteen) days.    Yes Historical Provider, MD    Scheduled Meds: . ferrous  sulfate  325 mg Oral Q breakfast  . [START ON 09/28/2016] levothyroxine  100 mcg Oral QODAY  . levothyroxine  112 mcg Oral QODAY  . predniSONE  5 mg Oral Q breakfast  . sodium chloride flush  3 mL Intravenous Q12H   Infusions: . sodium chloride 100 mL/hr at 09/27/16 0306   PRN Meds: acetaminophen **OR** acetaminophen, polyethylene glycol   Allergies as of 09/26/2016 - Review Complete 09/26/2016  Allergen Reaction Noted  . Atorvastatin Other (See Comments) 01/20/2016  . Codeine Nausea Only 08/03/2012  . Crestor [rosuvastatin] Other (See Comments) 08/13/2013  . Penicillins Rash 08/03/2012  .  Adhesive [tape] Other (See Comments) 02/25/2016  . Cephalexin Rash 01/20/2016    Family History  Problem Relation Age of Onset  . Heart disease Father     After age 54   . Heart attack Father   . Cancer Sister   . Diabetes Sister   . Cancer Brother   . Cancer Brother   . Heart disease Brother   . Cancer Brother     Prostate  . Diabetes Brother   . Stroke Mother   . Diabetes Mother   . Stroke Sister   . Heart disease Brother   . Arthritis Brother   . Cancer Brother     Prostate  . Heart attack Sister   . Cancer Paternal Grandfather     Social History   Social History  . Marital status: Widowed    Spouse name: N/A  . Number of children: N/A  . Years of education: N/A   Occupational History  . Not on file.   Social History Main Topics  . Smoking status: Former Smoker    Quit date: 08/22/1980  . Smokeless tobacco: Never Used  . Alcohol use No  . Drug use: No  . Sexual activity: Not on file   Other Topics Concern  . Not on file   Social History Narrative  . No narrative on file    REVIEW OF SYSTEMS: Constitutional:  Patient generally doesn't suffer from fatigue or weakness. She still mostly does her own housework. 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 ENT:  No nose bleeds Pulm:  No shortness of breath, no cough. CV:  No palpitations, no LE edema.  Chest pain GU:  Bladder prolapse and bladder incontinence. GI:  Per HPI Heme:  Other then this current acute GI bleeding and some more chronic low-level bleeding per rectum, patient does not have any inappropriate bleeding or excessive bruising   Transfusions:  She has never been transfused with blood products Neuro:  No headaches, no peripheral tingling or numbness Derm:  No itching, no rash or sores.  Endocrine:  No sweats or chills.  No polyuria or dysuria Immunization:  Did not inquire as to recent immunizations. Travel:  None beyond local counties in last few months.    PHYSICAL EXAM: Vital signs in last 24 hours: Vitals:   09/26/16 2259 09/27/16 0500  BP: 102/61 (!) 88/53  Pulse: 73 64  Resp: 16 16  Temp: 98.2 F (36.8 C) 98 F (36.7 C)   Wt Readings from Last 3 Encounters:  09/26/16 84.7 kg (186 lb 11.2 oz)  06/21/16 83.5 kg (184 lb)  04/05/16 84.5 kg (186 lb 4.8 oz)    General: Pleasant elderly white female who appears comfortable and in good health Head:  No facial asymmetry or swelling.  Eyes:  No scleral icterus, no conjunctival pallor. EOMI. Ears:  Slightly HOH.  Nose:  No congestion or discharge Mouth:  Clear, moist oral mucosa. Many missing teeth, the bulk of her molars are gone. Remaining teeth look healthy. Tongue midline Neck:  No TMG, masses, JVD. Lungs:  Excellent breath sounds. Clear bilaterally. No cough, no labored breathing. Heart: RRR. No MRG. S1, S2 present. Abdomen:  Soft.NT, ND.  No HSM, masses, bruits..   Rectal: large external hemorrhoids, no visible bleeding.  No blood or stool on exam glove.   Musc/Skeltl: No joint erythema or gross deformities. Some arthritic changes in the knees and fingers. Extremities:  No CCE.  Neurologic:  Patient is alert, oriented times 3.  Moves all 4 limbs, strength not tested. Slight essential tremor in the upper extremities. Skin:  Prominent venules in the skin on the thighs. Tattoos:  None Nodes:  No cervical or  inguinal adenopathy.   Psych:  Pleasant, calm. Cooperative. Not anxious or agitated.  LAB RESULTS:  Recent Labs  09/26/16 1224 09/26/16 1915 09/27/16 0528  WBC 9.8  --   --   HGB 10.0* 9.3* 8.1*  HCT 30.7* 28.9* 24.6*  PLT 272  --   --    BMET Lab Results  Component Value Date   NA 142 09/27/2016   NA 140 09/26/2016   NA 136 03/01/2016   K 3.6 09/27/2016   K 4.2 09/26/2016   K 4.0 03/01/2016   CL 111 09/27/2016   CL 108 09/26/2016   CL 108 03/01/2016   CO2 23 09/27/2016   CO2 22 09/26/2016   CO2 21 (L) 03/01/2016   GLUCOSE 90 09/27/2016   GLUCOSE 121 (H) 09/26/2016   GLUCOSE 87 03/01/2016   BUN 8 09/27/2016   BUN 10 09/26/2016   BUN 5 (L) 03/01/2016   CREATININE 0.65 09/27/2016   CREATININE 0.69 09/26/2016   CREATININE 0.63 03/01/2016   CALCIUM 8.5 (L) 09/27/2016   CALCIUM 9.6 09/26/2016   CALCIUM 8.1 (L) 03/01/2016   LFT  Recent Labs  09/26/16 1224  PROT 6.9  ALBUMIN 3.8  AST 20  ALT 8*  ALKPHOS 42  BILITOT 0.9   PT/INR Lab Results  Component Value Date   INR 1.02 09/26/2016   INR 1.26 02/29/2016   INR 1.09 02/25/2016     IMPRESSION:   *  Painless hematochezia. Rule out diverticular bleed. Amount of bleeding seems excessive for hemorrhoidal bleeding but this is a possible cause. 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 S/P 2008 AAA surgical repair and 02/2016 stent graft repair of infrarenal AAA, so sentinel bleeding from aorta to enteric fistula is also in the ddx.    *  PMR, on chronic Prednisone 10 mg daily.    *  Chronic anemia with superimposed blood loss anemia.  Note orders to transfuse with PRBC x 2.   PLAN:     *  Colonoscopy 2/7 at 0830 AM.  Stop oral iron to allow for optimal bowel prep.  Split dose moviprep.  D/w pt and her son, she is agreeable to proceed.   *  Not sure pt needs 2 PRBCs but defer transfusion  decisions to attending MD.    *  Switched the IVF to D5 1/2 nml saline with 10 KCL at 50 ml/hour. This because her potassium level is borderline low normal and do not want hypokalemia on labs in AM as this would be grounds for anaesthesia postponing colonoscopy.    Jennye Moccasin  09/27/2016, 8:16 AM Pager: [phone]  ________________________________________________________________________  [facility] MD note:  I personally examined the patient, reviewed the data and agree with the assessment and plan described above.  Painless rectal bleeding in setting of large hemorrhoids. Has known PVD but this does not seem likely bleeding due to ischemia of the GI tract.  More likely hemorrhoidal, diverticular, AVM, other.  She is getting one unit blood transfusion this morning and we are preparing for colonoscopy tomorrow.    Rob Bunting, MD [facility] Pager [phone]

## 2016-09-28 NOTE — Progress Notes (Signed)
Family Medicine Teaching Service Daily Progress Note Intern Pager: [phone]  Patient name: Margaret Castillo Medical record number: [id number] Date of birth: 24-Jun-1935 Age: 82 y.o. Gender: female  Primary Care Provider: Lindwood QuaHOFFMAN,BYRON, MD Consultants: Gastroenterology Code Status: Full  Pt Overview and Major Events to Date:  Margaret Castillo is a 82 y.o. female presenting with rectal bleeding. PMH is significant for polymyalgia rheumatica, AAA (repaired), vasovagal syncope, hypothyroidism, anemia, MI (1982).  Rectal bleeding, multifactorial anemia- s/p colonoscopy with diverticula and internal and external hemorrhoids and no active bleeding.  Hgb 11.6 s/p 2u pRBCs. Further bleeding overnight -monitor on telemetry -GI consulted, appreciate recs -continue daily iron -B12 as outpatient   Hypothroidism- currently taking synthroid 110 qod and 112 qod. Last TSH not documented in chart -continue home synthroid -TSH 0.283, consider recheck outpatient  Polymyalgia rheumatica- on prednisone 10mg  daily -continue home prednisone -patient should follow up with rheumatology outpatient   CAD- on daily aspirin at home -hold aspirin in setting of bleeding  FEN/GI: Heart diet, SLIV Prophylaxis: SCD's, no pharmalogic VTE due to bleed  Disposition: pending clinical improvement  Subjective:  Margaret Castillo denies abdominal pain. Reports episode of waking in pool of blood in bed overnight.  Objective: Temp:  [97.5 F (36.4 C)-99.1 F (37.3 C)] 97.9 F (36.6 C) (02/07 0852) Pulse Rate:  [63-74] 63 (02/07 0900) Resp:  [16-19] 18 (02/07 0900) BP: (93-112)/(47-77) 110/72 (02/07 0900) SpO2:  [96 %-100 %] 99 % (02/07 0900) Weight:  [182 lb 9.6 oz (82.8 kg)] 182 lb 9.6 oz (82.8 kg) (02/07 0017) Physical Exam: General: Well-appearing elderly female in NAD Cardiovascular: RRR, S1, S2, no m/r/g Respiratory: CTAB, speaking in complete sentences Abdomen: Soft, non-tender, +BS Extremities: Moves all  spontaneously, no LE edema  Laboratory:  Recent Labs Lab 09/26/16 1224 09/26/16 1915 09/27/16 0528 09/27/16 1647  WBC 9.8  --   --   --   HGB 10.0* 9.3* 8.1* 11.6*  HCT 30.7* 28.9* 24.6* 35.3*  PLT 272  --   --   --     Recent Labs Lab 09/26/16 1224 09/27/16 0528  NA 140 142  K 4.2 3.6  CL 108 111  CO2 22 23  BUN 10 8  CREATININE 0.69 0.65  CALCIUM 9.6 8.5*  PROT 6.9  --   BILITOT 0.9  --   ALKPHOS 42  --   ALT 8*  --   AST 20  --   GLUCOSE 121* 90   Imaging/Diagnostic Tests: No results found.  Erasmo DownerAngela M Bacigalupo, MD 09/28/2016, 9:12 AM PGY-3, [facility] FPTS Intern pager: [phone], text pages welcome

## 2016-09-28 NOTE — Transfer of Care (Signed)
Immediate Anesthesia Transfer of Care Note  Patient: Margaret Castillo  Procedure(s) Performed: Procedure(s): COLONOSCOPY WITH PROPOFOL (N/A)  Patient Location: Endoscopy Unit  Anesthesia Type:MAC  Level of Consciousness: awake, oriented and patient cooperative  Airway & Oxygen Therapy: Patient Spontanous Breathing and Patient connected to nasal cannula oxygen  Post-op Assessment: Report given to RN, Post -op Vital signs reviewed and stable and Patient moving all extremities  Post vital signs: Reviewed and stable  Last Vitals:  Vitals:   09/28/16 0652 09/28/16 0754  BP: 93/67 112/77  Pulse: 67 69  Resp: 16 19  Temp: 37.2 C 37.3 C    Last Pain:  Vitals:   09/28/16 0754  TempSrc: Oral  PainSc:          Complications: No apparent anesthesia complications

## 2016-09-28 NOTE — Interval H&P Note (Signed)
History and Physical Interval Note:  09/28/2016 7:59 AM  Margaret Castillo  has presented today for surgery, with the diagnosis of Hematochezia, painless.  The various methods of treatment have been discussed with the patient and family. After consideration of risks, benefits and other options for treatment, the patient has consented to  Procedure(s): COLONOSCOPY WITH PROPOFOL (N/A) as a surgical intervention .  The patient's history has been reviewed, patient examined, no change in status, stable for surgery.  I have reviewed the patient's chart and labs.  Questions were answered to the patient's satisfaction.     Rachael FeeJacobs, [place] Latulippe P

## 2016-09-28 NOTE — Op Note (Signed)
[facility] Patient Name: Margaret Castillo Procedure Date : 09/28/2016 MRN: [id number] Attending MD: Rachael Fee , MD Date of Birth: 1934-12-29 CSN: [id number] Age: 82 Admit Type: Inpatient Procedure:                Colonoscopy Indications:              Hematochezia Providers:                Rachael Fee, MD, Dwain Sarna, RN, Beryle Beams, Technician, Merril Abbe, CRNA Referring MD:              Medicines:                Monitored Anesthesia Care Complications:            No immediate complications. Estimated blood loss:                            None. Estimated Blood Loss:     Estimated blood loss: none. Procedure:                Pre-Anesthesia Assessment:                           - Prior to the procedure, a History and Physical                            was performed, and patient medications and                            allergies were reviewed. The patient's tolerance of                            previous anesthesia was also reviewed. The risks                            and benefits of the procedure and the sedation                            options and risks were discussed with the patient.                            All questions were answered, and informed consent                            was obtained. Prior Anticoagulants: The patient has                            taken no previous anticoagulant or antiplatelet                            agents. ASA Grade Assessment: III - A patient with                            severe  systemic disease. After reviewing the risks                            and benefits, the patient was deemed in                            satisfactory condition to undergo the procedure.                           After obtaining informed consent, the colonoscope                            was passed under direct vision. Throughout the                            procedure, the patient's blood pressure,  pulse, and                            oxygen saturations were monitored continuously. The                            EC-[id number] ([id number] scope was introduced through                            the anus and advanced to the the cecum, identified                            by appendiceal orifice and ileocecal valve. The                            colonoscopy was performed without difficulty. The                            patient tolerated the procedure well. The quality                            of the bowel preparation was good. The ileocecal                            valve, appendiceal orifice, and rectum were                            photographed. Scope In: 8:28:16 AM Scope Out: 8:43:40 AM Scope Withdrawal Time: 0 hours 8 minutes 40 seconds  Total Procedure Duration: 0 hours 15 minutes 24 seconds  Findings:      Multiple small and large-mouthed diverticula were found in the left       colon.      External and internal hemorrhoids were found. The hemorrhoids were       medium-sized.      The exam was otherwise without abnormality on direct and retroflexion       views. There was no new or old blood in the colon. Impression:               - Diverticulosis in the left colon.                           -  External and internal hemorrhoids.                           - The examination was otherwise normal on direct                            and retroflexion views.                           - No fresh or old blood in the colon. I suspect the                            bleeding was from hemorrhoids or left sided colon                            diverticulosis and it has clearly stopped. Moderate Sedation:      none Recommendation:           - Return patient to hospital ward for observation.                            OK to discharge tomorrow if no further significant                            bleeding.                           - Topical hemorrhoidal care twice daily for a week,                             then PRN (I will order this); she should also start                            daily OTC fiber supplement as well.                           - Follow up with GI as needed. Procedure Code(s):        --- Professional ---                           [phone], Colonoscopy, flexible; diagnostic, including                            collection of specimen(s) by brushing or washing,                            when performed (separate procedure) Diagnosis Code(s):        --- Professional ---                           B28.4, Other hemorrhoids                           K92.1, Melena (includes Hematochezia)  K57.30, Diverticulosis of large intestine without                            perforation or abscess without bleeding CPT copyright 2016 American [employer]. All rights reserved. The codes documented in this report are preliminary and upon coder review may  be revised to meet current compliance requirements. Rachael Fee, MD 09/28/2016 8:53:44 AM This report has been signed electronically. Number of Addenda: 0

## 2016-09-28 NOTE — Anesthesia Postprocedure Evaluation (Addendum)
Anesthesia Post Note  Patient: Margaret Castillo  Procedure(s) Performed: Procedure(s) (LRB): COLONOSCOPY WITH PROPOFOL (N/A)  Patient location during evaluation: PACU Anesthesia Type: MAC Level of consciousness: awake and alert Pain management: pain level controlled Vital Signs Assessment: post-procedure vital signs reviewed and stable Respiratory status: spontaneous breathing, nonlabored ventilation, respiratory function stable and patient connected to nasal cannula oxygen Cardiovascular status: stable and blood pressure returned to baseline Anesthetic complications: no       Last Vitals:  Vitals:   09/28/16 0900 09/28/16 0951  BP: 110/72 113/71  Pulse: 63 64  Resp: 18 17  Temp:  36.5 C    Last Pain:  Vitals:   09/28/16 0951  TempSrc: Oral  PainSc:                  Rye Dorado,JAMES TERRILL

## 2016-09-28 NOTE — Discharge Summary (Signed)
Family Medicine Teaching [facility] Discharge Summary  Patient name: Margaret Castillo Medical record number: [id number] Date of birth: [birth year] Age: 82 y.o. Gender: female Date of Admission: 09/26/2016  Date of Discharge: 09/29/2016 Admitting Physician: Leighton Roach McDiarmid, MD  Primary Care Provider: Lindwood Qua, MD Consultants: Gastroenterology  Indication for Hospitalization: Acute GI bleed  Discharge Diagnoses/Problem List:  1. Acute GI bleed 2. Diverticulosis 3. External hemorrhoids 4. Internal hemorrhoids  Disposition: Home  Discharge Condition: Stable  Discharge Exam:  Blood pressure 92/76, pulse 80, temperature 98 F (36.7 C), temperature source Oral, resp. rate 18, height 5\' 6"  (1.676 m), weight 183 lb 8 oz (83.2 kg), SpO2 100 %. General: Well-appearing elderly female in NAD Cardiovascular: RRR, S1, S2, no m/r/g Respiratory: CTAB, speaking in complete sentences Abdomen: Soft, non-tender, ND, +BS Extremities: Moves all spontaneously, no LE edema  Brief Hospital Course:  Margaret Castillo a 82 y.o.femalewith a hx significant for polymyalgia rheumatica (on chronic steroids), AAA (repaired 2008, revised 2017), MI (1982), hypothyroidism, anemia, and B12 deficiency who presented on 09/26/16 with 1 day of bright red blood per rectum. She was found on colonoscopy to have diverticulosis, as well as external and internal hemorroids. Hospital course by problem as below:  #Rectal bleeding  macrocytic anemia: improved. Differential includesdiverticular bleed vs hemorrhoidal bleeding. Pt with intermittent painless bleeding since 2/4, denying dyspnea, palpitations, lightheadedness. Initial hemoglobin was 10.0 (at baseline) but dropped to 8.1 on second day of admission. She received a 2U transfusion, and Hgb remained stable at 10.6 upon discharge, with a baseline ~10. Other significant labs include MCV 103, BUN wnl, PT/INR wnl. Colonoscopy 09/28/16 showed diverticulosis of the left  colon, as well as medium sized external and internal hemorrhoids. No old or new blood was noted in the colon. Patient taking daily iron supplements and monthly B12 injections for anemia. Last screening colonoscopy >10 years ago. She did have mild hypotension intermittently, with normal HR throughout admission, but this appears to be her baseline and not on any anti-hypertensives. On discharge, pt should start topical hemorrhoidal care BID x1 week, then PRN after, as well as daily OTC fiber supplement per GI recommendations.  #CAD: Home ASA 81 mg was held in the setting of bleeding  #Hypokalemia: Repleted during hospitalization. Discharged on Kdur 40 mEq x 3 days.   #Hypothroidism: TSH 0.283 09/26/16. Continued home synthroid 110 qod and 112 qod.  #All chronic issues were managed with outpatient medication regimens.  Issues for Follow Up:  1. Repeat CBC 2. Titrate synthroid dose as patient is functionally hyperthyroid on current regimen 3. Discuss whether or not to resume aspirin  Significant Procedures:  1. Colonoscopy 09/28/2016 - Diverticulosis in the left colon. - External and internal hemorrhoids. - The examination was otherwise normal on direct and retroflexion views.  Significant Labs and Imaging:   Recent Labs Lab 09/26/16 1224  09/28/16 0950 09/28/16 1501 09/29/16 0529  WBC 9.8  --  6.6  --   --   HGB 10.0*  < > 11.1* 10.7* 10.6*  HCT 30.7*  < > 33.7* 32.2* 32.0*  PLT 272  --  220  --   --   < > = values in this interval not displayed.  Recent Labs Lab 09/26/16 1224 09/27/16 0528 09/28/16 0950 09/29/16 0529  NA 140 142 140 139  K 4.2 3.6 3.4* 3.3*  CL 108 111 110 107  CO2 22 23 21* 22  GLUCOSE 121* 90 112* 95  BUN 10 8 6 7   CREATININE  0.69 0.65 0.59 0.61  CALCIUM 9.6 8.5* 8.7* 8.8*  ALKPHOS 42  --   --   --   AST 20  --   --   --   ALT 8*  --   --   --   ALBUMIN 3.8  --   --   --    Results/Tests Pending at Time of Discharge: None  Discharge Medications:   Allergies as of 09/29/2016      Reactions   Atorvastatin Other (See Comments)   MUSCLE PAIN (all statins)   Codeine Nausea Only   Crestor [rosuvastatin] Other (See Comments)   Leg pain/ joint pain  ( ALL THE STATIN's )   Penicillins Rash   Has patient had a PCN reaction causing immediate rash, facial/tongue/throat swelling, SOB or lightheadedness with hypotension: Yes Has patient had a PCN reaction causing severe rash involving mucus membranes or skin necrosis: No Has patient had a PCN reaction that required hospitalization No Has patient had a PCN reaction occurring within the last 10 years: No If all of the above answers are "NO", then may proceed with Cephalosporin use.   Adhesive [tape] Other (See Comments)   Cephalexin Rash      Medication List    STOP taking these medications   aspirin EC 81 MG tablet     TAKE these medications   acetaminophen 500 MG tablet Commonly known as:  TYLENOL Take 500-1,000 mg by mouth as needed for mild pain.   cyanocobalamin 1000 MCG/ML injection Commonly known as:  (VITAMIN B-12) Inject 1,000 mcg into the muscle every 30 (thirty) days.   ferrous sulfate 325 (65 FE) MG tablet Take 325 mg by mouth daily with breakfast.   hydrocortisone 2.5 % rectal cream Commonly known as:  ANUSOL-HC Use 2 times daily for 1 week then as needed thereafter.   meclizine 25 MG tablet Commonly known as:  ANTIVERT Take 12.5 mg by mouth 2 (two) times daily as needed for dizziness.   polyethylene glycol packet Commonly known as:  MIRALAX / GLYCOLAX Take 17 g by mouth daily as needed for mild constipation.   potassium chloride SA 20 MEQ tablet Commonly known as:  K-DUR,KLOR-CON Take 2 tablets daily for next 3 days.   predniSONE 5 MG tablet Commonly known as:  DELTASONE Take 5 mg by mouth daily.   psyllium 95 % Pack Commonly known as:  HYDROCIL/METAMUCIL Take 1 packet by mouth daily. Start taking on:  09/30/2016   levothyroxine 100 MCG tablet Commonly  known as:  SYNTHROID, LEVOTHROID Take 100 mcg by mouth every other day. Takes on odd days, takes on even days   SYNTHROID 112 MCG tablet Generic drug:  levothyroxine Take 112 mcg by mouth daily. Takes on odd days, takes on even days   triamcinolone cream 0.1 % Commonly known as:  KENALOG Apply 1 application topically as needed.   Vitamin D (Ergocalciferol) 50000 units Caps capsule Commonly known as:  DRISDOL Take 50,000 Units by mouth every 14 (fourteen) days.       Discharge Instructions: Please refer to Patient Instructions section of EMR for full details.  Patient was counseled important signs and symptoms that should prompt return to medical care, changes in medications, dietary instructions, activity restrictions, and follow up appointments.   Follow-Up Appointments: Follow-up Information    HOFFMAN,BYRON, MD. Go to.   Specialty:  Internal Medicine Why:  Hospital follow-up appointment on Thursday, 10/06/16, at 9:40 a.m. Contact information: [address]  [address] [place] [address] [phone]           Casey BurkittHillary Moen Jeanpaul Biehl, MD  [facility], PGY-2 with Medical Student Blair Heyshane Campbell, MS3 09/29/2016, 1:50 PM

## 2016-09-29 LAB — BASIC METABOLIC PANEL
ANION GAP: 10 (ref 5–15)
BUN: 7 mg/dL (ref 6–20)
CO2: 22 mmol/L (ref 22–32)
CREATININE: 0.61 mg/dL (ref 0.44–1.00)
Calcium: 8.8 mg/dL — ABNORMAL LOW (ref 8.9–10.3)
Chloride: 107 mmol/L (ref 101–111)
GFR calc Af Amer: >60 mL/min (ref >60)
GFR calc non Af Amer: >60 mL/min (ref >60)
GLUCOSE: 95 mg/dL (ref 65–99)
Potassium: 3.3 mmol/L — ABNORMAL LOW (ref 3.5–5.1)
Sodium: 139 mmol/L (ref 135–145)

## 2016-09-29 LAB — HEMOGLOBIN AND HEMATOCRIT, BLOOD
HCT: 32 % — ABNORMAL LOW (ref 36.0–46.0)
HEMOGLOBIN: 10.6 g/dL — AB (ref 12.0–15.0)

## 2016-09-29 MED ORDER — HYDROCORTISONE 2.5 % RE CREA: TOPICAL_CREAM | RECTAL | 0 refills | Status: DC

## 2016-09-29 MED ORDER — POTASSIUM CHLORIDE CRYS ER 20 MEQ PO TBCR: EXTENDED_RELEASE_TABLET | ORAL | 0 refills | Status: DC

## 2016-09-29 MED ORDER — POLYETHYLENE GLYCOL 3350 17 G PO PACK: 17.0000 g | PACK | Freq: Every day | ORAL | 0 refills | Status: AC | PRN

## 2016-09-29 MED ORDER — HYDROCORTISONE 2.5 % RE CREA
TOPICAL_CREAM | Freq: Two times a day (BID) | RECTAL | 0 refills | Status: DC
Start: 2016-09-29 — End: 2016-09-29

## 2016-09-29 MED ORDER — POTASSIUM CHLORIDE CRYS ER 20 MEQ PO TBCR
40.0000 meq | EXTENDED_RELEASE_TABLET | Freq: Once | ORAL | Status: AC
  Administered 2016-09-29: 40 meq via ORAL
  Filled 2016-09-29: qty 2

## 2016-09-29 MED ORDER — PSYLLIUM 95 % PO PACK: 1.0000 | PACK | Freq: Every day | ORAL | 2 refills | Status: DC

## 2016-09-29 NOTE — Care Management Note (Signed)
Case Management Note  Patient Details  Name: Margaret PickingLouise T Funari MRN: [id number] Date of Birth: 1934-12-11  Subjective/Objective:                Presented with rectal bleeding, hx of polymyalgia rheumatica, AAA (repaired), vasovagal syncope, hypothyroidism, anemia, MI (1982).  From home with son , [name]. Pt states independent with ADL's and uses no assistive devices.    Barnet GlasgowBryan Alvizo (Son)     [phone]        PCP: Lindwood QuaByron Hoffman  Action/Plan: Plan is to d/c to home assuming care for self with help from family if needed.  Expected Discharge Date:   09/29/2016           Expected Discharge Plan:  Home/Self Care  In-House Referral:     Discharge planning Services  CM Consult  Post Acute Care Choice:    Choice offered to:     DME Arranged:    DME Agency:     HH Arranged:    HH Agency:     Status of Service:  Completed, signed off  If discussed at [employer] Length of Stay Meetings, dates discussed:    Additional Comments:  Epifanio LeschesCole, Helyne Genther Hudson, RN 09/29/2016, 12:50 PM

## 2016-09-29 NOTE — Discharge Instructions (Signed)
[name],  You were hospitalized for concern for bleed of the gastrointestinal tract. Your colonoscopy showed diverticulosis and internal and external hemorrhoids. Most likely source of bleed was the hemorrhoids. Taking the psyllium fiber supplements and using anusol can help.   We stopped your aspirin for the time being. Please discuss whether or not to continue with your primary care doctor upon follow-up.   Your synthroid dose may be too high based on tests in the hospital. Please ask your primary care doctor about adjusting dose.  We also recommend having your potassium rechecked and to take Kdur (potassium supplement) for next 3 days.  You likely will continue to have spotting of blood with wiping due to hemorrhoids, but if you feel weak, dizzy, or have bleeding that won't stop, please seek Emergency Care.   It was a pleasure taking part of your care.   Diverticulosis Diverticulosis is the condition that develops when small pouches (diverticula) form in the wall of your colon. Your colon, or large intestine, is where water is absorbed and stool is formed. The pouches form when the inside layer of your colon pushes through weak spots in the outer layers of your colon. CAUSES  No one knows exactly what causes diverticulosis. RISK FACTORS  Being older than 50. Your risk for this condition increases with age. Diverticulosis is rare in people younger than 40 years. By age 80, almost everyone has it.  Eating a low-fiber diet.  Being frequently constipated.  Being overweight.  Not getting enough exercise.  Smoking.  Taking over-the-counter pain medicines, like aspirin and ibuprofen. SYMPTOMS  Most people with diverticulosis do not have symptoms. DIAGNOSIS  Because diverticulosis often has no symptoms, health care providers often discover the condition during an exam for other colon problems. In many cases, a health care provider will diagnose diverticulosis while using a flexible  scope to examine the colon (colonoscopy). TREATMENT  If you have never developed an infection related to diverticulosis, you may not need treatment. If you have had an infection before, treatment may include:  Eating more fruits, vegetables, and grains.  Taking a fiber supplement.  Taking a live bacteria supplement (probiotic).  Taking medicine to relax your colon. HOME CARE INSTRUCTIONS   Drink at least 6-8 glasses of water each day to prevent constipation.  Try not to strain when you have a bowel movement.  Keep all follow-up appointments. If you have had an infection before:  Increase the fiber in your diet as directed by your health care provider or dietitian.  Take a dietary fiber supplement if your health care provider approves.  Only take medicines as directed by your health care provider. SEEK MEDICAL CARE IF:   You have abdominal pain.  You have bloating.  You have cramps.  You have not gone to the bathroom in 3 days. SEEK IMMEDIATE MEDICAL CARE IF:   Your pain gets worse.  Yourbloating becomes very bad.  You have a fever or chills, and your symptoms suddenly get worse.  You begin vomiting.  You have bowel movements that are bloody or black. MAKE SURE YOU:  Understand these instructions.  Will watch your condition.  Will get help right away if you are not doing well or get worse. This information is not intended to replace advice given to you by your health care provider. Make sure you discuss any questions you have with your health care provider. Document Released: 05/05/2004 Document Revised: 08/13/2013 Document Reviewed: 07/03/2013 Elsevier Interactive Patient Education  2017  [employer].

## 2016-09-29 NOTE — Care Management Important Message (Signed)
Important Message  Patient Details  Name: Margaret Castillo MRN: [id number] Date of Birth: May 22, 1935   Medicare Important Message Given:  Yes    Dorena BodoIris Nikol Lemar 09/29/2016, 11:56 AM

## 2016-10-06 DIAGNOSIS — E876 Hypokalemia: Secondary | ICD-10-CM | POA: Diagnosis not present

## 2016-10-06 DIAGNOSIS — R531 Weakness: Secondary | ICD-10-CM | POA: Diagnosis not present

## 2016-10-06 DIAGNOSIS — K922 Gastrointestinal hemorrhage, unspecified: Secondary | ICD-10-CM | POA: Diagnosis not present

## 2016-10-06 DIAGNOSIS — D508 Other iron deficiency anemias: Secondary | ICD-10-CM | POA: Diagnosis not present

## 2016-10-06 DIAGNOSIS — Z09 Encounter for follow-up examination after completed treatment for conditions other than malignant neoplasm: Secondary | ICD-10-CM | POA: Diagnosis not present

## 2016-10-06 DIAGNOSIS — E038 Other specified hypothyroidism: Secondary | ICD-10-CM | POA: Diagnosis not present

## 2016-10-06 DIAGNOSIS — M353 Polymyalgia rheumatica: Secondary | ICD-10-CM | POA: Diagnosis not present

## 2016-10-11 ENCOUNTER — Ambulatory Visit: Payer: Medicare Other | Admitting: Vascular Surgery

## 2016-10-16 DIAGNOSIS — K922 Gastrointestinal hemorrhage, unspecified: Secondary | ICD-10-CM | POA: Diagnosis not present

## 2016-10-16 DIAGNOSIS — K5790 Diverticulosis of intestine, part unspecified, without perforation or abscess without bleeding: Secondary | ICD-10-CM | POA: Diagnosis not present

## 2016-10-16 DIAGNOSIS — D508 Other iron deficiency anemias: Secondary | ICD-10-CM | POA: Diagnosis not present

## 2016-10-16 DIAGNOSIS — R531 Weakness: Secondary | ICD-10-CM | POA: Diagnosis not present

## 2016-10-16 DIAGNOSIS — M353 Polymyalgia rheumatica: Secondary | ICD-10-CM | POA: Diagnosis not present

## 2016-10-18 DIAGNOSIS — M353 Polymyalgia rheumatica: Secondary | ICD-10-CM | POA: Diagnosis not present

## 2016-10-18 DIAGNOSIS — E538 Deficiency of other specified B group vitamins: Secondary | ICD-10-CM | POA: Diagnosis not present

## 2016-10-18 DIAGNOSIS — K5790 Diverticulosis of intestine, part unspecified, without perforation or abscess without bleeding: Secondary | ICD-10-CM | POA: Diagnosis not present

## 2016-10-18 DIAGNOSIS — D508 Other iron deficiency anemias: Secondary | ICD-10-CM | POA: Diagnosis not present

## 2016-10-18 DIAGNOSIS — K922 Gastrointestinal hemorrhage, unspecified: Secondary | ICD-10-CM | POA: Diagnosis not present

## 2016-10-18 DIAGNOSIS — R531 Weakness: Secondary | ICD-10-CM | POA: Diagnosis not present

## 2016-10-19 DIAGNOSIS — K641 Second degree hemorrhoids: Secondary | ICD-10-CM | POA: Diagnosis not present

## 2016-10-19 DIAGNOSIS — K625 Hemorrhage of anus and rectum: Secondary | ICD-10-CM | POA: Diagnosis not present

## 2016-10-19 DIAGNOSIS — Z87891 Personal history of nicotine dependence: Secondary | ICD-10-CM | POA: Diagnosis not present

## 2016-10-19 DIAGNOSIS — Z7982 Long term (current) use of aspirin: Secondary | ICD-10-CM | POA: Diagnosis not present

## 2016-10-20 DIAGNOSIS — D508 Other iron deficiency anemias: Secondary | ICD-10-CM | POA: Diagnosis not present

## 2016-10-20 DIAGNOSIS — R531 Weakness: Secondary | ICD-10-CM | POA: Diagnosis not present

## 2016-10-20 DIAGNOSIS — K5790 Diverticulosis of intestine, part unspecified, without perforation or abscess without bleeding: Secondary | ICD-10-CM | POA: Diagnosis not present

## 2016-10-20 DIAGNOSIS — K922 Gastrointestinal hemorrhage, unspecified: Secondary | ICD-10-CM | POA: Diagnosis not present

## 2016-10-20 DIAGNOSIS — M353 Polymyalgia rheumatica: Secondary | ICD-10-CM | POA: Diagnosis not present

## 2016-10-24 DIAGNOSIS — K5790 Diverticulosis of intestine, part unspecified, without perforation or abscess without bleeding: Secondary | ICD-10-CM | POA: Diagnosis not present

## 2016-10-24 DIAGNOSIS — R531 Weakness: Secondary | ICD-10-CM | POA: Diagnosis not present

## 2016-10-24 DIAGNOSIS — D508 Other iron deficiency anemias: Secondary | ICD-10-CM | POA: Diagnosis not present

## 2016-10-24 DIAGNOSIS — K922 Gastrointestinal hemorrhage, unspecified: Secondary | ICD-10-CM | POA: Diagnosis not present

## 2016-10-24 DIAGNOSIS — M353 Polymyalgia rheumatica: Secondary | ICD-10-CM | POA: Diagnosis not present

## 2016-10-25 ENCOUNTER — Encounter: Payer: Self-pay | Admitting: Gastroenterology

## 2016-10-25 ENCOUNTER — Ambulatory Visit (INDEPENDENT_AMBULATORY_CARE_PROVIDER_SITE_OTHER): Payer: Medicare HMO | Admitting: Gastroenterology

## 2016-10-25 ENCOUNTER — Other Ambulatory Visit (INDEPENDENT_AMBULATORY_CARE_PROVIDER_SITE_OTHER): Payer: Medicare HMO

## 2016-10-25 VITALS — BP 96/52 | HR 62 | Ht 65.0 in | Wt 186.0 lb

## 2016-10-25 DIAGNOSIS — K649 Unspecified hemorrhoids: Secondary | ICD-10-CM

## 2016-10-25 DIAGNOSIS — K625 Hemorrhage of anus and rectum: Secondary | ICD-10-CM

## 2016-10-25 LAB — CBC WITH DIFFERENTIAL/PLATELET
Basophils Absolute: 0.1 10*3/uL (ref 0.0–0.1)
Basophils Relative: 1.7 % (ref 0.0–3.0)
EOS PCT: 2.2 % (ref 0.0–5.0)
Eosinophils Absolute: 0.2 10*3/uL (ref 0.0–0.7)
HEMATOCRIT: 32 % — AB (ref 36.0–46.0)
HEMOGLOBIN: 10.8 g/dL — AB (ref 12.0–15.0)
LYMPHS ABS: 2.8 10*3/uL (ref 0.7–4.0)
Lymphocytes Relative: 35.4 % (ref 12.0–46.0)
MCHC: 33.9 g/dL (ref 30.0–36.0)
MCV: 99.9 fl (ref 78.0–100.0)
MONOS PCT: 8.2 % (ref 3.0–12.0)
Monocytes Absolute: 0.7 10*3/uL (ref 0.1–1.0)
Neutro Abs: 4.2 10*3/uL (ref 1.4–7.7)
Neutrophils Relative %: 52.5 % (ref 43.0–77.0)
Platelets: 266 10*3/uL (ref 150.0–400.0)
RBC: 3.2 Mil/uL — AB (ref 3.87–5.11)
RDW: 19.1 % — ABNORMAL HIGH (ref 11.5–15.5)
WBC: 8 10*3/uL (ref 4.0–10.5)

## 2016-10-25 NOTE — Progress Notes (Signed)
Review of pertinent gastrointestinal problems: 1. Overt hematochezia, brief hosp stay, 09/2016; received 2 units blood;  Colonoscopy Dr. Christella HartiganJacobs found diverticulosis, internal and external hemorrhoids, o/w normal.  This was presumed to be a diverticular bleed, self limited.   HPI: This is a very pleasant 82 year old woman whom I last saw about one month ago when she was hospitalized for overt hematochezia.  Two bleeding episodes since leaving the hospital.  These were bright red blood. These occur after a BM. No pushing or straining.  Otherwise she feels well, except for slight weakness.  Chief complaint is intermittent rectal bleeding  She has no abdominal pains, no nausea or vomiting. She is not on any blood thinners.  ROS: complete GI ROS as described in HPI.  Constitutional:  No unintentional weight loss   Past Medical History:  Diagnosis Date  . AAA (abdominal aortic aneurysm) (HCC)   . Anemia   . Arthritis    Neck  . Carotid artery occlusion   . Endoleak post endovascular aneurysm repair [facility](HCC), Type I 02/2016   [facility]  . Hearing aid worn   . Hypothyroidism   . Myocardial infarction 1982  . Polymyalgia rheumatica (HCC)     Past Surgical History:  Procedure Laterality Date  . ABDOMINAL AORTIC ANEURYSM REPAIR  2008   Infrarenal aneurysm stent graft repair  . ABDOMINAL AORTIC ENDOVASCULAR STENT GRAFT N/A 02/29/2016   Procedure: Aortic cuff/Gore;  Surgeon: Larina Earthlyodd F Early, MD;  [facility];  Service: Vascular;  Laterality: N/A;  . APPENDECTOMY    . CAROTID ENDARTERECTOMY  12-07-06   Left CEA  . CARPAL TUNNEL RELEASE Left   . CHOLECYSTECTOMY     Gall Bladder  . COLONOSCOPY WITH PROPOFOL N/A 09/28/2016   Procedure: COLONOSCOPY WITH PROPOFOL;  Surgeon: Rachael Feeaniel P Jacobs, MD;  [facility];  Service: Endoscopy;  Laterality: N/A;  . EYE SURGERY Bilateral    Cataract  . PERIPHERAL VASCULAR CATHETERIZATION N/A 02/03/2016   Procedure: Abdominal Aortogram;  Surgeon: Pryor OchoaJames D Lawson, MD;  [facility];  Service: Cardiovascular;  Laterality: N/A;  . PERIPHERAL VASCULAR CATHETERIZATION Bilateral 02/03/2016   Procedure: Lower Extremity Angiography;  Surgeon: Pryor OchoaJames D Lawson, MD;  [facility];  Service: Cardiovascular;  Laterality: Bilateral;  . TONSILLECTOMY      Current Outpatient Prescriptions  Medication Sig Dispense Refill  . acetaminophen (TYLENOL) 500 MG tablet Take 500-1,000 mg by mouth as needed for mild pain.     . cyanocobalamin (,VITAMIN B-12,) 1000 MCG/ML injection Inject 1,000 mcg into the muscle every 30 (thirty) days.     . ferrous sulfate 325 (65 FE) MG tablet Take 325 mg by mouth daily with breakfast.    . levothyroxine (SYNTHROID, LEVOTHROID) 100 MCG tablet Take 100 mcg by mouth daily. Takes 100mcg on odd days, takes 112mcg on even days    . meclizine (ANTIVERT) 25 MG tablet Take 12.5 mg by mouth 2 (two) times daily as needed for dizziness.     . polyethylene glycol (MIRALAX / GLYCOLAX) packet Take 17 g by mouth daily as needed for mild constipation. (Patient taking differently: Take 17 g by mouth daily. ) 14 each 0  . predniSONE (DELTASONE) 5 MG tablet Take 5 mg by mouth daily.    Marland Kitchen. triamcinolone cream (KENALOG) 0.1 % Apply 1 application topically as needed.     . Vitamin D, Ergocalciferol, (DRISDOL) 50000 UNITS CAPS Take 50,000 Units by mouth every 14 (fourteen)  days.      No current facility-administered medications for this visit.     Allergies as of 10/25/2016 - Review Complete 10/25/2016  Allergen Reaction Noted  . Atorvastatin Other (See Comments) 01/20/2016  . Codeine Nausea Only 08/03/2012  . Crestor [rosuvastatin] Other (See Comments) 08/13/2013  . Penicillins Rash 08/03/2012  . Adhesive [tape] Other (See Comments) 02/25/2016  . Cephalexin Rash 01/20/2016    Family History  Problem Relation Age of Onset  . Heart disease Father     After age 70   .  Heart attack Father   . Cancer Sister   . Diabetes Sister   . Cancer Brother   . Cancer Brother   . Heart disease Brother   . Cancer Brother     Prostate  . Diabetes Brother   . Stroke Mother   . Diabetes Mother   . Stroke Sister   . Heart disease Brother   . Arthritis Brother   . Cancer Brother     Prostate  . Heart attack Sister   . Cancer Paternal Grandfather     Social History   Social History  . Marital status: Widowed    Spouse name: N/A  . Number of children: N/A  . Years of education: N/A   Occupational History  . Not on file.   Social History Main Topics  . Smoking status: Former Smoker    Quit date: 08/22/1980  . Smokeless tobacco: Never Used  . Alcohol use No  . Drug use: No  . Sexual activity: Not on file   Other Topics Concern  . Not on file   Social History Narrative  . No narrative on file     Physical Exam: BP (!) 96/52   Pulse 62   Ht 5\' 5"  (1.651 m)   Wt 186 lb (84.4 kg)   BMI 30.95 kg/m  Constitutional: generally well-appearing, fairly frail Psychiatric: alert and oriented x3 Abdomen: soft, nontender, nondistended, no obvious ascites, no peritoneal signs, normal bowel sounds No peripheral edema noted in lower extremities Rectal exam with female assistant in the room: Small to medium sized nonthrombosed external hemorrhoids. No anal fissure. No distal rectal masses  Assessment and plan: 82 y.o. female with persistent intermittent rectal bleeding  Her overt hematochezia, hospital admission one month ago I do think was diverticular in origin. She bled enough to require 2 units of transfusion. Since then and even before then she has intermittent red rectal bleeding. This is happened twice since her hospital stay one month ago. She describes red blood on the tissue paper and a little dripping into the toilet. No anal pains. I do think that these intermittent episodes are hemorrhoidal related. Colonoscopy 1 month ago confirmed internal  hemorrhoids and also external hemorrhoids. I'm going to refer her to one of my partners here to consider hemorrhoidal banding therapy for the internal hemorrhoids. This will often shrink the external as well. She will also get a repeat CBC to make sure her blood counts are recovering from her overt bleeding one month ago.  Please see the "Patient Instructions" section for addition details about the plan.  Rob Bunting, MD [facility] 10/25/2016, 8:51 AM

## 2016-10-25 NOTE — Patient Instructions (Addendum)
You will have labs checked today in the basement lab.  Please head down after you check out with the front desk  (cbc).  Referral to Dr. Leone PayorGessner for hemorrhoidal banding procedure.   Appointment on 11/10/16 at 3:15 pm

## 2016-10-26 DIAGNOSIS — D508 Other iron deficiency anemias: Secondary | ICD-10-CM | POA: Diagnosis not present

## 2016-10-26 DIAGNOSIS — R531 Weakness: Secondary | ICD-10-CM | POA: Diagnosis not present

## 2016-10-26 DIAGNOSIS — K5790 Diverticulosis of intestine, part unspecified, without perforation or abscess without bleeding: Secondary | ICD-10-CM | POA: Diagnosis not present

## 2016-10-26 DIAGNOSIS — M353 Polymyalgia rheumatica: Secondary | ICD-10-CM | POA: Diagnosis not present

## 2016-10-26 DIAGNOSIS — K922 Gastrointestinal hemorrhage, unspecified: Secondary | ICD-10-CM | POA: Diagnosis not present

## 2016-11-01 DIAGNOSIS — R531 Weakness: Secondary | ICD-10-CM | POA: Diagnosis not present

## 2016-11-01 DIAGNOSIS — K5790 Diverticulosis of intestine, part unspecified, without perforation or abscess without bleeding: Secondary | ICD-10-CM | POA: Diagnosis not present

## 2016-11-01 DIAGNOSIS — M353 Polymyalgia rheumatica: Secondary | ICD-10-CM | POA: Diagnosis not present

## 2016-11-01 DIAGNOSIS — K922 Gastrointestinal hemorrhage, unspecified: Secondary | ICD-10-CM | POA: Diagnosis not present

## 2016-11-01 DIAGNOSIS — D508 Other iron deficiency anemias: Secondary | ICD-10-CM | POA: Diagnosis not present

## 2016-11-02 DIAGNOSIS — I2510 Atherosclerotic heart disease of native coronary artery without angina pectoris: Secondary | ICD-10-CM | POA: Diagnosis not present

## 2016-11-02 DIAGNOSIS — I779 Disorder of arteries and arterioles, unspecified: Secondary | ICD-10-CM | POA: Diagnosis not present

## 2016-11-02 DIAGNOSIS — I6529 Occlusion and stenosis of unspecified carotid artery: Secondary | ICD-10-CM | POA: Diagnosis not present

## 2016-11-02 DIAGNOSIS — D638 Anemia in other chronic diseases classified elsewhere: Secondary | ICD-10-CM | POA: Diagnosis not present

## 2016-11-02 DIAGNOSIS — D509 Iron deficiency anemia, unspecified: Secondary | ICD-10-CM | POA: Diagnosis not present

## 2016-11-02 DIAGNOSIS — K641 Second degree hemorrhoids: Secondary | ICD-10-CM | POA: Diagnosis not present

## 2016-11-02 DIAGNOSIS — E039 Hypothyroidism, unspecified: Secondary | ICD-10-CM | POA: Diagnosis not present

## 2016-11-02 DIAGNOSIS — I252 Old myocardial infarction: Secondary | ICD-10-CM | POA: Diagnosis not present

## 2016-11-02 DIAGNOSIS — M353 Polymyalgia rheumatica: Secondary | ICD-10-CM | POA: Diagnosis not present

## 2016-11-02 DIAGNOSIS — Z87891 Personal history of nicotine dependence: Secondary | ICD-10-CM | POA: Diagnosis not present

## 2016-11-02 DIAGNOSIS — D519 Vitamin B12 deficiency anemia, unspecified: Secondary | ICD-10-CM | POA: Diagnosis not present

## 2016-11-02 DIAGNOSIS — I714 Abdominal aortic aneurysm, without rupture: Secondary | ICD-10-CM | POA: Diagnosis not present

## 2016-11-02 DIAGNOSIS — E038 Other specified hypothyroidism: Secondary | ICD-10-CM | POA: Diagnosis not present

## 2016-11-02 DIAGNOSIS — K625 Hemorrhage of anus and rectum: Secondary | ICD-10-CM | POA: Diagnosis not present

## 2016-11-03 DIAGNOSIS — D508 Other iron deficiency anemias: Secondary | ICD-10-CM | POA: Diagnosis not present

## 2016-11-03 DIAGNOSIS — R531 Weakness: Secondary | ICD-10-CM | POA: Diagnosis not present

## 2016-11-03 DIAGNOSIS — M353 Polymyalgia rheumatica: Secondary | ICD-10-CM | POA: Diagnosis not present

## 2016-11-03 DIAGNOSIS — K5790 Diverticulosis of intestine, part unspecified, without perforation or abscess without bleeding: Secondary | ICD-10-CM | POA: Diagnosis not present

## 2016-11-03 DIAGNOSIS — K922 Gastrointestinal hemorrhage, unspecified: Secondary | ICD-10-CM | POA: Diagnosis not present

## 2016-11-07 DIAGNOSIS — M353 Polymyalgia rheumatica: Secondary | ICD-10-CM | POA: Diagnosis not present

## 2016-11-07 DIAGNOSIS — K5790 Diverticulosis of intestine, part unspecified, without perforation or abscess without bleeding: Secondary | ICD-10-CM | POA: Diagnosis not present

## 2016-11-07 DIAGNOSIS — K922 Gastrointestinal hemorrhage, unspecified: Secondary | ICD-10-CM | POA: Diagnosis not present

## 2016-11-07 DIAGNOSIS — R531 Weakness: Secondary | ICD-10-CM | POA: Diagnosis not present

## 2016-11-07 DIAGNOSIS — D508 Other iron deficiency anemias: Secondary | ICD-10-CM | POA: Diagnosis not present

## 2016-11-09 DIAGNOSIS — K922 Gastrointestinal hemorrhage, unspecified: Secondary | ICD-10-CM | POA: Diagnosis not present

## 2016-11-09 DIAGNOSIS — R531 Weakness: Secondary | ICD-10-CM | POA: Diagnosis not present

## 2016-11-09 DIAGNOSIS — M353 Polymyalgia rheumatica: Secondary | ICD-10-CM | POA: Diagnosis not present

## 2016-11-09 DIAGNOSIS — K5790 Diverticulosis of intestine, part unspecified, without perforation or abscess without bleeding: Secondary | ICD-10-CM | POA: Diagnosis not present

## 2016-11-09 DIAGNOSIS — D508 Other iron deficiency anemias: Secondary | ICD-10-CM | POA: Diagnosis not present

## 2016-11-10 ENCOUNTER — Ambulatory Visit (INDEPENDENT_AMBULATORY_CARE_PROVIDER_SITE_OTHER): Payer: Medicare HMO | Admitting: Internal Medicine

## 2016-11-10 ENCOUNTER — Encounter: Payer: Self-pay | Admitting: Internal Medicine

## 2016-11-10 DIAGNOSIS — K642 Third degree hemorrhoids: Secondary | ICD-10-CM | POA: Diagnosis not present

## 2016-11-10 NOTE — Patient Instructions (Signed)
HEMORRHOID BANDING PROCEDURE    FOLLOW-UP CARE   1. The procedure you have had should have been relatively painless since the banding of the area involved does not have nerve endings and there is no pain sensation.  The rubber band cuts off the blood supply to the hemorrhoid and the band may fall off as soon as 48 hours after the banding (the band may occasionally be seen in the toilet bowl following a bowel movement). You may notice a temporary feeling of fullness in the rectum which should respond adequately to plain Tylenol or Motrin.  2. Following the banding, avoid strenuous exercise that evening and resume full activity the next day.  A sitz bath (soaking in a warm tub) or bidet is soothing, and can be useful for cleansing the area after bowel movements.     3. To avoid constipation, take two tablespoons of natural wheat bran, natural oat bran, flax, Benefiber or any over the counter fiber supplement and increase your water intake to 7-8 glasses daily.    4. Unless you have been prescribed anorectal medication, do not put anything inside your rectum for two weeks: No suppositories, enemas, fingers, etc.  5. Occasionally, you may have more bleeding than usual after the banding procedure.  This is often from the untreated hemorrhoids rather than the treated one.  Don't be concerned if there is a tablespoon or so of blood.  If there is more blood than this, lie flat with your bottom higher than your head and apply an ice pack to the area. If the bleeding does not stop within a half an hour or if you feel faint, call our office at [phone] or go to the emergency room.  6. Problems are not common; however, if there is a substantial amount of bleeding, severe pain, chills, fever or difficulty passing urine (very rare) or other problems, you should call us at [phone] or report to the nearest emergency room.  7. Do not stay seated continuously for more than 2-3 hours for a day or two  after the procedure.  Tighten your buttock muscles 10-15 times every two hours and take 10-15 deep breaths every 1-2 hours.  Do not spend more than a few minutes on the toilet if you cannot empty your bowel; instead re-visit the toilet at a later time.    We will see you back 11/29/16 at 3:00pm for additional banding.     I appreciate the opportunity to care for you. Stan Headarl Gessner, MD, [facility]

## 2016-11-10 NOTE — Assessment & Plan Note (Signed)
Banded RP and LL today - continue MiraLax RTC 2-4 weeks

## 2016-11-10 NOTE — Progress Notes (Signed)
   Sxs: Bleeding anal discomfort  Margaret Castillo, CMA present.  Rectal: She has grade 3 prolapsed and inflamed internal hemorrhoids seen in the right posterior and left lateral position and some mild anal skin tags.  Ansocopy: Grade 3 left lateral) posterior and grade 2 right anterior internal hemorrhoids with inflammation.   PROCEDURE NOTE: The patient presents with symptomatic grade 3  hemorrhoids, requesting rubber band ligation of his/her hemorrhoidal disease.  All risks, benefits and alternative forms of therapy were described and informed consent was obtained.   The anorectum was pre-medicated with 0.125% nitroglycerin and 5% lidocaine topical The decision was made to band the LL and RP internal hemorrhoids, and the [facility] O'Regan System was used to perform band ligation without complication.  Digital anorectal examination was then performed to assure proper positioning of the band, and to adjust the banded tissue as required.  The patient was discharged home without pain or other issues.  Dietary and behavioral recommendations were given and along with follow-up instructions.     The following adjunctive treatments were recommended:  She will continue MiraLAX to treat constipation  The patient will return in early April  follow-up and possible additional banding as required. No complications were encountered and the patient tolerated the procedure well.   I appreciate the opportunity to care for this patient. CC: Lindwood QuaHOFFMAN,BYRON, MD Varney Dailyaniel Jacobs M.D.

## 2016-11-10 NOTE — Progress Notes (Deleted)
   Margaret PickingLouise T Single 82 y.o. Jan 04, 1935 [id number]  Assessment & Plan:      Subjective:   Chief Complaint:  HPI    Review of Systems   Objective:   Physical Exam

## 2016-11-15 DIAGNOSIS — E538 Deficiency of other specified B group vitamins: Secondary | ICD-10-CM | POA: Diagnosis not present

## 2016-11-29 ENCOUNTER — Ambulatory Visit (INDEPENDENT_AMBULATORY_CARE_PROVIDER_SITE_OTHER): Payer: Medicare HMO | Admitting: Internal Medicine

## 2016-11-29 ENCOUNTER — Encounter: Payer: Self-pay | Admitting: Internal Medicine

## 2016-11-29 DIAGNOSIS — K642 Third degree hemorrhoids: Secondary | ICD-10-CM

## 2016-11-29 NOTE — Progress Notes (Signed)
   sxs: bleeding, soreness, prolapse all improved   Had 1 bleeding episode 8 d ago - few mins, overall improved  Patti Swazil[place], CMA present.  Rectal - prior Gr 3 RP and LL internal hemorrhoids not seen  Small-med flesh RA tag noted  Nontender, no mass  PROCEDURE NOTE: The patient presents with symptomatic grade 2 hemorrhoids, requesting rubber band ligation of his/her hemorrhoidal disease.  All risks, benefits and alternative forms of therapy were described and informed consent was obtained.   The anorectum was pre-medicated with 0.125% NTG and 5% lidocaine The decision was made to band the RA internal hemorrhoid, and the [facility] O'Regan System was used to perform band ligation without complication.  Digital anorectal examination was then performed to assure proper positioning of the band, and to adjust the banded tissue as required.  The patient was discharged home without pain or other issues.  Dietary and behavioral recommendations were given and along with follow-up instructions.     The following adjunctive treatments were recommended:  MiraLax qd - titrate for effect  The patient will return in June for  follow-up and possible additional banding as required. No complications were encountered and the patient tolerated the procedure well.  I appreciate the opportunity to care for this patient. Cc: Dr. Lorin Picket, MD

## 2016-11-29 NOTE — Patient Instructions (Addendum)
HEMORRHOID BANDING PROCEDURE    FOLLOW-UP CARE   1. The procedure you have had should have been relatively painless since the banding of the area involved does not have nerve endings and there is no pain sensation.  The rubber band cuts off the blood supply to the hemorrhoid and the band may fall off as soon as 48 hours after the banding (the band may occasionally be seen in the toilet bowl following a bowel movement). You may notice a temporary feeling of fullness in the rectum which should respond adequately to plain Tylenol or Motrin.  2. Following the banding, avoid strenuous exercise that evening and resume full activity the next day.  A sitz bath (soaking in a warm tub) or bidet is soothing, and can be useful for cleansing the area after bowel movements.     3. To avoid constipation, take two tablespoons of natural wheat bran, natural oat bran, flax, Benefiber or any over the counter fiber supplement and increase your water intake to 7-8 glasses daily.    4. Unless you have been prescribed anorectal medication, do not put anything inside your rectum for two weeks: No suppositories, enemas, fingers, etc.  5. Occasionally, you may have more bleeding than usual after the banding procedure.  This is often from the untreated hemorrhoids rather than the treated one.  Don't be concerned if there is a tablespoon or so of blood.  If there is more blood than this, lie flat with your bottom higher than your head and apply an ice pack to the area. If the bleeding does not stop within a half an hour or if you feel faint, call our office at [phone] or go to the emergency room.  6. Problems are not common; however, if there is a substantial amount of bleeding, severe pain, chills, fever or difficulty passing urine (very rare) or other problems, you should call us at [phone] or report to the nearest emergency room.  7. Do not stay seated continuously for more than 2-3 hours for a day or two  after the procedure.  Tighten your buttock muscles 10-15 times every two hours and take 10-15 deep breaths every 1-2 hours.  Do not spend more than a few minutes on the toilet if you cannot empty your bowel; instead re-visit the toilet at a later time.    Follow up with Dr Leone Payor on June 6th 2018 at 3:00pm. You may cancel if doing well.    I appreciate the opportunity to care for you. Stan Head, MD, [facility]

## 2016-11-29 NOTE — Assessment & Plan Note (Signed)
Banded RA today Improved overall  Stay on MiraLax RTC June - may cancel if ok but given Gr 3 odds are higher she will need re tx

## 2016-12-12 ENCOUNTER — Telehealth: Payer: Self-pay

## 2016-12-13 DIAGNOSIS — M353 Polymyalgia rheumatica: Secondary | ICD-10-CM | POA: Diagnosis not present

## 2016-12-13 DIAGNOSIS — B029 Zoster without complications: Secondary | ICD-10-CM | POA: Diagnosis not present

## 2016-12-13 DIAGNOSIS — L989 Disorder of the skin and subcutaneous tissue, unspecified: Secondary | ICD-10-CM | POA: Diagnosis not present

## 2016-12-13 DIAGNOSIS — E538 Deficiency of other specified B group vitamins: Secondary | ICD-10-CM | POA: Diagnosis not present

## 2016-12-13 DIAGNOSIS — K649 Unspecified hemorrhoids: Secondary | ICD-10-CM | POA: Diagnosis not present

## 2016-12-13 NOTE — Telephone Encounter (Signed)
Pt states she had a bad rectal bleed yesterday morning. Pt had banding procedure done recently. States she felt very tired all day yesterday. She has not seen any blood today but thinks she should be checked. Pt scheduled to see Willette Cluster NP tomorrow at 3pm.

## 2016-12-14 ENCOUNTER — Encounter: Payer: Self-pay | Admitting: Nurse Practitioner

## 2016-12-14 ENCOUNTER — Ambulatory Visit (INDEPENDENT_AMBULATORY_CARE_PROVIDER_SITE_OTHER): Payer: Medicare HMO | Admitting: Nurse Practitioner

## 2016-12-14 VITALS — BP 76/60 | HR 76 | Ht 63.75 in | Wt 183.0 lb

## 2016-12-14 DIAGNOSIS — K625 Hemorrhage of anus and rectum: Secondary | ICD-10-CM

## 2016-12-14 DIAGNOSIS — K648 Other hemorrhoids: Secondary | ICD-10-CM | POA: Diagnosis not present

## 2016-12-14 NOTE — Progress Notes (Signed)
     HPI: Patient is a 82 yo female known to Dr. Christella Hartigan for a hx of diverticulosis / diverticular hemorrhage, internal hemorrhoids. She has been undergoing hemorrhoidal banding by Dr. Leone Payor. Left lateral and right posterior hemorrhoids banded 11/11/15. She had banding of a right anterior hemorrhoid 11/29/16. Patient did fine until two days ago when she passed a lot of blood with a BM. Yesterday she also had some bleeding but smaller volume. Today she had a scant amount of blood when she wiped. No abdominal or rectal pain. No chest pain or dizziness. Her hgb late Feb was 11, it was 10.7 yesterday.    Past Medical History:  Diagnosis Date  . AAA (abdominal aortic aneurysm) (HCC)   . Anemia   . Arthritis    Neck  . Bladder prolapse, female, acquired   . Carotid artery occlusion   . 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 Hearing aid worn   . Hypothyroidism   . Myocardial infarction (HCC) 1982  . Polymyalgia rheumatica (HCC)   . Prolapsed internal hemorrhoids, grade 3   . Uterine prolapse     Patient's surgical history, family medical history, social history, medications and allergies were all reviewed in Epic    Physical Exam: BP (!) 76/60   Pulse 76   Ht 5' 3.75" (1.619 m)   Wt 183 lb (83 kg)   BMI 31.66 kg/m   GENERAL: pleasant elderly white female in NAD PSYCH: :Pleasant, cooperative, normal affect HEENT: conjunctiva pink, mucous membranes moist, neck supple without masses ABDOMEN:  soft, nontender, nondistended, no obvious masses,  normal bowel sounds Musculoskeletal:  Normal muscle tone, normal strength NEURO: Alert and oriented x 3, no focal neurologic deficits  ASSESSMENT and PLAN:  1. 82 yo female with hemorrhoidal banding.  Right anterior hemorrhoid last to be banded. On exam the left lateral internal hemorrhoid is swollen, protruding with stigmata of recent bleeding. Bleeding doesn't seem  to be from any post banding ulcers as none were seen. Dr. Lavon Paganini does banding procedures so I asked her to evaluate as well. -Bleeding has almost ceased. Hgb stable.  -treat with prep H supp -patient already has upcoming banding appt with Dr. Leone Payor. He can reevaluate her at that time. She will call to be seen sooner if bleeding persists  2. Hypotension. Chronic. She feels okay.   3. Diverticulosis. Brief hospital stay in Feb for presumed diverticular hemorrhage. Received 2 units of blood. Inpatient colonoscopy revealed diverticulosis, internal and external hemorrhoids  Willette Cluster , NP 12/14/2016, 3:29 PM

## 2016-12-14 NOTE — Patient Instructions (Signed)
If you are age 80 or older, your body mass index should be between 23-30. Your Body mass index is 31.66 kg/m. If this is out of the aforementioned range listed, please consider follow up with your Primary Care Provider.  If you are age 71 or younger, your body mass index should be between 19-25. Your Body mass index is 31.66 kg/m. If this is out of the aformentioned range listed, please consider follow up with your Primary Care Provider.   Use Preparation H Suppositories (over-the-counter) at bedtime for seven days.  Thank you for choosing me and [facility].  Willette Cluster, NP

## 2016-12-15 ENCOUNTER — Encounter: Payer: Self-pay | Admitting: Vascular Surgery

## 2016-12-15 NOTE — Progress Notes (Signed)
Reviewed and agree with documentation and assessment and plan. K. Veena Lataysha Vohra , MD   

## 2016-12-16 DIAGNOSIS — B029 Zoster without complications: Secondary | ICD-10-CM | POA: Diagnosis not present

## 2016-12-16 DIAGNOSIS — G5712 Meralgia paresthetica, left lower limb: Secondary | ICD-10-CM | POA: Diagnosis not present

## 2016-12-16 DIAGNOSIS — Z85828 Personal history of other malignant neoplasm of skin: Secondary | ICD-10-CM | POA: Diagnosis not present

## 2016-12-16 DIAGNOSIS — Z08 Encounter for follow-up examination after completed treatment for malignant neoplasm: Secondary | ICD-10-CM | POA: Diagnosis not present

## 2016-12-21 ENCOUNTER — Ambulatory Visit
Admission: RE | Admit: 2016-12-21 | Discharge: 2016-12-21 | Disposition: A | Discharge status: Home / Self Care | Payer: Medicare HMO | Source: Ambulatory Visit | Attending: Vascular Surgery | Admitting: Vascular Surgery

## 2016-12-21 DIAGNOSIS — I714 Abdominal aortic aneurysm, without rupture, unspecified: Secondary | ICD-10-CM

## 2016-12-21 DIAGNOSIS — I2584 Coronary atherosclerosis due to calcified coronary lesion: Secondary | ICD-10-CM | POA: Diagnosis not present

## 2016-12-21 MED ORDER — IOPAMIDOL (ISOVUE-370) INJECTION 76%
75.0000 mL | Freq: Once | INTRAVENOUS | Status: AC | PRN
  Administered 2016-12-21: 75 mL via INTRAVENOUS

## 2016-12-27 ENCOUNTER — Ambulatory Visit (HOSPITAL_COMMUNITY)
Admission: RE | Admit: 2016-12-27 | Discharge: 2016-12-27 | Disposition: A | Discharge status: Home / Self Care | Payer: Medicare HMO | Source: Ambulatory Visit | Attending: Vascular Surgery | Admitting: Vascular Surgery

## 2016-12-27 ENCOUNTER — Encounter: Payer: Self-pay | Admitting: Vascular Surgery

## 2016-12-27 ENCOUNTER — Ambulatory Visit (INDEPENDENT_AMBULATORY_CARE_PROVIDER_SITE_OTHER): Payer: Medicare HMO | Admitting: Vascular Surgery

## 2016-12-27 VITALS — BP 166/78 | HR 74 | Temp 97.1°F | Resp 18 | Ht 63.75 in | Wt 183.0 lb

## 2016-12-27 DIAGNOSIS — Z95828 Presence of other vascular implants and grafts: Secondary | ICD-10-CM | POA: Diagnosis not present

## 2016-12-27 DIAGNOSIS — Z48812 Encounter for surgical aftercare following surgery on the circulatory system: Secondary | ICD-10-CM

## 2016-12-27 DIAGNOSIS — I6523 Occlusion and stenosis of bilateral carotid arteries: Secondary | ICD-10-CM | POA: Diagnosis not present

## 2016-12-27 DIAGNOSIS — Z8679 Personal history of other diseases of the circulatory system: Secondary | ICD-10-CM

## 2016-12-27 LAB — VAS US CAROTID
LCCADSYS: -121 cm/s
LEFT ECA DIAS: -24 cm/s
LICADDIAS: -42 cm/s
Left CCA dist dias: -27 cm/s
Left CCA prox dias: 24 cm/s
Left CCA prox sys: 99 cm/s
Left ICA dist sys: -119 cm/s
Left ICA prox dias: 53 cm/s
Left ICA prox sys: 128 cm/s
RIGHT CCA MID DIAS: 23 cm/s
Right CCA prox dias: 19 cm/s
Right CCA prox sys: 44 cm/s
Right cca dist sys: -51 cm/s

## 2016-12-27 NOTE — Progress Notes (Signed)
[facility]ist of [place]  Patient name: Margaret Castillo MRN: [id number] DOB: [birth year] Sex: female  REASON FOR VISIT: Follow-up left carotid endarterectomy and stent graft repair of abdominal aortic aneurysm  HPI: Margaret Castillo is a 82 y.o. female today for follow-up. She is here today with her son. She has had difficulty with rectal bleeding and hemorrhoids neck she had 2 blood unit transfusion. Still having ongoing issues with this and may need further treatment. She also has shingles. No symptoms referable to her aneurysm and no neurologic tach deficits. She does report generalized weakness.  Past Medical History:  Diagnosis Date  . AAA (abdominal aortic aneurysm) (HCC)   . Anemia   . Arthritis    Neck  . Bladder prolapse, female, acquired   . Carotid artery occlusion   . 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 Hearing aid worn   . Hypothyroidism   . Myocardial infarction (HCC) 1982  . Polymyalgia rheumatica (HCC)   . Prolapsed internal hemorrhoids, grade 3   . Uterine prolapse     Family History  Problem Relation Age of Onset  . Heart disease Father     After age 84   . Heart attack Father   . Diabetes Sister   . Ovarian cancer Sister     may be uterine...  . Cancer Brother   . Heart disease Brother   . Diabetes Brother   . Prostate cancer Brother   . Stroke Mother   . Stroke Sister   . Heart disease Brother   . Arthritis Brother   . Prostate cancer Brother   . Heart attack Sister   . Diabetes Paternal Grandfather     SOCIAL HISTORY: Social History  Substance Use Topics  . Smoking status: Former Smoker    Quit date: 08/22/1980  . Smokeless tobacco: Never Used  . Alcohol use No    Allergies  Allergen Reactions  . Atorvastatin Other (See Comments)    MUSCLE PAIN (all statins)  . Codeine Nausea Only  . Crestor  [Rosuvastatin] Other (See Comments)    Leg pain/ joint pain  ( ALL THE STATIN's )  . Penicillins Rash    Has patient had a PCN reaction causing immediate rash, facial/tongue/throat swelling, SOB or lightheadedness with hypotension: Yes Has patient had a PCN reaction causing severe rash involving mucus membranes or skin necrosis: No Has patient had a PCN reaction that required hospitalization No Has patient had a PCN reaction occurring within the last 10 years: No If all of the above answers are "NO", then may proceed with Cephalosporin use.   . Adhesive [Tape] Other (See Comments)  . Cephalexin Rash    Current Outpatient Prescriptions  Medication Sig Dispense Refill  . acetaminophen (TYLENOL) 500 MG tablet Take 500-1,000 mg by mouth as needed for mild pain.     . cyanocobalamin (,VITAMIN B-12,) 1000 MCG/ML injection Inject 1,000 mcg into the muscle every 30 (thirty) days.     . ferrous sulfate 325 (65 FE) MG tablet Take 325 mg by mouth daily with breakfast.    . levothyroxine (SYNTHROID, LEVOTHROID) 100 MCG tablet Take 100 mcg by mouth daily.     . meclizine (ANTIVERT) 25 MG tablet Take 12.5 mg by mouth 2 (two) times daily as needed for dizziness.     . polyethylene glycol (MIRALAX / GLYCOLAX) packet Take 17 g by mouth daily as  needed for mild constipation. (Patient taking differently: Take 17 g by mouth daily. ) 14 each 0  . predniSONE (DELTASONE) 5 MG tablet Take 5 mg by mouth daily.    Marland Kitchen. triamcinolone cream (KENALOG) 0.1 % Apply 1 application topically as needed.     . Vitamin D, Ergocalciferol, (DRISDOL) 50000 UNITS CAPS Take 50,000 Units by mouth every 14 (fourteen) days.      No current facility-administered medications for this visit.     REVIEW OF SYSTEMS:  [X]  denotes positive finding, [ ]  denotes negative finding Cardiac  Comments:  Chest pain or chest pressure:    Shortness of breath upon exertion:    Short of breath when lying flat:    Irregular heart rhythm:          Vascular    Pain in calf, thigh, or hip brought on by ambulation:    Pain in feet at night that wakes you up from your sleep:     Blood clot in your veins:    Leg swelling:           PHYSICAL EXAM: Vitals:   12/27/16 1412 12/27/16 1417  BP: 90/60 (!) 166/78  Pulse: 74   Resp: 18   Temp: 97.1 F (36.2 C)   TempSrc: Oral   SpO2: 99%   Weight: 183 lb (83 kg)   Height: 5' 3.75" (1.619 m)     GENERAL: The patient is a well-nourished female, in no acute distress. The vital signs are documented above. CARDIOVASCULAR: 1+ radial pulses bilaterally. No aneurysm palpable with moderate obesity PULMONARY: There is good air exchange  MUSCULOSKELETAL: There are no major deformities or cyanosis. NEUROLOGIC: No focal weakness or paresthesias are detected. SKIN: There are no ulcers or rashes noted. PSYCHIATRIC: The patient has a normal affect.  DATA:  Carotid duplex on 12/27/2016 revealed widely patent endarterectomy on the left. She does have proximal common carotid artery stenosis   CT scan reveals a type II endoleak with stable maximal diameter of her aneurysm sac 4.9 cm  MEDICAL ISSUES: Stable overall. Discussed with patient. Plan see her again in one year with carotid duplex and CT of her abdomen and pelvis for stent graft follow-up    Larina Earthlyodd F. Early, MD FACS [facility] Office Tel [phone] Pager [phone]

## 2016-12-30 DIAGNOSIS — M25461 Effusion, right knee: Secondary | ICD-10-CM | POA: Diagnosis not present

## 2016-12-30 DIAGNOSIS — Z9181 History of falling: Secondary | ICD-10-CM | POA: Diagnosis not present

## 2016-12-30 DIAGNOSIS — M19072 Primary osteoarthritis, left ankle and foot: Secondary | ICD-10-CM | POA: Diagnosis not present

## 2016-12-30 DIAGNOSIS — E559 Vitamin D deficiency, unspecified: Secondary | ICD-10-CM | POA: Diagnosis not present

## 2016-12-30 DIAGNOSIS — I714 Abdominal aortic aneurysm, without rupture: Secondary | ICD-10-CM | POA: Diagnosis not present

## 2016-12-30 DIAGNOSIS — I779 Disorder of arteries and arterioles, unspecified: Secondary | ICD-10-CM | POA: Diagnosis not present

## 2016-12-30 DIAGNOSIS — E538 Deficiency of other specified B group vitamins: Secondary | ICD-10-CM | POA: Diagnosis not present

## 2016-12-30 DIAGNOSIS — M19071 Primary osteoarthritis, right ankle and foot: Secondary | ICD-10-CM | POA: Diagnosis not present

## 2016-12-30 DIAGNOSIS — M549 Dorsalgia, unspecified: Secondary | ICD-10-CM | POA: Diagnosis not present

## 2016-12-30 DIAGNOSIS — M7989 Other specified soft tissue disorders: Secondary | ICD-10-CM | POA: Diagnosis not present

## 2016-12-30 DIAGNOSIS — M353 Polymyalgia rheumatica: Secondary | ICD-10-CM | POA: Diagnosis not present

## 2016-12-30 DIAGNOSIS — Z95828 Presence of other vascular implants and grafts: Secondary | ICD-10-CM | POA: Diagnosis not present

## 2016-12-30 DIAGNOSIS — E039 Hypothyroidism, unspecified: Secondary | ICD-10-CM | POA: Diagnosis not present

## 2016-12-30 DIAGNOSIS — M25462 Effusion, left knee: Secondary | ICD-10-CM | POA: Diagnosis not present

## 2016-12-30 DIAGNOSIS — M19042 Primary osteoarthritis, left hand: Secondary | ICD-10-CM | POA: Diagnosis not present

## 2016-12-30 DIAGNOSIS — E785 Hyperlipidemia, unspecified: Secondary | ICD-10-CM | POA: Diagnosis not present

## 2017-01-02 NOTE — Addendum Note (Signed)
Addended by: Burton ApleyPETTY, Lindzy Rupert A on: 01/02/2017 10:27 AM   Modules accepted: Orders

## 2017-01-03 ENCOUNTER — Telehealth: Payer: Self-pay | Admitting: Gastroenterology

## 2017-01-03 NOTE — Telephone Encounter (Signed)
Patient has been moved up to 01/13/17 to discuss additional banding with Dr. Leone PayorGessner.  She has had more bleeding intermittently.

## 2017-01-03 NOTE — Telephone Encounter (Signed)
This pt last saw Gunnar FusiPaula and Dr Leone PayorGessner did her banding.  Sheri who should this go to?

## 2017-01-11 DIAGNOSIS — Z9181 History of falling: Secondary | ICD-10-CM | POA: Diagnosis not present

## 2017-01-11 DIAGNOSIS — E039 Hypothyroidism, unspecified: Secondary | ICD-10-CM | POA: Diagnosis not present

## 2017-01-11 DIAGNOSIS — I714 Abdominal aortic aneurysm, without rupture: Secondary | ICD-10-CM | POA: Diagnosis not present

## 2017-01-11 DIAGNOSIS — E559 Vitamin D deficiency, unspecified: Secondary | ICD-10-CM | POA: Diagnosis not present

## 2017-01-11 DIAGNOSIS — E538 Deficiency of other specified B group vitamins: Secondary | ICD-10-CM | POA: Diagnosis not present

## 2017-01-11 DIAGNOSIS — M353 Polymyalgia rheumatica: Secondary | ICD-10-CM | POA: Diagnosis not present

## 2017-01-11 DIAGNOSIS — Z95828 Presence of other vascular implants and grafts: Secondary | ICD-10-CM | POA: Diagnosis not present

## 2017-01-11 DIAGNOSIS — M549 Dorsalgia, unspecified: Secondary | ICD-10-CM | POA: Diagnosis not present

## 2017-01-11 DIAGNOSIS — E785 Hyperlipidemia, unspecified: Secondary | ICD-10-CM | POA: Diagnosis not present

## 2017-01-11 DIAGNOSIS — I779 Disorder of arteries and arterioles, unspecified: Secondary | ICD-10-CM | POA: Diagnosis not present

## 2017-01-13 ENCOUNTER — Encounter (INDEPENDENT_AMBULATORY_CARE_PROVIDER_SITE_OTHER): Payer: Self-pay

## 2017-01-13 ENCOUNTER — Ambulatory Visit (INDEPENDENT_AMBULATORY_CARE_PROVIDER_SITE_OTHER): Payer: Medicare HMO | Admitting: Internal Medicine

## 2017-01-13 ENCOUNTER — Encounter: Payer: Self-pay | Admitting: Internal Medicine

## 2017-01-13 DIAGNOSIS — K642 Third degree hemorrhoids: Secondary | ICD-10-CM

## 2017-01-13 NOTE — Assessment & Plan Note (Signed)
RA and LL banded 

## 2017-01-13 NOTE — Progress Notes (Signed)
  Hemorrhoidal banding She is status  post banding in April. All 3 columns banded had grade 3 hemorrhoids did well for a while but in the past few days that some recurrent bleeding.  [facility] present demonstrates grade 3 prolapse right anterior and left lateral internal hemorrhoids and some anal tags.  PROCEDURE NOTE: The patient presents with symptomatic grade 3 hemorrhoids, requesting rubber band ligation of his/her hemorrhoidal disease.  All risks, benefits and alternative forms of therapy were described and informed consent was obtained.   The anorectum was pre-medicated with 0.125% nitroglycerin and 5% lidocaine topical The decision was made to band the right anterior and left lateral internal hemorrhoids, and the [facility] O'Regan System was used to perform band ligation without complication.  Digital anorectal examination was then performed to assure proper positioning of the band, and to adjust the banded tissue as required.  The patient was discharged home without pain or other issues.  Dietary and behavioral recommendations were given and along with follow-up instructions.     The following adjunctive treatments were recommended:  She will continue MiraLAX  The patient will return in 2 months for  follow-up and possible additional banding as required. No complications were encountered and the patient tolerated the procedure well.

## 2017-01-13 NOTE — Patient Instructions (Addendum)
If you are age 80 or older, your body mass index should be between 23-30. Your Body mass index is 31.92 kg/m. If this is out of the aforementioned range listed, please consider follow up with your Primary Care Provider.  If you are age 80 or younger, your body mass index should be between 19-25. Your Body mass index is 31.92 kg/m. If this is out of the aformentioned range listed, please consider follow up with your Primary Care Provider.   HEMORRHOID BANDING PROCEDURE    FOLLOW-UP CARE   1. The procedure you have had should have been relatively painless since the banding of the area involved does not have nerve endings and there is no pain sensation.  The rubber band cuts off the blood supply to the hemorrhoid and the band may fall off as soon as 48 hours after the banding (the band may occasionally be seen in the toilet bowl following a bowel movement). You may notice a temporary feeling of fullness in the rectum which should respond adequately to plain Tylenol or Motrin.  2. Following the banding, avoid strenuous exercise that evening and resume full activity the next day.  A sitz bath (soaking in a warm tub) or bidet is soothing, and can be useful for cleansing the area after bowel movements.     3. To avoid constipation, take two tablespoons of natural wheat bran, natural oat bran, flax, Benefiber or any over the counter fiber supplement and increase your water intake to 7-8 glasses daily.    4. Unless you have been prescribed anorectal medication, do not put anything inside your rectum for two weeks: No suppositories, enemas, fingers, etc.  5. Occasionally, you may have more bleeding than usual after the banding procedure.  This is often from the untreated hemorrhoids rather than the treated one.  Don't be concerned if there is a tablespoon or so of blood.  If there is more blood than this, lie flat with your bottom higher than your head and apply an ice pack to the area. If the bleeding  does not stop within a half an hour or if you feel faint, call our office at [phone] or go to the emergency room.  6. Problems are not common; however, if there is a substantial amount of bleeding, severe pain, chills, fever or difficulty passing urine (very rare) or other problems, you should call us at [phone] or report to the nearest emergency room.  7. Do not stay seated continuously for more than 2-3 hours for a day or two after the procedure.  Tighten your buttock muscles 10-15 times every two hours and take 10-15 deep breaths every 1-2 hours.  Do not spend more than a few minutes on the toilet if you cannot empty your bowel; instead re-visit the toilet at a later time.    Please follow up with Dr. Leone PayorGessner on July 20th at 3:45pm.  Thank you.

## 2017-01-17 DIAGNOSIS — E538 Deficiency of other specified B group vitamins: Secondary | ICD-10-CM | POA: Diagnosis not present

## 2017-01-20 NOTE — Addendum Note (Signed)
Addendum  created 01/20/17 1045 by Sharee HolsterMassagee, Anayah Arvanitis, MD   Sign clinical note

## 2017-01-25 ENCOUNTER — Encounter: Payer: Medicare HMO | Admitting: Internal Medicine

## 2017-01-25 ENCOUNTER — Telehealth: Payer: Self-pay | Admitting: Internal Medicine

## 2017-01-25 NOTE — Telephone Encounter (Signed)
Patient with continued rectal bleeding since banding on 01/13/17.  Please advise

## 2017-01-25 NOTE — Telephone Encounter (Signed)
She has had drips of blood after defecation  The pain she was having from prolapsed hemorrhoids is gone She is not straining or bearing down with defecation or otherwise  I told her to monitor this and update us Monday and if things worsen call back sooner Anticipate this should stop with more time as things heal

## 2017-02-02 DIAGNOSIS — M792 Neuralgia and neuritis, unspecified: Secondary | ICD-10-CM | POA: Diagnosis not present

## 2017-02-02 DIAGNOSIS — E038 Other specified hypothyroidism: Secondary | ICD-10-CM | POA: Diagnosis not present

## 2017-02-02 DIAGNOSIS — E039 Hypothyroidism, unspecified: Secondary | ICD-10-CM | POA: Diagnosis not present

## 2017-02-02 DIAGNOSIS — I999 Unspecified disorder of circulatory system: Secondary | ICD-10-CM | POA: Diagnosis not present

## 2017-02-02 DIAGNOSIS — D508 Other iron deficiency anemias: Secondary | ICD-10-CM | POA: Diagnosis not present

## 2017-02-02 DIAGNOSIS — M353 Polymyalgia rheumatica: Secondary | ICD-10-CM | POA: Diagnosis not present

## 2017-02-02 DIAGNOSIS — K649 Unspecified hemorrhoids: Secondary | ICD-10-CM | POA: Diagnosis not present

## 2017-02-02 DIAGNOSIS — K625 Hemorrhage of anus and rectum: Secondary | ICD-10-CM | POA: Diagnosis not present

## 2017-02-20 DIAGNOSIS — E538 Deficiency of other specified B group vitamins: Secondary | ICD-10-CM | POA: Diagnosis not present

## 2017-03-10 ENCOUNTER — Encounter: Payer: Self-pay | Admitting: Internal Medicine

## 2017-03-10 ENCOUNTER — Ambulatory Visit (INDEPENDENT_AMBULATORY_CARE_PROVIDER_SITE_OTHER): Payer: Medicare HMO | Admitting: Internal Medicine

## 2017-03-10 ENCOUNTER — Other Ambulatory Visit (INDEPENDENT_AMBULATORY_CARE_PROVIDER_SITE_OTHER): Payer: Medicare HMO

## 2017-03-10 DIAGNOSIS — K642 Third degree hemorrhoids: Secondary | ICD-10-CM | POA: Diagnosis not present

## 2017-03-10 DIAGNOSIS — D62 Acute posthemorrhagic anemia: Secondary | ICD-10-CM | POA: Diagnosis not present

## 2017-03-10 LAB — CBC WITH DIFFERENTIAL/PLATELET
BASOS ABS: 0.1 10*3/uL (ref 0.0–0.1)
Basophils Relative: 0.9 % (ref 0.0–3.0)
EOS PCT: 0.3 % (ref 0.0–5.0)
Eosinophils Absolute: 0 10*3/uL (ref 0.0–0.7)
HEMATOCRIT: 30.7 % — AB (ref 36.0–46.0)
HEMOGLOBIN: 10.4 g/dL — AB (ref 12.0–15.0)
LYMPHS PCT: 21.5 % (ref 12.0–46.0)
Lymphs Abs: 2 10*3/uL (ref 0.7–4.0)
MCHC: 33.7 g/dL (ref 30.0–36.0)
MCV: 109.6 fl — AB (ref 78.0–100.0)
MONOS PCT: 4.9 % (ref 3.0–12.0)
Monocytes Absolute: 0.5 10*3/uL (ref 0.1–1.0)
Neutro Abs: 6.8 10*3/uL (ref 1.4–7.7)
Neutrophils Relative %: 72.4 % (ref 43.0–77.0)
Platelets: 255 10*3/uL (ref 150.0–400.0)
RBC: 2.8 Mil/uL — AB (ref 3.87–5.11)
RDW: 15.8 % — ABNORMAL HIGH (ref 11.5–15.5)
WBC: 9.4 10*3/uL (ref 4.0–10.5)

## 2017-03-10 LAB — FERRITIN: Ferritin: 535.8 ng/mL — ABNORMAL HIGH (ref 10.0–291.0)

## 2017-03-10 LAB — VITAMIN B12: Vitamin B-12: 589 pg/mL (ref 211–911)

## 2017-03-10 NOTE — Progress Notes (Signed)
   Hemorrhoidal ligation  Sxs: prolapse, bleeding prior banding March and April 2018 - sxs less overall  Flint MelterKristen Moore, PA-S present  Rectal - Pesary in place and LL and RP external components visible  Anoscopy was performed with the patient in the left lateral decubitus position while a chaperone was present and revealed 2 internal hemorrhoids with some external components  PROCEDURE NOTE: The patient presents with symptomatic grade 2  hemorrhoids, requesting rubber band ligation of his/her hemorrhoidal disease.  All risks, benefits and alternative forms of therapy were described and informed consent was obtained.   The anorectum was pre-medicated with topical lidocaine and nitroglycerin The decision was made to band the posterior and left lateral internal hemorrhoids, and the [facility] O'Regan System was used to perform band ligation without complication.  Digital anorectal examination was then performed to assure proper positioning of the band, and to adjust the banded tissue as required.  The patient was discharged home without pain or other issues.  Dietary and behavioral recommendations were given and along with follow-up instructions.     The following adjunctive treatments were recommended:  Topical lidocaine when necessary discomfort  The patient will return in 2 months for  follow-up and possible additional banding as required. No complications were encountered and the patient tolerated the procedure well.

## 2017-03-10 NOTE — Patient Instructions (Addendum)
  Your physician has requested that you go to the basement for the following lab work before leaving today: CBC/diff, Ferritin, B12   Use the Lidocaine you have at home if needed.   HEMORRHOID BANDING PROCEDURE    FOLLOW-UP CARE   1. The procedure you have had should have been relatively painless since the banding of the area involved does not have nerve endings and there is no pain sensation.  The rubber band cuts off the blood supply to the hemorrhoid and the band may fall off as soon as 48 hours after the banding (the band may occasionally be seen in the toilet bowl following a bowel movement). You may notice a temporary feeling of fullness in the rectum which should respond adequately to plain Tylenol or Motrin.  2. Following the banding, avoid strenuous exercise that evening and resume full activity the next day.  A sitz bath (soaking in a warm tub) or bidet is soothing, and can be useful for cleansing the area after bowel movements.     3. To avoid constipation, take two tablespoons of natural wheat bran, natural oat bran, flax, Benefiber or any over the counter fiber supplement and increase your water intake to 7-8 glasses daily.    4. Unless you have been prescribed anorectal medication, do not put anything inside your rectum for two weeks: No suppositories, enemas, fingers, etc.  5. Occasionally, you may have more bleeding than usual after the banding procedure.  This is often from the untreated hemorrhoids rather than the treated one.  Don't be concerned if there is a tablespoon or so of blood.  If there is more blood than this, lie flat with your bottom higher than your head and apply an ice pack to the area. If the bleeding does not stop within a half an hour or if you feel faint, call our office at [phone] or go to the emergency room.  6. Problems are not common; however, if there is a substantial amount of bleeding, severe pain, chills, fever or difficulty passing  urine (very rare) or other problems, you should call us at [phone] or report to the nearest emergency room.  7. Do not stay seated continuously for more than 2-3 hours for a day or two after the procedure.  Tighten your buttock muscles 10-15 times every two hours and take 10-15 deep breaths every 1-2 hours.  Do not spend more than a few minutes on the toilet if you cannot empty your bowel; instead re-visit the toilet at a later time.    Follow up with Dr Leone PayorGessner on 05/01/17 at 3:00PM.    I appreciate the opportunity to care for you. Stan Headarl Gessner, MD, [facility]

## 2017-03-10 NOTE — Assessment & Plan Note (Signed)
CBC, ferritin today 

## 2017-03-10 NOTE — Progress Notes (Signed)
Blood count is stable and mildly low. Iron and B12 levels are okay. Red blood cells are on the large size which is probably not an issue we will follow that up.

## 2017-03-10 NOTE — Assessment & Plan Note (Signed)
RP and LL banded RTC 2 mos 

## 2017-03-27 DIAGNOSIS — E538 Deficiency of other specified B group vitamins: Secondary | ICD-10-CM | POA: Diagnosis not present

## 2017-03-31 ENCOUNTER — Other Ambulatory Visit
Admission: RE | Admit: 2017-03-31 | Discharge: 2017-03-31 | Disposition: A | Discharge status: Home / Self Care | Payer: Medicare HMO | Source: Ambulatory Visit | Attending: Otolaryngology | Admitting: Otolaryngology

## 2017-03-31 DIAGNOSIS — E039 Hypothyroidism, unspecified: Secondary | ICD-10-CM | POA: Diagnosis not present

## 2017-03-31 LAB — TSH: TSH: 0.91 u[IU]/mL (ref 0.350–4.500)

## 2017-03-31 LAB — T4, FREE: Free T4: 1.07 ng/dL (ref 0.61–1.12)

## 2017-04-01 LAB — T3, FREE: T3, Free: 2.6 pg/mL (ref 2.0–4.4)

## 2017-04-04 DIAGNOSIS — R5381 Other malaise: Secondary | ICD-10-CM | POA: Diagnosis not present

## 2017-04-04 DIAGNOSIS — E038 Other specified hypothyroidism: Secondary | ICD-10-CM | POA: Diagnosis not present

## 2017-05-01 ENCOUNTER — Encounter: Payer: Self-pay | Admitting: Internal Medicine

## 2017-05-01 ENCOUNTER — Ambulatory Visit (INDEPENDENT_AMBULATORY_CARE_PROVIDER_SITE_OTHER): Payer: Medicare HMO | Admitting: Internal Medicine

## 2017-05-01 DIAGNOSIS — K642 Third degree hemorrhoids: Secondary | ICD-10-CM

## 2017-05-01 NOTE — Assessment & Plan Note (Signed)
Will plan to observe - could need more work but only occasional spotting. To stay on MiraLAx

## 2017-05-01 NOTE — Progress Notes (Signed)
Margaret Castillo 81 y.o. 06-10-1935 [id number]  Assessment & Plan:   Prolapsed internal hemorrhoids, grade 2 and 3 Will plan to observe - could need more work but only occasional spotting. To stay on MiraLAx   I appreciate the opportunity to care for this patient. CC: Lindwood Qua, MD Dr. Rob Bunting  Subjective:   Chief Complaint:Bleeding hemorrhoids  HPI The patient is here after review his treatment for bleeding hemorrhoids. She had been seen in March where the right posterior and left lateral [place], I banded the right anterior in April and then repeated banding of the right posterior left lateral and July. She reports that she is significantly better at this time. There is just rare spotting of blood. She is satisfied with her quality of life. She is taking MiraLAX to help bowel habits and to have easier defecation and that is working well. Allergies  Allergen Reactions  . Atorvastatin Other (See Comments)    MUSCLE PAIN (all statins)  . Codeine Nausea Only  . Crestor [Rosuvastatin] Other (See Comments)    Leg pain/ joint pain  ( ALL THE STATIN's )  . Penicillins Rash    Has patient had a PCN reaction causing immediate rash, facial/tongue/throat swelling, SOB or lightheadedness with hypotension: Yes Has patient had a PCN reaction causing severe rash involving mucus membranes or skin necrosis: No Has patient had a PCN reaction that required hospitalization No Has patient had a PCN reaction occurring within the last 10 years: No If all of the above answers are "NO", then may proceed with Cephalosporin use.   . Adhesive [Tape] Other (See Comments)  . Cephalexin Rash   Current Meds  Medication Sig  . acetaminophen (TYLENOL) 500 MG tablet Take 500-1,000 mg by mouth as needed for mild pain.   . cyanocobalamin (,VITAMIN B-12,) 1000 MCG/ML injection Inject 1,000 mcg into the muscle every 30 (thirty) days.   . ferrous sulfate 325 (65 FE) MG tablet Take 325 mg by mouth  daily with breakfast.  . levothyroxine (SYNTHROID, LEVOTHROID) 100 MCG tablet Take 100 mcg by mouth daily.   . meclizine (ANTIVERT) 25 MG tablet Take 12.5 mg by mouth 2 (two) times daily as needed for dizziness.   . Multiple Vitamin (MULTIVITAMIN) tablet Take 1 tablet by mouth daily.  . polyethylene glycol (MIRALAX / GLYCOLAX) packet Take 17 g by mouth daily as needed for mild constipation. (Patient taking differently: Take 17 g by mouth daily. )  . predniSONE (DELTASONE) 5 MG tablet Take 5 mg by mouth every other day. Alternating with 4 mg every other day  . triamcinolone cream (KENALOG) 0.1 % Apply 1 application topically as needed.   . Vitamin D, Ergocalciferol, (DRISDOL) 50000 UNITS CAPS Take 50,000 Units by mouth every 14 (fourteen) days.    Past Medical History:  Diagnosis Date  . AAA (abdominal aortic aneurysm) (HCC)   . Anemia   . Arthritis    Neck  . Bladder prolapse, female, acquired   . Carotid artery occlusion   . Endoleak post endovascular aneurysm repair [facility]), Type I 02/2016   [facility] for aortic cuff placement  . Hearing aid worn   . Hypothyroidism   . Myocardial infarction (HCC) 1982  . Polymyalgia rheumatica (HCC)   . Prolapsed internal hemorrhoids, grade 3   . Shingles   . Uterine prolapse    Past Surgical History:  Procedure Laterality Date  . ABDOMINAL AORTIC ANEURYSM REPAIR  2008   Infrarenal aneurysm stent graft repair  .  ABDOMINAL AORTIC ENDOVASCULAR STENT GRAFT N/A 02/29/2016   Procedure: Aortic cuff/Gore;  Surgeon: Larina Earthlyodd F Early, MD;  [facility];  Service: Vascular;  Laterality: N/A;  . APPENDECTOMY    . CAROTID ENDARTERECTOMY  12-07-06   Left CEA  . CARPAL TUNNEL RELEASE Left   . CATARACT EXTRACTION Bilateral   . CHOLECYSTECTOMY     Gall Bladder  . COLONOSCOPY WITH PROPOFOL N/A 09/28/2016   Procedure: COLONOSCOPY WITH PROPOFOL;  Surgeon: Rachael Feeaniel P Jacobs, MD;  [facility];  Service: Endoscopy;  Laterality: N/A;  . HEMORRHOID  BANDING    . PERIPHERAL VASCULAR CATHETERIZATION N/A 02/03/2016   Procedure: Abdominal Aortogram;  Surgeon: Pryor OchoaJames D Lawson, MD;  [facility];  Service: Cardiovascular;  Laterality: N/A;  . PERIPHERAL VASCULAR CATHETERIZATION Bilateral 02/03/2016   Procedure: Lower Extremity Angiography;  Surgeon: Pryor OchoaJames D Lawson, MD;  [facility];  Service: Cardiovascular;  Laterality: Bilateral;  . TONSILLECTOMY     Review of Systems As per history of present illness  Objective:   Physical Exam BP 100/60   Pulse 62  NAD  Deneise Leveresiree Brown, CMA present  Rectal: LL slight visible prolapse external hemorrhoid componentEasily with visible no mass. Mildly decreased sphincter tone. Some anal tags. Fleshy soft small.  Anoscopy Grade 2 internal/external hemorrhoids all positions mildly inflamed.

## 2017-05-01 NOTE — Patient Instructions (Signed)
  Continue your Miralax per Dr Leone PayorGessner.  Follow up with us as needed.    I appreciate the opportunity to care for you. Stan Headarl Gessner, MD, [facility]

## 2017-05-03 DIAGNOSIS — I714 Abdominal aortic aneurysm, without rupture: Secondary | ICD-10-CM | POA: Diagnosis not present

## 2017-05-03 DIAGNOSIS — R32 Unspecified urinary incontinence: Secondary | ICD-10-CM | POA: Diagnosis not present

## 2017-05-03 DIAGNOSIS — N8189 Other female genital prolapse: Secondary | ICD-10-CM | POA: Diagnosis not present

## 2017-05-03 DIAGNOSIS — K625 Hemorrhage of anus and rectum: Secondary | ICD-10-CM | POA: Diagnosis not present

## 2017-05-03 DIAGNOSIS — Z960 Presence of urogenital implants: Secondary | ICD-10-CM | POA: Diagnosis not present

## 2017-05-03 DIAGNOSIS — D508 Other iron deficiency anemias: Secondary | ICD-10-CM | POA: Diagnosis not present

## 2017-05-03 DIAGNOSIS — D649 Anemia, unspecified: Secondary | ICD-10-CM | POA: Diagnosis not present

## 2017-05-03 DIAGNOSIS — K649 Unspecified hemorrhoids: Secondary | ICD-10-CM | POA: Diagnosis not present

## 2017-05-03 DIAGNOSIS — R5383 Other fatigue: Secondary | ICD-10-CM | POA: Diagnosis not present

## 2017-05-03 DIAGNOSIS — E538 Deficiency of other specified B group vitamins: Secondary | ICD-10-CM | POA: Diagnosis not present

## 2017-05-03 DIAGNOSIS — R0609 Other forms of dyspnea: Secondary | ICD-10-CM | POA: Diagnosis not present

## 2017-05-03 DIAGNOSIS — M353 Polymyalgia rheumatica: Secondary | ICD-10-CM | POA: Diagnosis not present

## 2017-05-03 DIAGNOSIS — I779 Disorder of arteries and arterioles, unspecified: Secondary | ICD-10-CM | POA: Diagnosis not present

## 2017-05-29 DIAGNOSIS — G453 Amaurosis fugax: Secondary | ICD-10-CM | POA: Diagnosis not present

## 2017-06-06 DIAGNOSIS — I714 Abdominal aortic aneurysm, without rupture: Secondary | ICD-10-CM | POA: Diagnosis not present

## 2017-06-06 DIAGNOSIS — Z95828 Presence of other vascular implants and grafts: Secondary | ICD-10-CM | POA: Diagnosis not present

## 2017-06-06 DIAGNOSIS — E039 Hypothyroidism, unspecified: Secondary | ICD-10-CM | POA: Diagnosis not present

## 2017-06-06 DIAGNOSIS — E538 Deficiency of other specified B group vitamins: Secondary | ICD-10-CM | POA: Diagnosis not present

## 2017-06-06 DIAGNOSIS — M549 Dorsalgia, unspecified: Secondary | ICD-10-CM | POA: Diagnosis not present

## 2017-06-06 DIAGNOSIS — I6523 Occlusion and stenosis of bilateral carotid arteries: Secondary | ICD-10-CM | POA: Diagnosis not present

## 2017-06-06 DIAGNOSIS — M353 Polymyalgia rheumatica: Secondary | ICD-10-CM | POA: Diagnosis not present

## 2017-06-06 DIAGNOSIS — Z9181 History of falling: Secondary | ICD-10-CM | POA: Diagnosis not present

## 2017-06-06 DIAGNOSIS — I959 Hypotension, unspecified: Secondary | ICD-10-CM | POA: Diagnosis not present

## 2017-06-06 DIAGNOSIS — E785 Hyperlipidemia, unspecified: Secondary | ICD-10-CM | POA: Diagnosis not present

## 2017-06-07 DIAGNOSIS — E538 Deficiency of other specified B group vitamins: Secondary | ICD-10-CM | POA: Diagnosis not present

## 2017-06-20 DIAGNOSIS — Z0101 Encounter for examination of eyes and vision with abnormal findings: Secondary | ICD-10-CM | POA: Diagnosis not present

## 2017-06-23 ENCOUNTER — Other Ambulatory Visit: Payer: Self-pay

## 2017-06-23 ENCOUNTER — Emergency Department (HOSPITAL_COMMUNITY): Payer: Medicare HMO

## 2017-06-23 ENCOUNTER — Observation Stay (HOSPITAL_COMMUNITY)
Admission: EM | Admit: 2017-06-23 | Discharge: 2017-06-25 | Disposition: A | Discharge status: Home / Self Care | Payer: Medicare HMO | Attending: Family Medicine | Admitting: Family Medicine

## 2017-06-23 ENCOUNTER — Encounter (HOSPITAL_COMMUNITY): Payer: Self-pay

## 2017-06-23 DIAGNOSIS — Z791 Long term (current) use of non-steroidal anti-inflammatories (NSAID): Secondary | ICD-10-CM | POA: Insufficient documentation

## 2017-06-23 DIAGNOSIS — I252 Old myocardial infarction: Secondary | ICD-10-CM | POA: Insufficient documentation

## 2017-06-23 DIAGNOSIS — I9589 Other hypotension: Secondary | ICD-10-CM

## 2017-06-23 DIAGNOSIS — E039 Hypothyroidism, unspecified: Secondary | ICD-10-CM | POA: Insufficient documentation

## 2017-06-23 DIAGNOSIS — D539 Nutritional anemia, unspecified: Secondary | ICD-10-CM | POA: Diagnosis not present

## 2017-06-23 DIAGNOSIS — Z79899 Other long term (current) drug therapy: Secondary | ICD-10-CM | POA: Insufficient documentation

## 2017-06-23 DIAGNOSIS — R739 Hyperglycemia, unspecified: Secondary | ICD-10-CM | POA: Insufficient documentation

## 2017-06-23 DIAGNOSIS — R55 Syncope and collapse: Secondary | ICD-10-CM

## 2017-06-23 DIAGNOSIS — M353 Polymyalgia rheumatica: Secondary | ICD-10-CM | POA: Diagnosis not present

## 2017-06-23 DIAGNOSIS — Z880 Allergy status to penicillin: Secondary | ICD-10-CM | POA: Insufficient documentation

## 2017-06-23 DIAGNOSIS — Z888 Allergy status to other drugs, medicaments and biological substances status: Secondary | ICD-10-CM | POA: Insufficient documentation

## 2017-06-23 DIAGNOSIS — N390 Urinary tract infection, site not specified: Secondary | ICD-10-CM | POA: Diagnosis not present

## 2017-06-23 DIAGNOSIS — I959 Hypotension, unspecified: Secondary | ICD-10-CM | POA: Diagnosis not present

## 2017-06-23 DIAGNOSIS — R0989 Other specified symptoms and signs involving the circulatory and respiratory systems: Secondary | ICD-10-CM | POA: Diagnosis not present

## 2017-06-23 LAB — COMPREHENSIVE METABOLIC PANEL
ALK PHOS: 47 U/L (ref 38–126)
ALT: 10 U/L — ABNORMAL LOW (ref 14–54)
ANION GAP: 6 (ref 5–15)
AST: 25 U/L (ref 15–41)
Albumin: 3.8 g/dL (ref 3.5–5.0)
BILIRUBIN TOTAL: 0.6 mg/dL (ref 0.3–1.2)
BUN: 12 mg/dL (ref 6–20)
CALCIUM: 9.6 mg/dL (ref 8.9–10.3)
CO2: 22 mmol/L (ref 22–32)
Chloride: 107 mmol/L (ref 101–111)
Creatinine, Ser: 0.76 mg/dL (ref 0.44–1.00)
GFR calc non Af Amer: >60 mL/min (ref >60)
GLUCOSE: 143 mg/dL — AB (ref 65–99)
POTASSIUM: 4 mmol/L (ref 3.5–5.1)
Sodium: 135 mmol/L (ref 135–145)
TOTAL PROTEIN: 7.2 g/dL (ref 6.5–8.1)

## 2017-06-23 LAB — URINALYSIS, ROUTINE W REFLEX MICROSCOPIC
BILIRUBIN URINE: NEGATIVE
Glucose, UA: NEGATIVE mg/dL
Hgb urine dipstick: NEGATIVE
Ketones, ur: NEGATIVE mg/dL
Nitrite: POSITIVE — AB
PROTEIN: 30 mg/dL — AB
SPECIFIC GRAVITY, URINE: 1.023 (ref 1.005–1.030)
pH: 5 (ref 5.0–8.0)

## 2017-06-23 LAB — CBC
HEMATOCRIT: 31.6 % — AB (ref 36.0–46.0)
Hemoglobin: 10.3 g/dL — ABNORMAL LOW (ref 12.0–15.0)
MCH: 33.9 pg (ref 26.0–34.0)
MCHC: 32.6 g/dL (ref 30.0–36.0)
MCV: 103.9 fL — ABNORMAL HIGH (ref 78.0–100.0)
PLATELETS: 238 10*3/uL (ref 150–400)
RBC: 3.04 MIL/uL — ABNORMAL LOW (ref 3.87–5.11)
RDW: 16.4 % — AB (ref 11.5–15.5)
WBC: 7 10*3/uL (ref 4.0–10.5)

## 2017-06-23 LAB — LACTIC ACID, PLASMA
LACTIC ACID, VENOUS: 2.1 mmol/L — AB (ref 0.5–1.9)
LACTIC ACID, VENOUS: 2.3 mmol/L — AB (ref 0.5–1.9)

## 2017-06-23 LAB — CREATININE, SERUM: CREATININE: 0.7 mg/dL (ref 0.44–1.00)

## 2017-06-23 LAB — CBC WITH DIFFERENTIAL/PLATELET
Basophils Absolute: 0.1 10*3/uL (ref 0.0–0.1)
Basophils Relative: 1 %
EOS PCT: 1 %
Eosinophils Absolute: 0.1 10*3/uL (ref 0.0–0.7)
HCT: 32.8 % — ABNORMAL LOW (ref 36.0–46.0)
Hemoglobin: 11.1 g/dL — ABNORMAL LOW (ref 12.0–15.0)
LYMPHS ABS: 1.4 10*3/uL (ref 0.7–4.0)
LYMPHS PCT: 16 %
MCH: 35.4 pg — AB (ref 26.0–34.0)
MCHC: 33.8 g/dL (ref 30.0–36.0)
MCV: 104.5 fL — AB (ref 78.0–100.0)
MONO ABS: 0.6 10*3/uL (ref 0.1–1.0)
MONOS PCT: 7 %
Neutro Abs: 7 10*3/uL (ref 1.7–7.7)
Neutrophils Relative %: 75 %
PLATELETS: 240 10*3/uL (ref 150–400)
RBC: 3.14 MIL/uL — ABNORMAL LOW (ref 3.87–5.11)
RDW: 16.5 % — AB (ref 11.5–15.5)
WBC: 9.3 10*3/uL (ref 4.0–10.5)

## 2017-06-23 LAB — I-STAT CG4 LACTIC ACID, ED
LACTIC ACID, VENOUS: 2.22 mmol/L — AB (ref 0.5–1.9)
Lactic Acid, Venous: 2.82 mmol/L (ref 0.5–1.9)

## 2017-06-23 LAB — TROPONIN I: Troponin I: <0.03 ng/mL (ref <0.03)

## 2017-06-23 MED ORDER — ACETAMINOPHEN 500 MG PO TABS: 500.0000 mg | ORAL_TABLET | ORAL | Status: DC | PRN

## 2017-06-23 MED ORDER — SODIUM CHLORIDE 0.9 % IV BOLUS (SEPSIS)
500.0000 mL | Freq: Once | INTRAVENOUS | Status: AC
  Administered 2017-06-23: 500 mL via INTRAVENOUS

## 2017-06-23 MED ORDER — ONDANSETRON HCL 4 MG PO TABS: 4.0000 mg | ORAL_TABLET | Freq: Four times a day (QID) | ORAL | Status: DC | PRN

## 2017-06-23 MED ORDER — SODIUM CHLORIDE 0.9 % IV SOLN: INTRAVENOUS | Status: AC

## 2017-06-23 MED ORDER — FERROUS SULFATE 325 (65 FE) MG PO TABS
325.0000 mg | ORAL_TABLET | Freq: Every day | ORAL | Status: DC
  Administered 2017-06-24 – 2017-06-25 (×2): 325 mg via ORAL
  Filled 2017-06-23 (×2): qty 1

## 2017-06-23 MED ORDER — CYANOCOBALAMIN 1000 MCG/ML IJ SOLN: 1000.0000 ug | INTRAMUSCULAR | Status: DC

## 2017-06-23 MED ORDER — SODIUM CHLORIDE 0.9 % IV BOLUS (SEPSIS)
500.0000 mL | Freq: Once | INTRAVENOUS | Status: AC | PRN
  Administered 2017-06-23: 500 mL via INTRAVENOUS

## 2017-06-23 MED ORDER — ONDANSETRON HCL 4 MG/2ML IJ SOLN: 4.0000 mg | Freq: Four times a day (QID) | INTRAMUSCULAR | Status: DC | PRN

## 2017-06-23 MED ORDER — POLYETHYLENE GLYCOL 3350 17 G PO PACK
17.0000 g | PACK | Freq: Every day | ORAL | Status: DC | PRN
Start: 2017-06-23 — End: 2017-06-25

## 2017-06-23 MED ORDER — ACETAMINOPHEN 325 MG PO TABS: 650.0000 mg | ORAL_TABLET | Freq: Four times a day (QID) | ORAL | Status: DC | PRN

## 2017-06-23 MED ORDER — PREDNISONE 5 MG PO TABS: 5.0000 mg | ORAL_TABLET | Freq: Every day | ORAL | Status: DC

## 2017-06-23 MED ORDER — ENOXAPARIN SODIUM 40 MG/0.4ML ~~LOC~~ SOLN: 40.0000 mg | SUBCUTANEOUS | Status: DC

## 2017-06-23 MED ORDER — LEVOTHYROXINE SODIUM 100 MCG PO TABS
100.0000 ug | ORAL_TABLET | Freq: Every day | ORAL | Status: DC
  Administered 2017-06-24 – 2017-06-25 (×2): 100 ug via ORAL
  Filled 2017-06-23 (×3): qty 1

## 2017-06-23 MED ORDER — LEVOFLOXACIN IN D5W 750 MG/150ML IV SOLN
750.0000 mg | Freq: Once | INTRAVENOUS | Status: AC
  Administered 2017-06-23: 750 mg via INTRAVENOUS
  Filled 2017-06-23: qty 150

## 2017-06-23 NOTE — ED Triage Notes (Signed)
Pt from home, had an episode of weakness after having a bowel movement, pt stood up and felt like she was going to pass out. EMS evaluated pt and her BP was 50/30, 84/64 here. Pt states "My blood pressure is always low" Pt still feels weak and some lightheadedness, no neuro deficits.

## 2017-06-23 NOTE — ED Notes (Signed)
Spoke with Dr. Rodena MedinMessick, will run I-stat lactic acid for 1425, next lactic acid will be in main lab at 1613.

## 2017-06-23 NOTE — ED Provider Notes (Signed)
[facility] EMERGENCY DEPARTMENT Provider Note   CSN: [id number] Arrival date & time: 06/23/17  1206     History   Chief Complaint Chief Complaint  Patient presents with  . Hypotension  . Weakness    HPI Margaret Castillo is a 82 y.o. female.  82 year old female presents with complaint of near syncope. She reports that she went to the bathroom to have a BM - immediately following the BM she became weak, lightheaded, "diaphoretic," and almost passed out. She denies complete syncope. After "almost passing out" she vomited 2-3 times. The whole episode lasted about 15-20 minutes.  She now feels improved and back to baseline. She denies recent near- syncope or syncope event. She reports that her normal BP is anywhere from 80-100 systolic. She does report "going to urinate more frequently over the last 2 days." She denies dysuria.   Her PMD is Dr Mikey Bussing in [place].    The history is provided by the patient.  Near Syncope  This is a new problem. The current episode started 3 to 5 hours ago. The problem occurs rarely. The problem has been resolved. Pertinent negatives include no chest pain, no abdominal pain and no shortness of breath. Nothing aggravates the symptoms. Nothing relieves the symptoms. She has tried nothing for the symptoms.    Past Medical History:  Diagnosis Date  . AAA (abdominal aortic aneurysm) (HCC)   . Anemia   . Arthritis    Neck  . Bladder prolapse, female, acquired   . Carotid artery occlusion   . Endoleak post endovascular aneurysm repair [facility]), Type I 02/2016   [facility] for aortic cuff placement  . Hearing aid worn   . Hypothyroidism   . Myocardial infarction (HCC) 1982  . Polymyalgia rheumatica (HCC)   . Prolapsed internal hemorrhoids, grade 3   . Shingles   . Uterine prolapse     Patient Active Problem List   Diagnosis Date Noted  . Prolapsed internal hemorrhoids, grade 2 and 3   . Rectal bleeding 09/27/2016  .  Acute blood loss anemia   . AAA (abdominal aortic aneurysm) (HCC) 02/29/2016  . Occlusion and stenosis of carotid artery without mention of cerebral infarction 08/09/2012    Past Surgical History:  Procedure Laterality Date  . ABDOMINAL AORTIC ANEURYSM REPAIR  2008   Infrarenal aneurysm stent graft repair  . ABDOMINAL AORTIC ENDOVASCULAR STENT GRAFT N/A 02/29/2016   Procedure: Aortic cuff/Gore;  Surgeon: Larina Earthly, MD;  Location: [facility] OR;  Service: Vascular;  Laterality: N/A;  . APPENDECTOMY    . CAROTID ENDARTERECTOMY  12-07-06   Left CEA  . CARPAL TUNNEL RELEASE Left   . CATARACT EXTRACTION Bilateral   . CHOLECYSTECTOMY     Gall Bladder  . COLONOSCOPY WITH PROPOFOL N/A 09/28/2016   Procedure: COLONOSCOPY WITH PROPOFOL;  Surgeon: Rachael Fee, MD;  Location: [facility] ENDOSCOPY;  Service: Endoscopy;  Laterality: N/A;  . HEMORRHOID BANDING    . PERIPHERAL VASCULAR CATHETERIZATION N/A 02/03/2016   Procedure: Abdominal Aortogram;  Surgeon: Pryor Ochoa, MD;  Location: [facility] INVASIVE CV LAB;  Service: Cardiovascular;  Laterality: N/A;  . PERIPHERAL VASCULAR CATHETERIZATION Bilateral 02/03/2016   Procedure: Lower Extremity Angiography;  Surgeon: Pryor Ochoa, MD;  Location: [facility] INVASIVE CV LAB;  Service: Cardiovascular;  Laterality: Bilateral;  . TONSILLECTOMY      OB History    No data available       Home Medications  Prior to Admission medications   Medication Sig Start Date End Date Taking? Authorizing Provider  acetaminophen (TYLENOL) 500 MG tablet Take 500-1,000 mg by mouth as needed for mild pain.    Yes [provider]  cyanocobalamin (,VITAMIN B-12,) 1000 MCG/ML injection Inject 1,000 mcg into the muscle every 30 (thirty) days.  04/14/15  Yes [provider]  ferrous sulfate 325 (65 FE) MG tablet Take 325 mg by mouth daily with breakfast.   Yes [provider]  levothyroxine (SYNTHROID, LEVOTHROID) 100 MCG tablet Take 100 mcg by mouth daily.    Yes  [provider]  meclizine (ANTIVERT) 25 MG tablet Take 12.5 mg by mouth 2 (two) times daily as needed for dizziness.  03/17/14  Yes [provider]  Multiple Vitamin (MULTIVITAMIN) tablet Take 1 tablet by mouth daily.   Yes [provider]  predniSONE (DELTASONE) 5 MG tablet Take 5 mg by mouth every other day. Alternating with 4 mg every other day   Yes [provider]  tetrahydrozoline 0.05 % ophthalmic solution Place 1 drop into both eyes daily as needed (dry eyes).   Yes [provider]  triamcinolone cream (KENALOG) 0.1 % Apply 1 application topically as needed.  02/20/14  Yes [provider]  Vitamin D, Ergocalciferol, (DRISDOL) 50000 UNITS CAPS Take 50,000 Units by mouth every 14 (fourteen) days.    Yes [provider]  polyethylene glycol (MIRALAX / GLYCOLAX) packet Take 17 g by mouth daily as needed for mild constipation. Patient taking differently: Take 17 g by mouth daily.  09/29/16   Casey Burkitt, MD    Family History Family History  Problem Relation Age of Onset  . Heart disease Father        After age 75   . Heart attack Father   . Diabetes Sister   . Ovarian cancer Sister        may be uterine...  . Cancer Brother   . Heart disease Brother   . Diabetes Brother   . Prostate cancer Brother   . Stroke Mother   . Stroke Sister   . Heart disease Brother   . Arthritis Brother   . Prostate cancer Brother   . Heart attack Sister   . Diabetes Paternal Grandfather     Social History Social History  Substance Use Topics  . Smoking status: Former Smoker    Quit date: 08/22/1980  . Smokeless tobacco: Never Used  . Alcohol use No     Allergies   Atorvastatin; Codeine; Crestor [rosuvastatin]; Penicillins; Adhesive [tape]; and Cephalexin   Review of Systems Review of Systems  Respiratory: Negative for shortness of breath.   Cardiovascular: Positive for near-syncope. Negative for chest pain.    Gastrointestinal: Negative for abdominal pain.  Genitourinary:       Increased urinary frequency   All other systems reviewed and are negative.    Physical Exam Updated Vital Signs BP 99/71   Pulse 69   Temp 97.8 F (36.6 C) (Oral)   Resp 14   Ht 5\' 6"  (1.676 m)   Wt 81.6 kg (180 lb)   SpO2 100%   BMI 29.05 kg/m   Physical Exam  Constitutional: She is oriented to person, place, and time. She appears well-developed and well-nourished. No distress.  HENT:  Head: Normocephalic and atraumatic.  Mouth/Throat: Oropharynx is clear and moist.  Eyes: Pupils are equal, round, and reactive to light. Conjunctivae and EOM are normal.  Neck: Normal range of motion.  Neck supple.  Cardiovascular: Normal rate, regular rhythm and normal heart sounds.   No murmur heard. Pulmonary/Chest: Effort normal and breath sounds normal. No respiratory distress.  Abdominal: Soft. Bowel sounds are normal. She exhibits no distension. There is no tenderness.  Musculoskeletal: Normal range of motion. She exhibits no edema.  Neurological: She is alert and oriented to person, place, and time.  Skin: Skin is warm and dry.  Psychiatric: She has a normal mood and affect.  Nursing note and vitals reviewed.    ED Treatments / Results  Labs (all labs ordered are listed, but only abnormal results are displayed) Labs Reviewed  COMPREHENSIVE METABOLIC PANEL - Abnormal; Notable for the following:       Result Value   Glucose, Bld 143 (*)    ALT 10 (*)    All other components within normal limits  CBC WITH DIFFERENTIAL/PLATELET - Abnormal; Notable for the following:    RBC 3.14 (*)    Hemoglobin 11.1 (*)    HCT 32.8 (*)    MCV 104.5 (*)    MCH 35.4 (*)    RDW 16.5 (*)    All other components within normal limits  URINALYSIS, ROUTINE W REFLEX MICROSCOPIC - Abnormal; Notable for the following:    Color, Urine AMBER (*)    APPearance CLOUDY (*)    Protein, ur 30 (*)    Nitrite POSITIVE (*)    Leukocytes,  UA LARGE (*)    Bacteria, UA FEW (*)    Squamous Epithelial / LPF 6-30 (*)    All other components within normal limits  I-STAT CG4 LACTIC ACID, ED - Abnormal; Notable for the following:    Lactic Acid, Venous 2.82 (*)    All other components within normal limits  OCCULT BLOOD X 1 CARD TO LAB, STOOL  TROPONIN I  LACTIC ACID, PLASMA  LACTIC ACID, PLASMA  I-STAT CG4 LACTIC ACID, ED    EKG  EKG Interpretation  Date/Time:  Friday June 23 2017 14:25:08 EDT Ventricular Rate:  65 PR Interval:    QRS Duration: 101 QT Interval:  424 QTC Calculation: 441 R Axis:   17 Text Interpretation:  Sinus rhythm Borderline prolonged PR interval Anteroseptal infarct, age indeterminate Confirmed by Kristine Royal ([id number]) on 06/23/2017 2:52:53 PM       Radiology Dg Chest 2 View  Result Date: 06/23/2017 CLINICAL DATA:  82 year old female with chest tightness, dizziness and sweating. EXAM: CHEST  2 VIEW COMPARISON:  02/29/2016 FINDINGS: The cardiomediastinal silhouette is enlarged but stable. There is prominence of the pulmonary vasculature without frank edema. No focal opacities, pleural effusions or pneumothorax. No acute osseous abnormalities. IMPRESSION: 1. No acute intrathoracic process. 2. Stable cardiomegaly and pulmonary vascular congestion. Electronically Signed   By: Sande Brothers M.D.   On: 06/23/2017 12:59    Procedures Procedures (including critical care time)  Medications Ordered in ED Medications  sodium chloride 0.9 % bolus 500 mL (500 mLs Intravenous New Bag/Given 06/23/17 1420)     Initial Impression / Assessment and Plan / ED Course  I have reviewed the triage vital signs and the nursing notes.  Pertinent labs & imaging results that were available during my care of the patient were reviewed by me and considered in my medical decision making (see chart for details).     MSE Screen Complete  Suspect that patient's presenting BP is only marginally lower than her reported  baseline. UA is concerning in a patient on chronic steroids. Will admit for further  workup and to initiate treatment for likely UTI. Admitting service contacted and will evaluate patient for admission in the ED.   Final Clinical Impressions(s) / ED Diagnoses   Final diagnoses:  Near syncope  Urinary tract infection without hematuria, site unspecified    New Prescriptions New Prescriptions   No medications on file     Wynetta FinesMessick, Errica Dutil C, MD 06/23/17 1530

## 2017-06-23 NOTE — ED Notes (Signed)
The pt  Is comfortable  Family at the bedside  No pain  Iv antibiotic half infused

## 2017-06-23 NOTE — H&P (Signed)
[facility] History and Physical Service Pager: [phone]  Patient name: Margaret PickingLouise T Berte Medical record number: [id number] Date of birth: [birth year] Age: 82 y.o. Gender: female  Primary Care Provider: Lindwood QuaHoffman, Byron, MD Consultants: None  Code Status: Full   Chief Complaint: "felt like I was going to pass out after my bowel movement"   Assessment and Plan: Margaret PickingLouise T Ribeiro is a 82 y.o. female presenting with symptomatic hypotension after BM. PMH is significant for Polymyalgia rheumatica, carotid artery stenosis s/p left endarterectomy in 2008, AAA s/p repair in 2017, internal hemorrhoids with associated rectal bleeding s/p banding, MI in 1982, hypothyroidism, and chronic hypotension   Symptomatic Hypotension/defecation near-syncopal event: Presented after an episode of lightheadedness, nausea, diaphoresis, and vomiting x3 following a bowel movement this morning. Found to have a BP of 50/30 during the event, however on arrival BP increased to 80/62. Her baseline blood pressure stays around 80-90's systolic. Episode resolved without intervention, she is now currently back to baseline with some subjective generalized weakness. Likely a vasovagal post defecation event with associated chronic hypotension. Unlikely result of infection given afebrile without leukocytosis and stable vitals otherwise. Lactic acid elevated at 2.2, but most likely from low BP hypoperfusion. EKG with prolonged PR (noted in past <200). Negative troponin. Will monitor BP overnight, provide fluids, and check orthostatics.  -Admit to med-surg, attending Dr. Gwendolyn GrantWalden -Monitor BP  -NS fluids 75 ml/hr maintenance  -Follow up on orthostatics  -Obtain serial lactic acid levels  -Order am cortisol, r/o adrenal pathology in light of prednisone taper  -Consider midodrine   UTI, present on admission: Admitted to recent urinary frequency, however denied any dysuria or urgency. Received 1 dose of  Levaquin in the ED, allergy to penicillins and cephalosporins. Afebrile with no leukocytosis.  -Continue IV Levoquin 750mg  daily; switch to po (total 5 days)  -Follow up on urine culture     Macrocytic anemia, chronic: Hgb 11.1 on admission, baseline around 10-11 g/dl. Takes B12 injections monthly. -No active signs of bleeding  -Continue ferrous sulfate 325mg  daily with breakfast  -Monitor CBC  -Check folate -FOBT   Elevated glucose: glucose 143 on admission with previous elevated values, no history of diabetes. Likely secondary to chronic prednisone use.   -Obtain hgb A1c  -Consider further workup outpatient pending results  -Monitor BMP   Hypothyroidism: Last TSH 0.230 02/02/17 -Continue home Synthroid 100mcg daily  -Check TSH in the am  Polymyalgia rheumatica: Chronic, stable without complaints currently. Took prednisone 5mg  today, alternates with 4mg  every other day.  -Continue home prednisone 4mg  tomorrow  -Continue tylenol 500mg  as needed for pain  AAA s/p repair in 2017: Chronic, stable. No peritoneal signs. Follows with Dr. Arbie CookeyEarly. Imaging considered stable at check-up 12/2016 with CT scan showing "a type II endoleak with stable maximal diameter of her aneurysm sac 4.9 cm"  FEN/GI: Heart healthy diet  Prophylaxis: Lovenox   Disposition: Observation overnight   History of Present Illness:  Margaret Castillo is a 82 y.o. female presenting after a near syncopal event following a bowel movement this morning. She states at the end of her bowel movement she started to feel diaphoretic, lightheaded, and nauseous while still remaining seated. She vomited 3 times during the episode that lasted approximately 15-30 minutes total and resolved without intervention. She was able to walk afterwards without concern or falls. The fire department was called during this time, and noted a blood pressure of 50/30. Her blood pressure is usually 80-90's systolic and  has not tried any medications to  increase this. She denies any headache, blurry vision, tunnel vision, dizziness, loss of consciousness, or excessive straining during BM. No blood in stool or emesis. She is essentially back to her baseline, but does admit she feels more weak than usual. She has had similar episodes in the past, but usually feels better more rapidly than this episode.     ED course: On arrival she was hemodynamically stable, with exception of concerning BP of 80/62. Her BP have fluctuated around 80-106 systolic, which she and her son state is her baseline. Chest X ray showed no acute pulmonary disease, stable cardiomegaly. Significant labs include iSTAT lactic acid of 2.82 and 2.22 on repeat. Hgb 11.1, at baseline. No leukocytosis. U/A showed large leukocytes, positive nitrites, and large WBCs, however with 6-30 epithelial cells. She received a bolus of NS fluids and 1 dose of Levaquin for her UTI in the ED.   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  HENT: Positive for hearing loss (chronic). Negative for ear pain and sinus pain.   Eyes: Negative for double vision and pain.  Respiratory: Negative for cough and shortness of breath.   Cardiovascular: Negative for chest pain and palpitations.  Gastrointestinal: Positive for nausea and vomiting. Negative for abdominal pain.  Genitourinary: Positive for frequency. Negative for dysuria.  Neurological: Positive for dizziness.  Negative for focal weakness.    Patient Active Problem List   Diagnosis Date Noted  . Polymyalgia rheumatica (HCC) 06/23/2017  . Prolapsed internal hemorrhoids, grade 2 and 3   . Rectal bleeding 09/27/2016  . Acute blood loss anemia   . AAA (abdominal aortic aneurysm) (HCC) 02/29/2016  . Occlusion and stenosis of carotid artery without mention of cerebral infarction 08/09/2012    Past Medical History: Past Medical History:  Diagnosis Date  . AAA (abdominal aortic aneurysm) (HCC)   . Anemia   . Arthritis    Neck  . Bladder prolapse, female, acquired   . Carotid artery occlusion   . Endoleak post endovascular aneurysm repair [facility]), Type I 02/2016   [facility] for aortic cuff placement  . Hearing aid worn   . Hypothyroidism   . Myocardial infarction (HCC) 1982  . Polymyalgia rheumatica (HCC)   . Prolapsed internal hemorrhoids, grade 3   . Shingles   . Uterine prolapse     Past Surgical History: Past Surgical History:  Procedure Laterality Date  . ABDOMINAL AORTIC ANEURYSM REPAIR  2008   Infrarenal aneurysm stent graft repair  . ABDOMINAL AORTIC ENDOVASCULAR STENT GRAFT N/A 02/29/2016   Procedure: Aortic cuff/Gore;  Surgeon: Larina Earthly, MD;  Location: [facility] OR;  Service: Vascular;  Laterality: N/A;  . APPENDECTOMY    . CAROTID ENDARTERECTOMY  12-07-06   Left CEA  . CARPAL TUNNEL RELEASE Left   . CATARACT EXTRACTION Bilateral   . CHOLECYSTECTOMY     Gall Bladder  .  COLONOSCOPY WITH PROPOFOL N/A 09/28/2016   Procedure: COLONOSCOPY WITH PROPOFOL;  Surgeon: Rachael Fee, MD;  Location: [facility] ENDOSCOPY;  Service: Endoscopy;  Laterality: N/A;  . HEMORRHOID BANDING    . PERIPHERAL VASCULAR CATHETERIZATION N/A 02/03/2016   Procedure: Abdominal Aortogram;  Surgeon: Pryor Ochoa, MD;  Location: [facility] INVASIVE CV LAB;  Service: Cardiovascular;  Laterality: N/A;  . PERIPHERAL VASCULAR CATHETERIZATION Bilateral 02/03/2016   Procedure: Lower Extremity Angiography;  Surgeon: Pryor Ochoa, MD;  Location: [facility] INVASIVE CV LAB;  Service: Cardiovascular;  Laterality: Bilateral;  . TONSILLECTOMY      Social History: Social History  Substance Use Topics  . Smoking status: Former Smoker    Quit date: 08/22/1980  . Smokeless tobacco: Never Used  . Alcohol use No   Additional social history: Lives alone. Does not need help at home. Quit smoking after having a heart attack (10-15 year history). No drug or alcohol use  Please also refer to relevant sections of EMR.  Family History: Family History  Problem Relation Age of Onset  . Heart disease Father        After age 71   . Heart attack Father   . Diabetes Sister   . Ovarian cancer Sister        may be uterine...  . Cancer Brother   . Heart disease Brother   . Diabetes Brother   . Prostate cancer Brother   . Stroke Mother   . Stroke Sister   . Heart disease Brother   . Arthritis Brother   . Prostate cancer Brother   . Heart attack Sister   . Diabetes Paternal Grandfather     Allergies and Medications: Allergies  Allergen Reactions  . Atorvastatin Other (See Comments)    MUSCLE PAIN (all statins)  . Codeine Nausea Only  . Crestor [Rosuvastatin] Other (See Comments)    Leg pain/ joint pain  ( ALL THE STATIN's )  . Penicillins Rash    Has patient had a PCN reaction causing immediate rash, facial/tongue/throat swelling, SOB or lightheadedness with hypotension: Yes Has patient had a PCN reaction causing severe rash involving mucus membranes or skin necrosis: No Has patient had a PCN reaction that required hospitalization No Has patient had a PCN reaction occurring within the last 10 years: No If all of the above answers are "NO", then may proceed with Cephalosporin use.   . Adhesive [Tape] Other (See Comments)  . Cephalexin Rash   No current facility-administered medications on file prior to encounter.    Current Outpatient Prescriptions on File Prior to Encounter  Medication Sig Dispense Refill  .  acetaminophen (TYLENOL) 500 MG tablet Take 500-1,000 mg by mouth as needed for mild pain.     . cyanocobalamin (,VITAMIN B-12,) 1000 MCG/ML injection Inject 1,000 mcg into the muscle every 30 (thirty) days.     . ferrous sulfate 325 (65 FE) MG tablet Take 325 mg by mouth daily with breakfast.    . levothyroxine (SYNTHROID, LEVOTHROID) 100 MCG tablet Take 100 mcg by mouth daily.     . meclizine (ANTIVERT) 25 MG tablet Take 12.5 mg by mouth 2 (two) times daily as needed for dizziness.     . Multiple Vitamin (MULTIVITAMIN) tablet Take 1 tablet by mouth daily.    . predniSONE (DELTASONE) 5 MG tablet Take 5 mg by mouth every other day. Alternating with 4 mg every other day    . triamcinolone cream (KENALOG) 0.1 % Apply 1 application topically as  needed.     . Vitamin D, Ergocalciferol, (DRISDOL) 50000 UNITS CAPS Take 50,000 Units by mouth every 14 (fourteen) days.     . polyethylene glycol (MIRALAX / GLYCOLAX) packet Take 17 g by mouth daily as needed for mild constipation. (Patient taking differently: Take 17 g by mouth daily. ) 14 each 0    Objective: BP 90/65   Pulse 72   Temp 97.8 F (36.6 C) (Oral)   Resp 20   Ht 5\' 6"  (1.676 m)   Wt 180 lb (81.6 kg)   SpO2 100%   BMI 29.05 kg/m  Exam: General: Alert and oriented x4. No acute distress. Pleasant elderly woman.  Eyes: EOMI, pupils equal and reactive.  ENTM: pharynx non-erythematous, moist mucus membranes  Neck: No carotid bruits or thyromegaly noted. No cervical lymphadenopathy.  Cardiovascular: regular rate and rhythm, 2+ systolic murmur best heard in right 2nd intercostal space 2+ radial and dorsalis pedis pulses. No JVD.  Respiratory: CTA bilaterally, no wheezing, rales, or rhonchi noted.  Gastrointestinal: Soft, non-tender, non-distended. Normoactive BS x4 MSK: Extremities warm, dry.  Derm: spider angiomas noted on bilateral lower extremity.  Neuro: Alert and oriented x4. CN 2-12 grossly intact, sensation intact. 5/5 BUE and BLE  strength.  Psych: Pleasant, answers questions appropriately, appropriate affect.   Labs and Imaging: CBC BMET   Recent Labs Lab 06/23/17 1228  WBC 9.3  HGB 11.1*  HCT 32.8*  PLT 240    Recent Labs Lab 06/23/17 1228  NA 135  K 4.0  CL 107  CO2 22  BUN 12  CREATININE 0.76  GLUCOSE 143*  CALCIUM 9.6     Dg Chest 2 View  Result Date: 06/23/2017 CLINICAL DATA:  82 year old female with chest tightness, dizziness and sweating. EXAM: CHEST  2 VIEW COMPARISON:  02/29/2016 FINDINGS: The cardiomediastinal silhouette is enlarged but stable. There is prominence of the pulmonary vasculature without frank edema. No focal opacities, pleural effusions or pneumothorax. No acute osseous abnormalities. IMPRESSION: 1. No acute intrathoracic process. 2. Stable cardiomegaly and pulmonary vascular congestion. Electronically Signed   By: Sande Brothers M.D.   On: 06/23/2017 12:59   Prepared with medical student [place] Dragon.   Casey Burkitt, MD 06/23/2017, 3:25 PM PGY-3, [facility] FPTS Intern pager: [phone], text pages welcome

## 2017-06-23 NOTE — ED Notes (Signed)
Report given to rn on 5n 

## 2017-06-23 NOTE — ED Notes (Signed)
MD Tegeler informed of pt critical Lactic acid of 2.82. The patient will be placed in next available room for triage.

## 2017-06-24 DIAGNOSIS — R55 Syncope and collapse: Secondary | ICD-10-CM | POA: Diagnosis not present

## 2017-06-24 DIAGNOSIS — M353 Polymyalgia rheumatica: Secondary | ICD-10-CM | POA: Diagnosis not present

## 2017-06-24 DIAGNOSIS — I9589 Other hypotension: Secondary | ICD-10-CM

## 2017-06-24 LAB — BASIC METABOLIC PANEL
Anion gap: 8 (ref 5–15)
BUN: 8 mg/dL (ref 6–20)
CHLORIDE: 108 mmol/L (ref 101–111)
CO2: 22 mmol/L (ref 22–32)
CREATININE: 0.71 mg/dL (ref 0.44–1.00)
Calcium: 8.7 mg/dL — ABNORMAL LOW (ref 8.9–10.3)
GFR calc non Af Amer: >60 mL/min (ref >60)
Glucose, Bld: 97 mg/dL (ref 65–99)
Potassium: 3.8 mmol/L (ref 3.5–5.1)
Sodium: 138 mmol/L (ref 135–145)

## 2017-06-24 LAB — FOLATE: FOLATE: 29 ng/mL (ref >5.9)

## 2017-06-24 LAB — HEMOGLOBIN A1C
HEMOGLOBIN A1C: 6 % — AB (ref 4.8–5.6)
MEAN PLASMA GLUCOSE: 125.5 mg/dL

## 2017-06-24 LAB — CORTISOL-AM, BLOOD: Cortisol - AM: 11.6 ug/dL (ref 6.7–22.6)

## 2017-06-24 LAB — CBC
HEMATOCRIT: 30.6 % — AB (ref 36.0–46.0)
Hemoglobin: 10 g/dL — ABNORMAL LOW (ref 12.0–15.0)
MCH: 34.1 pg — AB (ref 26.0–34.0)
MCHC: 32.7 g/dL (ref 30.0–36.0)
MCV: 104.4 fL — AB (ref 78.0–100.0)
PLATELETS: 238 10*3/uL (ref 150–400)
RBC: 2.93 MIL/uL — AB (ref 3.87–5.11)
RDW: 16.5 % — ABNORMAL HIGH (ref 11.5–15.5)
WBC: 6.4 10*3/uL (ref 4.0–10.5)

## 2017-06-24 LAB — TSH: TSH: 1.614 u[IU]/mL (ref 0.350–4.500)

## 2017-06-24 LAB — LACTIC ACID, PLASMA: Lactic Acid, Venous: 1.4 mmol/L (ref 0.5–1.9)

## 2017-06-24 MED ORDER — POLYETHYLENE GLYCOL 3350 17 G PO PACK
17.0000 g | PACK | Freq: Every day | ORAL | Status: DC
  Administered 2017-06-25 (×2): 17 g via ORAL
  Filled 2017-06-24 (×2): qty 1

## 2017-06-24 MED ORDER — PREDNISONE 1 MG PO TABS
4.0000 mg | ORAL_TABLET | Freq: Once | ORAL | Status: AC
  Administered 2017-06-24: 4 mg via ORAL
  Filled 2017-06-24: qty 4

## 2017-06-24 MED ORDER — PREDNISONE 5 MG PO TABS
5.0000 mg | ORAL_TABLET | ORAL | Status: DC
  Administered 2017-06-25: 5 mg via ORAL

## 2017-06-24 MED ORDER — PREDNISONE 1 MG PO TABS: 4.0000 mg | ORAL_TABLET | Freq: Every day | ORAL | Status: DC

## 2017-06-24 MED ORDER — PREDNISONE 1 MG PO TABS: 4.0000 mg | ORAL_TABLET | ORAL | Status: DC

## 2017-06-24 MED ORDER — ENOXAPARIN SODIUM 40 MG/0.4ML ~~LOC~~ SOLN
40.0000 mg | SUBCUTANEOUS | Status: DC
  Filled 2017-06-24 (×2): qty 0.4

## 2017-06-24 MED ORDER — PREDNISONE 5 MG PO TABS: 5.0000 mg | ORAL_TABLET | Freq: Once | ORAL | Status: DC

## 2017-06-24 MED ORDER — LEVOFLOXACIN 500 MG PO TABS
750.0000 mg | ORAL_TABLET | Freq: Every day | ORAL | Status: DC
  Administered 2017-06-24 – 2017-06-25 (×2): 750 mg via ORAL
  Filled 2017-06-24 (×2): qty 2

## 2017-06-24 NOTE — Progress Notes (Signed)
Family Medicine Teaching Service Daily Progress Note Intern Pager: [phone]  Patient name: Margaret PickingLouise T Schrier Medical record number: [id number] Date of birth: 09-Sep-1934 Age: 82 y.o. Gender: female  Primary Care Provider: Lindwood QuaHoffman, Byron, MD Consultants: None  Code Status: FULL   Pt Overview and Major Events to Date:  11/2: Admitted for near syncopal event   Assessment and Plan: Margaret Castillo is a 82 y.o. female presenting with symptomatic hypotension after BM. PMH is significant for Polymyalgia rheumatica, carotid artery stenosis s/p left endarterectomy in 2008, AAA s/p repair in 2017, internal hemorrhoids with associated rectal bleeding s/p banding, MI in 1982, hypothyroidism, and chronic hypotension   Symptomatic Hypotension/defecation near-syncopal event: Likely a vasovagal post defecation event with associated chronic hypotension.  Baseline blood pressure stays around 80-90's systolic. EKG with prolonged PR (noted in past <200). Negative troponin. AM cortisol normal; obtained to rule out adrenal pathology in light of prednisone taper.  -Admit to med-surg, attending Dr. Gwendolyn GrantWalden -Monitor BP  -orthostatics vitals -Consider midodrine -ambulate to see if symptomatic    UTI, present on admission: Admitted to recent urinary frequency, however denied any dysuria or urgency.  -Levaquin 750 mg daily (11/2- ), plan for 5 day course of treatment  -Follow up on urine culture     Macrocytic anemia, chronic: Hgb 11.1 on admission, baseline around 10-11 g/dl. Takes B12 injections monthly. No active signs of bleeding.  -Continue ferrous sulfate 325mg  daily with breakfast  -Monitor CBC  -Check folate   Elevated glucose: glucose 143 on admission with previous elevated values, no history of diabetes. Likely secondary to chronic prednisone use. HgB A1c 6.0.  -further management as outpatient  -Monitor BMP   Hypothyroidism: TSH obtained and WNL at 1.6.  -Continue home Synthroid 100mcg daily     Polymyalgia rheumatica: Chronic, stable without complaints currently. Alternates Prednisone 5 mg with 4 mg daily.  -Continue home prednisone 4mg  today, 5 mg tomorrow  -Continue tylenol 500mg  as needed for pain  AAA s/p repair in 2017: Chronic, stable. No peritoneal signs. Follows with Dr. Arbie CookeyEarly. Imaging considered stable at check-up 12/2016 with CT scan showing "a type II endoleak with stable maximal diameter of her aneurysm sac 4.9 cm"  FEN/GI: Heart healthy diet  Prophylaxis: Lovenox   Disposition: Home pending stabilization of BP and symptomatic improvement   Subjective:  "Head and stomach feel much better." Has not yet been out of bed since admitted to hospital. Bed is uncomfortable and didn't get much sleep last night.   Objective: Temp:  [97.8 F (36.6 C)-98.7 F (37.1 C)] 98.7 F (37.1 C) (11/03 0500) Pulse Rate:  [64-81] 72 (11/03 0500) Resp:  [12-20] 16 (11/03 0500) BP: (60-106)/(42-77) 82/54 (11/03 0500) SpO2:  [96 %-100 %] 96 % (11/03 0500) Weight:  [180 lb (81.6 kg)] 180 lb (81.6 kg) (11/02 1223) Physical Exam: General: Pleasant elderly female in NAD  Cardiovascular: RRR. 2/6 systolic murmur present.  Respiratory: CTAB. No wheezing or crackles.  Abdomen: +BS, soft, NTND  Extremities: No LE edema.   Laboratory:  Recent Labs Lab 06/23/17 1228 06/23/17 1806 06/24/17 0559  WBC 9.3 7.0 6.4  HGB 11.1* 10.3* 10.0*  HCT 32.8* 31.6* 30.6*  PLT 240 238 238    Recent Labs Lab 06/23/17 1228 06/23/17 1806 06/24/17 0559  NA 135  --  138  K 4.0  --  3.8  CL 107  --  108  CO2 22  --  22  BUN 12  --  8  CREATININE 0.76 0.70  0.71  CALCIUM 9.6  --  8.7*  PROT 7.2  --   --   BILITOT 0.6  --   --   ALKPHOS 47  --   --   ALT 10*  --   --   AST 25  --   --   GLUCOSE 143*  --  97    Imaging/Diagnostic Tests: Dg Chest 2 View  Result Date: 06/23/2017 CLINICAL DATA:  82 year old female with chest tightness, dizziness and sweating. EXAM: CHEST  2 VIEW  COMPARISON:  02/29/2016 FINDINGS: The cardiomediastinal silhouette is enlarged but stable. There is prominence of the pulmonary vasculature without frank edema. No focal opacities, pleural effusions or pneumothorax. No acute osseous abnormalities. IMPRESSION: 1. No acute intrathoracic process. 2. Stable cardiomegaly and pulmonary vascular congestion. Electronically Signed   By: Sande Brothers M.D.   On: 06/23/2017 12:59    Arvilla Market, DO 06/24/2017, 8:54 AM PGY-3, [place] [facility] FPTS Intern pager: [phone], text pages welcome

## 2017-06-24 NOTE — Progress Notes (Signed)
RN called for SBP 70-80's, pt asymptomatic. On arrival pt lying supine in bed, skin warm and dry, alert and oriented x3, denies feeling dizzy or SOB. Pt states her BP is always low, states "never gets above 100". MD paged PTA. 500 cc NS bolus started. Plan is to continue to monitor patient, collect lactic acid with morning labs, Victorino DikeJennifer RN aware. Encouraged to call with any concerns or changes with patient.

## 2017-06-24 NOTE — Progress Notes (Signed)
Patient's orthostatic BP as follows: Lying down: 85/54, sitting 70/47, standing 75/51 and patient ambulated 100 ft with walker and assist.  She tolerated the walk well and did not get dizzy.

## 2017-06-25 ENCOUNTER — Other Ambulatory Visit: Payer: Self-pay

## 2017-06-25 DIAGNOSIS — M353 Polymyalgia rheumatica: Secondary | ICD-10-CM | POA: Diagnosis not present

## 2017-06-25 DIAGNOSIS — R55 Syncope and collapse: Secondary | ICD-10-CM | POA: Diagnosis not present

## 2017-06-25 DIAGNOSIS — I9589 Other hypotension: Secondary | ICD-10-CM | POA: Diagnosis not present

## 2017-06-25 MED ORDER — LEVOFLOXACIN 750 MG PO TABS: 750.0000 mg | ORAL_TABLET | Freq: Every day | ORAL | 0 refills | Status: AC

## 2017-06-25 NOTE — Progress Notes (Signed)
Patient discharged to home. IV removed. Her son is here to transport her. Discharge instructions provided. She verbalized understanding. Prescriptions called in.

## 2017-06-25 NOTE — Progress Notes (Signed)
Family Medicine Teaching Service Daily Progress Note Intern Pager: [phone]  Patient name: Margaret Castillo Medical record number: [id number] Date of birth: 02-18-1935 Age: 82 y.o. Gender: female  Primary Care Provider: Lindwood Qua, MD Consultants: None  Code Status: FULL   Pt Overview and Major Events to Date:  11/2: Admitted for near syncopal event   Assessment and Plan: Margaret Castillo is a 82 y.o. female presenting with symptomatic hypotension after BM. PMH is significant for Polymyalgia rheumatica, carotid artery stenosis s/p left endarterectomy in 2008, AAA s/p repair in 2017, internal hemorrhoids with associated rectal bleeding s/p banding, MI in 1982, hypothyroidism, and chronic hypotension.   Symptomatic Hypotension/defecation near-syncopal event: Likely a vasovagal post-defecation event with associated chronic hypotension.  Baseline blood pressure stays around 80-90's systolic. EKG with prolonged PR (noted in past <200). Negative troponin x1. AM cortisol normal (11.6); obtained to rule out adrenal pathology in light of prednisone taper. H/o carotid artery stenosis s/p endarterectomy 2008. Carotid doppler 12/2016 showed significant stenosis of R proximal common carotid artery, no significant L stenosis. Orthostatics negative. ECHO 2016 showed EF 60-65%, mild mitral regurg, trivial pericardial effusion vs. Subepicardial fat, diastolic LV dysfxn, dilated L atrium. Patient has been asymptomatic so far this hospitalization with ambulation. Can consider midodrine if becomes symptomatic, however has AAA and therefore would be cautious. Will likely need to obtain updated ECHO but can receive outpatient. Patient has cardiology appointment this week. - Monitor BP  - likely d/c today   UTI, present on admission: Admitted to recent urinary frequency, however denied any dysuria or urgency.  - Levaquin 750 mg daily (11/2- ), plan for 5 day course of treatment  - Follow up on urine culture      Macrocytic anemia, chronic: Hgb 11.1 on admission > 10.0, baseline around 10-11 g/dl. Folate wnl at 29. Takes B12 injections monthly. No active signs of bleeding.  - Continue ferrous sulfate 325mg  daily with breakfast  - Monitor CBC    Elevated glucose: glucose 143 on admission with previous elevated values, no history of diabetes. Likely secondary to chronic prednisone use. HgB A1c 6.0. Glucose 97 this am 11/4. - further management as outpatient  - Monitor BMP   Hypothyroidism: TSH obtained and WNL at 1.6.  -Continue home Synthroid daily   Polymyalgia rheumatica: Chronic, stable without complaints currently. Alternates Prednisone 5 mg with 4 mg daily.  - Continue home prednisone regimen - Continue tylenol 500mg  as needed for pain  AAA s/p repair in 2017: Chronic, stable. No peritoneal signs. Follows with Dr. Arbie Cookey. Imaging considered stable at check-up 12/2016 with CT scan showing "a type II endoleak with stable maximal diameter of her aneurysm sac 4.9 cm"  FEN/GI: Heart healthy diet  Prophylaxis: Lovenox   Disposition: Home pending stabilization of BP and symptomatic improvement   Subjective:  Pt feels ok today. Has walked around and had a BM without return of nausea or lightheadedness.   Objective: Temp:  [98.1 F (36.7 C)-98.4 F (36.9 C)] 98.1 F (36.7 C) (11/04 0458) Pulse Rate:  [68-104] 89 (11/04 0458) Resp:  [15-17] 15 (11/04 0458) BP: (70-88)/(47-57) 88/57 (11/04 0458) SpO2:  [91 %-97 %] 97 % (11/04 0458)   Physical Exam: General: Pleasant elderly female sitting in NAD  Cardiovascular: RRR. No murmur appreciated but noted previously. Respiratory: CTAB. No wheezing or crackles.  Abdomen: +BS, soft, NTND  Extremities: No LE edema.   Laboratory: Recent Labs  Lab 06/23/17 1228 06/23/17 1806 06/24/17 0559  WBC 9.3 7.0  6.4  HGB 11.1* 10.3* 10.0*  HCT 32.8* 31.6* 30.6*  PLT 240 238 238   Recent Labs  Lab 06/23/17 1228 06/23/17 1806  06/24/17 0559  NA 135  --  138  K 4.0  --  3.8  CL 107  --  108  CO2 22  --  22  BUN 12  --  8  CREATININE 0.76 0.70 0.71  CALCIUM 9.6  --  8.7*  PROT 7.2  --   --   BILITOT 0.6  --   --   ALKPHOS 47  --   --   ALT 10*  --   --   AST 25  --   --   GLUCOSE 143*  --  97    Imaging/Diagnostic Tests: Dg Chest 2 View  Result Date: 06/23/2017 CLINICAL DATA:  82 year old female with chest tightness, dizziness and sweating. EXAM: CHEST  2 VIEW COMPARISON:  02/29/2016 FINDINGS: The cardiomediastinal silhouette is enlarged but stable. There is prominence of the pulmonary vasculature without frank edema. No focal opacities, pleural effusions or pneumothorax. No acute osseous abnormalities. IMPRESSION: 1. No acute intrathoracic process. 2. Stable cardiomegaly and pulmonary vascular congestion. Electronically Signed   By: Sande BrothersSerena  Chacko M.D.   On: 06/23/2017 12:59    Ellwood DenseRumball, Dilan Novosad, DO 06/25/2017, 8:48 AM PGY-3, [facility] FPTS Intern pager: [phone], text pages welcome

## 2017-06-25 NOTE — Evaluation (Signed)
Occupational Therapy Evaluation Patient Details Name: Margaret PickingLouise T Castillo MRN: [id number] DOB: [birth year] Today's Date: 06/25/2017    History of Present Illness 82 y.o. female presenting with symptomatic hypotension after BM. PMH is significant for Polymyalgia rheumatica, carotid artery stenosis s/p left endarterectomy in 2008, AAA s/p repair in 2017, internal hemorrhoids with associated rectal bleeding s/p banding, MI in 1982, hypothyroidism, and chronic hypotension    Clinical Impression   PTA, pt was living alone and was independent. Pt currently performing ADLs and functional mobility at supervision-Min Guard level with exception of donning socks. Pt reporting that her son plans to stay with her at dc. Pt would benefit from further acute OT to address LB ADLs and tub transfer. Recommend dc home once medically stable with initial 24 hour supervision and HHOT to increase safety and fall prevention during ADLs and IADLs.     Follow Up Recommendations  Home health OT;Supervision/Assistance - 24 hour    Equipment Recommendations  None recommended by OT    Recommendations for Other Services PT consult     Precautions / Restrictions        Mobility Bed Mobility Overal bed mobility: Modified Independent             General bed mobility comments: Increased time and HOB elevated  Transfers Overall transfer level: Needs assistance Equipment used: None Transfers: Sit to/from Stand Sit to Stand: Min guard         General transfer comment: Min Guard for safety    Balance Overall balance assessment: Needs assistance Sitting-balance support: No upper extremity supported;Feet supported Sitting balance-Leahy Scale: Good     Standing balance support: No upper extremity supported;During functional activity Standing balance-Leahy Scale: Fair Standing balance comment: Pt able to maintain static standing without UE support                           ADL either performed  or assessed with clinical judgement   ADL Overall ADL's : Needs assistance/impaired Eating/Feeding: Set up;Sitting   Grooming: Wash/dry hands;Supervision/safety;Standing   Upper Body Bathing: Set up;Sitting   Lower Body Bathing: Sit to/from stand;Min guard   Upper Body Dressing : Set up;Sitting Upper Body Dressing Details (indicate cue type and reason): donned new gown Lower Body Dressing: Min guard;Sit to/from stand Lower Body Dressing Details (indicate cue type and reason): Pt requiring assistance to don socks. Pt reports that she does not wear socks due to difficulty donning them. Pt demonstrating good ROM to reach down and adjust socks sitting at EOB. Min Guard for safety in standing during dynamic task Toilet Transfer: Min guard;Ambulation;Regular Toilet;Grab bars   Toileting- Clothing Manipulation and Hygiene: Min guard;Sit to/from stand Toileting - Clothing Manipulation Details (indicate cue type and reason): Min Guard for safety. Pt performing toielt hygiene and clothing management without physical A     Functional mobility during ADLs: Minimal assistance(Single hand held A) General ADL Comments: Pt performing near baseline fucntional. Performing ADLs at supervision-Min Guard level. Pt requiring MIn A for functional mobility to use single hand held assist.      Vision Baseline Vision/History: Wears glasses Wears Glasses: At all times Patient Visual Report: No change from baseline       Perception     Praxis      Pertinent Vitals/Pain Pain Assessment: No/denies pain     Hand Dominance Right   Extremity/Trunk Assessment Upper Extremity Assessment Upper Extremity Assessment: Overall WFL for tasks assessed   Lower  Extremity Assessment Lower Extremity Assessment: Defer to PT evaluation       Communication Communication Communication: HOH   Cognition Arousal/Alertness: Awake/alert Behavior During Therapy: WFL for tasks assessed/performed Overall Cognitive  Status: Within Functional Limits for tasks assessed                                 General Comments: Very sweet   General Comments  Pt IV bleeding; RN notified and changed bandage    Exercises     Shoulder Instructions      Home Living Family/patient expects to be discharged to:: Private residence Living Arrangements: Alone Available Help at Discharge: Family;Available 24 hours/day(Son planning to stay at dc) Type of Home: House Home Access: Stairs to enter [employer] of Steps: 2 Entrance Stairs-Rails: Right Home Layout: One level     Bathroom Shower/Tub: Chief Strategy Officer: Standard     Home Equipment: Environmental consultant - 2 wheels;Shower seat(Used by per husband before he passed)          Prior Functioning/Environment Level of Independence: Independent        Comments: Pt performing ADLs, IADLs, occasional short distance driving, and enjoys baking. 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)        OT Problem List: Impaired balance (sitting and/or standing);Decreased knowledge of use of DME or AE      OT Treatment/Interventions: Self-care/ADL training;Therapeutic exercise;Energy conservation;DME and/or AE instruction;Therapeutic activities;Patient/family education    OT Goals(Current goals can be found in the care plan section) Acute Rehab OT Goals Patient Stated Goal: Go home OT Goal Formulation: With patient Time For Goal Achievement: 07/09/17 Potential to Achieve Goals: Good ADL Goals Pt Will Perform Grooming: Independently;standing Pt Will Perform Upper Body Dressing: Independently;sitting Pt Will Perform Lower Body Dressing: with modified independence;sit to/from stand;with adaptive equipment Pt Will Transfer to Toilet: Independently;ambulating;regular height toilet Pt Will Perform Tub/Shower Transfer: Tub transfer;with modified independence;ambulating Additional ADL Goal #1: Pt will  independently verbalize three energy conservation techniques  OT Frequency: Min 2X/week   Barriers to D/C:            Co-evaluation              AM-PAC PT "6 Clicks" Daily Activity     Outcome Measure   Help from another person taking care of personal grooming?: A Little Help from another person toileting, which includes using toliet, bedpan, or urinal?: A Little Help from another person bathing (including washing, rinsing, drying)?: A Little Help from another person to put on and taking off regular upper body clothing?: None Help from another person to put on and taking off regular lower body clothing?: A Little 6 Click Score: 16   End of Session Nurse Communication: Mobility status  Activity Tolerance: Patient tolerated treatment well Patient left: in bed;with call bell/phone within reach  OT Visit Diagnosis: Unsteadiness on feet (R26.81);Muscle weakness (generalized) (M62.81)                Time: 9562-1308 OT Time Calculation (min): 29 min Charges:  OT General Charges $OT Visit: 1 Visit OT Evaluation $OT Eval Low Complexity: 1 Low OT Treatments $Self Care/Home Management : 8-22 mins G-Codes: OT G-codes **NOT FOR INPATIENT CLASS** Functional Assessment Tool Used: Clinical judgement Functional Limitation: Self care Self Care Current Status (M5784): At least 1 percent but less than 20 percent impaired, limited or restricted Self Care Goal  Status [phone]): 0 percent impaired, limited or restricted   Caeson Filippi MSOT, OTR/L Acute Rehab Pager: [phone] Office: [phone]  Theodoro Grist Kalik Hoare 06/25/2017, 9:50 AM

## 2017-06-25 NOTE — Discharge Instructions (Signed)
You were admitted for a syncopal episode associated with low blood pressures. You did not have further symptoms while admitted including while walking around and using the restroom. We monitored your blood pressures while admitted and remained low, but seems to be normal for you. Because you have an appointment with your Cardiologist this week, we recommend keeping that appointment and can likely get an ultrasound of your heart at that time. If you begin to have chest pain, trouble breathing, or if this occurs again, come back to the Emergency Department.  You should follow up with your Cardiologist this week. You should also follow up with you primary doctor in 3-4 days.

## 2017-06-25 NOTE — Discharge Summary (Signed)
Family Medicine Teaching [facility] Discharge Summary  Patient name: Margaret Castillo Medical record number: [id number] Date of birth: 1935-03-09 Age: 82 y.o. Gender: female Date of Admission: 06/23/2017  Date of Discharge: 06/25/2017 Admitting Physician: Margaret Castillo  Primary Care Provider: Lindwood QuaHoffman, [place], Castillo Consultants: None  Indication for Hospitalization: Symptomatic hypotension   Discharge Diagnoses/Problem List:  Symptomatic hypotension, stable UTI, improving Chronic macrocytic anemia, stable Elevated glucose, stable  Disposition: Home  Discharge Condition: Improved  Discharge Exam:  General: Pleasant elderly female sitting in NAD  Cardiovascular: RRR. No murmur appreciated but noted previously. Respiratory: CTAB. No wheezing or crackles.  Abdomen: +BS, soft, NTND  Extremities: No LE edema.  Brief Hospital Course:  Margaret Castillo is a 82 y.o. female with a PMH significant for Polymyalgia rheumatica, carotid artery stenosis s/p left endarterectomy in 2008, AAA s/p repair in 2017, internal hemorrhoids with associated rectal bleeding s/p banding, MI in 1982, hypothyroidism, and chronic hypotension presenting with symptomatic hypotension after bowel movement involving lightheadedness and vomiting. Per chart review, patient is consistently hypotensive with SBP in the 80s but is rarely symptomatic per patient. Patient received NS bolus in ED. Negative troponin and EKG with known prolonged PR interval. Cortisol, adrenal, and TSH levels wnl. Hb 11.1, at baseline. Negative orthostatics.  Last ECHO in 2016 notable for mitral regurgitation but largely wnl. Will likely need updated ECHO but can receive outpatient. Of note, patient did endorse urinary frequency on admission with U/A indicative of UTI and was started on 5 day course of Levaquin. During hospitalization, the patient was monitored on telemetry and had no further symptomatic episodes even with ambulation. BPs remained  in the 70-80s systolically but with no further symptoms and negative workup, [name] was stable for discharge with Cardiology and PCP follow up outpatient as well as Home Health PT/OT.  Issues for Follow Up:  1. Patient started on Levaquin for UTI - 5 day total course, will finish on 11/7. Ensure toleration and compliance. 2. Patient will likely need updated ECHO, last one in 2016. Could likely receive this at Cardiology appointment 11/8.  Significant Procedures: None  Significant Labs and Imaging:  Recent Labs  Lab 06/23/17 1228 06/23/17 1806 06/24/17 0559  WBC 9.3 7.0 6.4  HGB 11.1* 10.3* 10.0*  HCT 32.8* 31.6* 30.6*  PLT 240 238 238   Recent Labs  Lab 06/23/17 1228 06/23/17 1806 06/24/17 0559  NA 135  --  138  K 4.0  --  3.8  CL 107  --  108  CO2 22  --  22  GLUCOSE 143*  --  97  BUN 12  --  8  CREATININE 0.76 0.70 0.71  CALCIUM 9.6  --  8.7*  ALKPHOS 47  --   --   AST 25  --   --   ALT 10*  --   --   ALBUMIN 3.8  --   --    EKG 11/2 - sinus rhythm  CXR 11/2 IMPRESSION: 1. No acute intrathoracic process. 2. Stable cardiomegaly and pulmonary vascular congestion.  Results/Tests Pending at Time of Discharge: None  Discharge Medications:  Allergies as of 06/25/2017      Reactions   Atorvastatin Other (See Comments)   MUSCLE PAIN (all statins)   Codeine Nausea Only   Crestor [rosuvastatin] Other (See Comments)   Leg pain/ joint pain  ( ALL THE STATIN's )   Penicillins Rash   Has patient had a PCN reaction causing immediate rash, facial/tongue/throat  swelling, SOB or lightheadedness with hypotension: Yes Has patient had a PCN reaction causing severe rash involving mucus membranes or skin necrosis: No Has patient had a PCN reaction that required hospitalization No Has patient had a PCN reaction occurring within the last 10 years: No If all of the above answers are "NO", then may proceed with Cephalosporin use.   Adhesive [tape] Other (See Comments)    Cephalexin Rash      Medication List    TAKE these medications   acetaminophen 500 MG tablet Commonly known as:  TYLENOL Take 500-1,000 mg by mouth as needed for mild pain.   cyanocobalamin 1000 MCG/ML injection Commonly known as:  (VITAMIN B-12) Inject 1,000 mcg into the muscle every 30 (thirty) days.   ferrous sulfate 325 (65 FE) MG tablet Take 325 mg by mouth daily with breakfast.   levofloxacin 750 MG tablet Commonly known as:  LEVAQUIN Take 1 tablet (750 mg total) daily for 3 days by mouth. Start taking on:  06/26/2017   levothyroxine 100 MCG tablet Commonly known as:  SYNTHROID, LEVOTHROID Take 100 mcg by mouth daily.   meclizine 25 MG tablet Commonly known as:  ANTIVERT Take 12.5 mg by mouth 2 (two) times daily as needed for dizziness.   multivitamin tablet Take 1 tablet by mouth daily.   polyethylene glycol packet Commonly known as:  MIRALAX / GLYCOLAX Take 17 g by mouth daily as needed for mild constipation. What changed:  when to take this   predniSONE 5 MG tablet Commonly known as:  DELTASONE Take 5 mg by mouth every other day. Alternating with 4 mg every other day   tetrahydrozoline 0.05 % ophthalmic solution Place 1 drop into both eyes daily as needed (dry eyes).   triamcinolone cream 0.1 % Commonly known as:  KENALOG Apply 1 application topically as needed.   Vitamin D (Ergocalciferol) 50000 units Caps capsule Commonly known as:  DRISDOL Take 50,000 Units by mouth every 14 (fourteen) days.       Discharge Instructions: Please refer to Patient Instructions section of EMR for full details.  Patient was counseled important signs and symptoms that should prompt return to medical care, changes in medications, dietary instructions, activity restrictions, and follow up appointments.   Follow-Up Appointments: Follow up with Cardiology this week Follow up with PCP in 3-4 days  Margaret Castillo 06/25/2017, 6:18 PM PGY-1, [facility] Family  Medicine

## 2017-06-25 NOTE — Evaluation (Signed)
Physical Therapy Evaluation Patient Details Name: Margaret PickingLouise T Ladson MRN: [id number] DOB: 1935-08-09 Today's Date: 06/25/2017   History of Present Illness  82 y.o. female presenting with symptomatic hypotension after BM. PMH is significant for Polymyalgia rheumatica, carotid artery stenosis s/p left endarterectomy in 2008, AAA s/p repair in 2017, internal hemorrhoids with associated rectal bleeding s/p banding, MI in 1982, hypothyroidism, and chronic hypotension   Clinical Impression  Admitted with above diagnosis; Prior to admission, independent household ambulator (furniture walks); Lives alone. Presents with low BP (likely at baseline); Patient reports LE weakness with activity; Gait was mildly unsteady; Worked on gait with and without rolling walker; At this point, recommend HHPT follow up to aid in discernment in optimal assistive device in home and in community. Will follow while in hospital for skilled PT services to maximize independence and safety with mobility prior to dc.      Follow Up Recommendations (P) Home health PT    Equipment Recommendations  (P) None recommended by PT    Recommendations for Other Services       Precautions / Restrictions Precautions Precautions: Fall Precaution Comments: Patient reports feeling leg weakness with fatigue while having relatively low hyptotension at baseline. Restrictions Weight Bearing Restrictions: No      Mobility  Bed Mobility Overal bed mobility: Modified Independent             General bed mobility comments: Increased time and HOB elevated  Transfers Overall transfer level: Needs assistance Equipment used: Rolling walker (2 wheeled) Transfers: Sit to/from Stand Sit to Stand: Min guard         General transfer comment: Min Guard for safety  Ambulation/Gait Ambulation/Gait assistance: Supervision Ambulation Distance (Feet): 40 Feet Assistive device: Rolling walker (2 wheeled)     Gait velocity interpretation:  at or above normal speed for age/gender General Gait Details: Swaying side to side is increased when walking unassisted. Mildly unsteady; Slower using the walker (likely due to it being her first time using it). Cues to self-monitor for activity tolerance.  Stairs            Wheelchair Mobility    Modified Rankin (Stroke Patients Only)       Balance Overall balance assessment: Needs assistance Sitting-balance support: No upper extremity supported;Feet supported Sitting balance-Leahy Scale: Good     Standing balance support: No upper extremity supported;During functional activity Standing balance-Leahy Scale: Fair Standing balance comment: Pt able to maintain static standing without UE support                             Pertinent Vitals/Pain Pain Assessment: No/denies pain    Home Living Family/patient expects to be discharged to:: Private residence Living Arrangements: Alone Available Help at Discharge: Family;Available 24 hours/day Type of Home: House Home Access: Stairs to enter Entrance Stairs-Rails: Right Entrance Stairs-Number of Steps: 2 Home Layout: One level Home Equipment: Walker - 2 wheels;Shower seat      Prior Function Level of Independence: Independent         Comments: Pt performing ADLs, IADLs, occasional short distance driving, and enjoys baking. 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     Hand Dominance   Dominant Hand: Right    Extremity/Trunk Assessment   Upper Extremity Assessment Upper Extremity Assessment: Overall WFL for tasks assessed    Lower Extremity Assessment Lower Extremity Assessment: Generalized weakness(Pt reports feeling weak in LE after standing/walking)    Cervical / Trunk  Assessment Cervical / Trunk Assessment: Normal  Communication   Communication: HOH  Cognition Arousal/Alertness: Awake/alert Behavior During Therapy: WFL for tasks assessed/performed Overall Cognitive Status: Within Functional  Limits for tasks assessed                                 General Comments: Very sweet      General Comments General comments (skin integrity, edema, etc.):   06/25/17 1104  Orthostatic Lying   BP- Lying (!) 74/52  Pulse- Lying 76  Orthostatic Sitting  BP- Sitting (!) 78/54  Pulse- Sitting 65   Patient did not report any dizziness outside of her normal baseline.     Exercises     Assessment/Plan    PT Assessment (P) Patient needs continued PT services  PT Problem List (P) Decreased strength;Decreased balance;Decreased activity tolerance       PT Treatment Interventions (P) DME instruction;Gait training;Stair training;Functional mobility training;Therapeutic activities;Therapeutic exercise;Balance training;Patient/family education;Manual techniques    PT Goals (Current goals can be found in the Care Plan section)  Acute Rehab PT Goals Patient Stated Goal: (P) Go home PT Goal Formulation: (P) With patient Time For Goal Achievement: (P) 07/09/17 Potential to Achieve Goals: (P) Good    Frequency (P) Min 3X/week   Barriers to discharge        Co-evaluation               AM-PAC PT "6 Clicks" Daily Activity  Outcome Measure Difficulty turning over in bed (including adjusting bedclothes, sheets and blankets)?: (P) None Difficulty moving from lying on back to sitting on the side of the bed? : (P) None Difficulty sitting down on and standing up from a chair with arms (e.g., wheelchair, bedside commode, etc,.)?: (P) A Little Help needed moving to and from a bed to chair (including a wheelchair)?: (P) A Little Help needed walking in hospital room?: (P) None Help needed climbing 3-5 steps with a railing? : (P) A Little 6 Click Score: (P) 21    End of Session Equipment Utilized During Treatment: Gait belt Activity Tolerance: Patient tolerated treatment well(slightly limited by fatigue when standing for long time) Patient left: in bed   PT Visit  Diagnosis: (P) Muscle weakness (generalized) (M62.81);Difficulty in walking, not elsewhere classified (R26.2);Dizziness and giddiness (R42)(Relatively low BP - main fall risk)    Time: 1055-1148(minus 10 minutes) PT Time Calculation (min) (ACUTE ONLY): 53 min   Charges:   PT Evaluation $PT Eval Low Complexity: 1 Low PT Treatments $Gait Training: 8-22 mins $Therapeutic Activity: 8-22 mins   PT G Codes:   PT G-Codes **NOT FOR INPATIENT CLASS** Functional Assessment Tool Used: Clinical judgement Functional Limitation: Mobility: Walking and moving around Mobility: Walking and Moving Around Current Status (R6045): At least 1 percent but less than 20 percent impaired, limited or restricted Mobility: Walking and Moving Around Goal Status [phone]): 0 percent impaired, limited or restricted    Clemon Chambers, [facility]  Acute Rehab Services  Office #[phone]  Clemon Chambers 06/25/2017, 2:59 PM   I was present during the PT session and agree with patient status and findings as outlined by Lorriane Shire, SPT.  Van Clines, [place]  Acute Rehabilitation Services Pager [phone] Office [phone]

## 2017-06-26 LAB — OCCULT BLOOD, POC DEVICE: FECAL OCCULT BLD: NEGATIVE

## 2017-06-29 DIAGNOSIS — I252 Old myocardial infarction: Secondary | ICD-10-CM | POA: Diagnosis not present

## 2017-06-29 DIAGNOSIS — Z8719 Personal history of other diseases of the digestive system: Secondary | ICD-10-CM | POA: Diagnosis not present

## 2017-06-29 DIAGNOSIS — I714 Abdominal aortic aneurysm, without rupture: Secondary | ICD-10-CM | POA: Diagnosis not present

## 2017-06-29 DIAGNOSIS — K625 Hemorrhage of anus and rectum: Secondary | ICD-10-CM | POA: Diagnosis not present

## 2017-06-29 DIAGNOSIS — I959 Hypotension, unspecified: Secondary | ICD-10-CM | POA: Diagnosis not present

## 2017-06-29 DIAGNOSIS — R42 Dizziness and giddiness: Secondary | ICD-10-CM | POA: Diagnosis not present

## 2017-07-18 DIAGNOSIS — E538 Deficiency of other specified B group vitamins: Secondary | ICD-10-CM | POA: Diagnosis not present

## 2017-07-25 ENCOUNTER — Telehealth: Payer: Self-pay | Admitting: Internal Medicine

## 2017-07-25 DIAGNOSIS — K625 Hemorrhage of anus and rectum: Secondary | ICD-10-CM

## 2017-07-25 NOTE — Telephone Encounter (Signed)
We can have her get a CBC soon and I should see her this week or next if possible to consider additional banding

## 2017-07-25 NOTE — Telephone Encounter (Signed)
Left message on machine to call back  

## 2017-07-25 NOTE — Telephone Encounter (Signed)
She had bright red bleeding for a week off/on then this morning had significant bleeding in the toilet.  Since this morning 2 bowel movements with no bleeding.  Her bowels are regular.  Last banding was 04/2017.  No SOB, dizziness, some weakness.  Please advise.

## 2017-07-26 NOTE — Telephone Encounter (Signed)
I spoke with the pt and she states that she has had no further episodes of bleeding.  She declines to make appt with Dr Leone PayorGessner at this time.  She will speak with her son and decide about the appt and labs and have him call back. I have added the lab into EPIC

## 2017-07-28 ENCOUNTER — Other Ambulatory Visit (INDEPENDENT_AMBULATORY_CARE_PROVIDER_SITE_OTHER): Payer: Medicare HMO

## 2017-07-28 ENCOUNTER — Telehealth: Payer: Self-pay | Admitting: Internal Medicine

## 2017-07-28 DIAGNOSIS — K625 Hemorrhage of anus and rectum: Secondary | ICD-10-CM | POA: Diagnosis not present

## 2017-07-28 LAB — CBC WITH DIFFERENTIAL/PLATELET
BASOS ABS: 0.1 10*3/uL (ref 0.0–0.1)
Basophils Relative: 1.1 % (ref 0.0–3.0)
EOS PCT: 0.6 % (ref 0.0–5.0)
Eosinophils Absolute: 0.1 10*3/uL (ref 0.0–0.7)
HCT: 32.2 % — ABNORMAL LOW (ref 36.0–46.0)
HEMOGLOBIN: 10.8 g/dL — AB (ref 12.0–15.0)
LYMPHS ABS: 1.9 10*3/uL (ref 0.7–4.0)
Lymphocytes Relative: 18.5 % (ref 12.0–46.0)
MCHC: 33.5 g/dL (ref 30.0–36.0)
MCV: 105.8 fl — AB (ref 78.0–100.0)
MONO ABS: 0.6 10*3/uL (ref 0.1–1.0)
MONOS PCT: 6.2 % (ref 3.0–12.0)
NEUTROS PCT: 73.6 % (ref 43.0–77.0)
Neutro Abs: 7.4 10*3/uL (ref 1.4–7.7)
Platelets: 268 10*3/uL (ref 150.0–400.0)
RBC: 3.04 Mil/uL — AB (ref 3.87–5.11)
RDW: 17.4 % — ABNORMAL HIGH (ref 11.5–15.5)
WBC: 10 10*3/uL (ref 4.0–10.5)

## 2017-07-28 NOTE — Telephone Encounter (Signed)
Patient will come for ? Banding 08/03/17 3:00.  appt arranged with her son

## 2017-07-28 NOTE — Progress Notes (Signed)
Hgb unchanged My Chart message

## 2017-08-03 ENCOUNTER — Ambulatory Visit: Payer: Medicare HMO | Admitting: Internal Medicine

## 2017-08-03 ENCOUNTER — Encounter: Payer: Self-pay | Admitting: Internal Medicine

## 2017-08-03 VITALS — HR 96 | Ht 63.75 in | Wt 184.0 lb

## 2017-08-03 DIAGNOSIS — R42 Dizziness and giddiness: Secondary | ICD-10-CM

## 2017-08-03 DIAGNOSIS — K642 Third degree hemorrhoids: Secondary | ICD-10-CM | POA: Diagnosis not present

## 2017-08-03 DIAGNOSIS — I950 Idiopathic hypotension: Secondary | ICD-10-CM | POA: Diagnosis not present

## 2017-08-03 MED ORDER — PREDNISONE 5 MG PO TABS: 20.0000 mg | ORAL_TABLET | Freq: Every day | ORAL | 0 refills | Status: DC

## 2017-08-03 NOTE — Progress Notes (Signed)
Margaret Castillo 82 y.o. [birth year] [id number]  Assessment & Plan:   Encounter Diagnoses  Name Primary?  . Prolapsed internal hemorrhoids, grade 3 Yes  . hypotension   . Light headed     The patient's hemorrhoids are probably beyond banding at this point.  We could try that again but with her symptomatic hypotension I am reluctant to do so right now.  I see where she saw cardiology after her hospitalization for symptomatic hypotension, and that doctor thought that she probably had low blood pressures on the basis of diffuse atherosclerosis.  He thought she was not symptomatic but I get a different story.  In fact she was just hospitalized with syncope or presyncope right before that admission.  When she was admitted it was thought that she had a vasovagal in the setting of chronic we low blood pressure.  This may be true but I have seen her before and she is different today than she has been in the past she is much slower weaker and complaining of feeling lightheaded.   I have suggested she take 20 mg of prednisone daily to see if that makes a difference on the off chance that she could have some sort of adrenal insufficiency.  She had a cortisol level that was okay when she was hospitalized so this may not help as her a.m. cortisol level was normal but I think it is worth a brief try to see if it leads to significant improvement.  I do think she had been tapering prednisone slightly in recent months. mitted  Admittedly her problems are not in my area of usual expertise but the history I get is that she is lightheaded and weak with these low blood pressures.  Her mild anemia is stable.  I do not think that is the problem.  I have recommended she get back to her primary care provider as soon as possible.  This is a relatively chronic issue so I do not think it is an emergency but she should not wait till January to be seen in my opinion.  She may need a second opinion from cardiology.  She  asked if she needed to see Dr. Arbie Cookey of Vascular surgery and I do not think so but I will copy him on this note.   I appreciate the opportunity to care for this patient. CC: Lindwood Qua, MD Dr. Tawanna Cooler Early Subjective:   Chief Complaint: Bleeding hemorrhoids  HPI The patient is here for follow-up, I have banded her hemorrhoids in the past, she was improved but in the past week or 2 she has had recurrent bleeding a bit more significantly.  In fact 1 or 2 days it was profuse.  I had her check a hemoglobin and it is overall stable.  CBC Latest Ref Rng & Units 07/28/2017 06/24/2017 06/23/2017  WBC 4.0 - 10.5 K/uL 10.0 6.4 7.0  Hemoglobin 12.0 - 15.0 g/dL 10.8(L) 10.0(L) 10.3(L)  Hematocrit 36.0 - 46.0 % 32.2(L) 30.6(L) 31.6(L)  Platelets 150.0 - 400.0 K/uL 268.0 238 238    She is dizzy and weak today and we had trouble recording a blood pressure on either arm.  She says it has been that way and she was hospitalized in early November for symptomatic hypotension.  Allergies  Allergen Reactions  . Atorvastatin Other (See Comments)    MUSCLE PAIN (all statins)  . Codeine Nausea Only  . Crestor [Rosuvastatin] Other (See Comments)    Leg pain/ joint pain  (  ALL THE STATIN's )  . Penicillins Rash    Has patient had a PCN reaction causing immediate rash, facial/tongue/throat swelling, SOB or lightheadedness with hypotension: Yes Has patient had a PCN reaction causing severe rash involving mucus membranes or skin necrosis: No Has patient had a PCN reaction that required hospitalization No Has patient had a PCN reaction occurring within the last 10 years: No If all of the above answers are "NO", then may proceed with Cephalosporin use.   . Adhesive [Tape] Other (See Comments)  . Cephalexin Rash   Current Meds  Medication Sig  . acetaminophen (TYLENOL) 500 MG tablet Take 500-1,000 mg by mouth as needed for mild pain.   . cyanocobalamin (,VITAMIN B-12,) 1000 MCG/ML injection Inject 1,000 mcg  into the muscle every 30 (thirty) days.   . ferrous sulfate 325 (65 FE) MG tablet Take 325 mg by mouth daily with breakfast.  . levothyroxine (SYNTHROID, LEVOTHROID) 100 MCG tablet Take 100 mcg by mouth daily.   . meclizine (ANTIVERT) 25 MG tablet Take 12.5 mg by mouth 2 (two) times daily as needed for dizziness.   . Multiple Vitamin (MULTIVITAMIN) tablet Take 1 tablet by mouth daily.  . polyethylene glycol (MIRALAX / GLYCOLAX) packet Take 17 g by mouth daily as needed for mild constipation. (Patient taking differently: Take 17 g by mouth daily. )  . predniSONE (DELTASONE) 5 MG tablet Take 5 mg by mouth every other day. Alternating with 4 mg every other day  . tetrahydrozoline 0.05 % ophthalmic solution Place 1 drop into both eyes daily as needed (dry eyes).  . triamcinolone cream (KENALOG) 0.1 % Apply 1 application topically as needed.   . Vitamin D, Ergocalciferol, (DRISDOL) 50000 UNITS CAPS Take 50,000 Units by mouth every 14 (fourteen) days.    Past Medical History:  Diagnosis Date  . AAA (abdominal aortic aneurysm) (HCC)   . Anemia   . Arthritis    Neck  . Bladder prolapse, female, acquired   . Carotid artery occlusion   . Endoleak post endovascular aneurysm repair [facility](HCC), Type I 02/2016   [facility]  . Hearing aid worn   . Hypothyroidism   . Myocardial infarction (HCC) 1982  . Polymyalgia rheumatica (HCC)   . Prolapsed internal hemorrhoids, grade 3   . Shingles   . Uterine prolapse    Past Surgical History:  Procedure Laterality Date  . ABDOMINAL AORTIC ANEURYSM REPAIR  2008   Infrarenal aneurysm stent graft repair  . ABDOMINAL AORTIC ENDOVASCULAR STENT GRAFT N/A 02/29/2016   Procedure: Aortic cuff/Gore;  Surgeon: Larina Earthlyodd F Early, MD;  [facility];  Service: Vascular;  Laterality: N/A;  . APPENDECTOMY    . CAROTID ENDARTERECTOMY  12-07-06   Left CEA  . CARPAL TUNNEL RELEASE Left   . CATARACT EXTRACTION Bilateral   . CHOLECYSTECTOMY      Gall Bladder  . COLONOSCOPY WITH PROPOFOL N/A 09/28/2016   Procedure: COLONOSCOPY WITH PROPOFOL;  Surgeon: Rachael Feeaniel P Jacobs, MD;  [facility];  Service: Endoscopy;  Laterality: N/A;  . HEMORRHOID BANDING    . PERIPHERAL VASCULAR CATHETERIZATION N/A 02/03/2016   Procedure: Abdominal Aortogram;  Surgeon: Pryor OchoaJames D Lawson, MD;  [facility];  Service: Cardiovascular;  Laterality: N/A;  . PERIPHERAL VASCULAR CATHETERIZATION Bilateral 02/03/2016   Procedure: Lower Extremity Angiography;  Surgeon: Pryor OchoaJames D Lawson, MD;  [facility];  Service: Cardiovascular;  Laterality: Bilateral;  . TONSILLECTOMY     Social  History   Social History Narrative   The patient is widowed. She has sons. 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   family history includes Arthritis in her brother; Cancer in her brother; Diabetes in her brother, paternal grandfather, and sister; Heart attack in her father and sister; Heart disease in her brother, brother, and father; Ovarian cancer in her sister; Prostate cancer in her brother and brother; Stroke in her mother and sister.   Review of Systems See HPI  Objective:   Physical Exam Pulse 96   Ht 5' 3.75" (1.619 m)   Wt 184 lb (83.5 kg)   BMI 31.83 kg/m   BP 70-80/50 Ht S1s2 no rmg Radial pulses are present but diminished bilaterally  Female staff present Rectal exam - Gr 3 prolapsed internal hemorrhoids all positions  15 minutes time spent with patient > half in counseling coordination of care

## 2017-08-03 NOTE — Patient Instructions (Addendum)
  Per Dr Leone PayorGessner take 20mg  of your prednisone daily (4 tablets) until you see your PCP.  You need to call your primary care Doctor as soon as possible and make an appointment to see them either tomorrow or early next week for your low blood pressure and light headedness.  If you get worse go to the nearest ED.  We have sent the following medications to your pharmacy for you to pick up at your convenience: Prednisone  I appreciate the opportunity to care for you. Stan Headarl Gessner, MD, [facility]

## 2017-08-04 DIAGNOSIS — I6523 Occlusion and stenosis of bilateral carotid arteries: Secondary | ICD-10-CM | POA: Diagnosis not present

## 2017-08-04 DIAGNOSIS — K642 Third degree hemorrhoids: Secondary | ICD-10-CM | POA: Diagnosis not present

## 2017-08-04 DIAGNOSIS — I9589 Other hypotension: Secondary | ICD-10-CM | POA: Diagnosis not present

## 2017-08-04 DIAGNOSIS — M353 Polymyalgia rheumatica: Secondary | ICD-10-CM | POA: Diagnosis not present

## 2017-11-09 ENCOUNTER — Encounter (HOSPITAL_COMMUNITY): Payer: Medicare HMO

## 2017-11-20 ENCOUNTER — Ambulatory Visit (HOSPITAL_COMMUNITY)
Admission: RE | Admit: 2017-11-20 | Discharge: 2017-11-20 | Disposition: A | Discharge status: Home / Self Care | Payer: Medicare HMO | Source: Ambulatory Visit | Attending: Vascular Surgery | Admitting: Vascular Surgery

## 2017-11-20 DIAGNOSIS — Z48812 Encounter for surgical aftercare following surgery on the circulatory system: Secondary | ICD-10-CM | POA: Diagnosis not present

## 2017-11-21 ENCOUNTER — Other Ambulatory Visit: Payer: Self-pay

## 2017-11-21 ENCOUNTER — Encounter: Payer: Self-pay | Admitting: Vascular Surgery

## 2017-11-21 ENCOUNTER — Ambulatory Visit (INDEPENDENT_AMBULATORY_CARE_PROVIDER_SITE_OTHER): Payer: Medicare HMO | Admitting: Vascular Surgery

## 2017-11-21 VITALS — BP 207/78 | HR 235 | Temp 98.2°F | Resp 20 | Ht 63.0 in | Wt 181.0 lb

## 2017-11-21 DIAGNOSIS — I6523 Occlusion and stenosis of bilateral carotid arteries: Secondary | ICD-10-CM | POA: Diagnosis not present

## 2017-11-21 DIAGNOSIS — I771 Stricture of artery: Secondary | ICD-10-CM | POA: Diagnosis not present

## 2017-11-21 NOTE — Progress Notes (Signed)
[facility]ist of [place]  Patient name: Margaret Castillo Caughlin MRN: [id number] DOB: 12/28/1934 Sex: female  REASON FOR VISIT: Follow-up extracranial cerebrovascular occlusive disease  HPI: Margaret Castillo Greear is a 83 y.o. female here today for follow-up accompanied by her son.  She has had continued failing health with worsening generalized weakness.  She also has had one episode of blacking out awakening on the floor.  Also reports generalized dizziness and associated with her weakness.  She has had no focal neurologic deficits.  She has been admitted to the hospital for a urinary tract infection  Past Medical History:  Diagnosis Date  . AAA (abdominal aortic aneurysm) (HCC)   . Anemia   . Arthritis    Neck  . Bladder prolapse, female, acquired   . Carotid artery occlusion   . Endoleak post endovascular aneurysm repair [facility](HCC), Type I 02/2016   [facility] for aortic cuff placement  . Hearing aid worn   . Hypothyroidism   . Myocardial infarction (HCC) 1982  . Polymyalgia rheumatica (HCC)   . Prolapsed internal hemorrhoids, grade 3   . Shingles   . Uterine prolapse     Family History  Problem Relation Age of Onset  . Heart disease Father        After age 81   . Heart attack Father   . Diabetes Sister   . Ovarian cancer Sister        may be uterine...  . Cancer Brother   . Heart disease Brother   . Diabetes Brother   . Prostate cancer Brother   . Stroke Mother   . Stroke Sister   . Heart disease Brother   . Arthritis Brother   . Prostate cancer Brother   . Heart attack Sister   . Diabetes Paternal Grandfather     SOCIAL HISTORY: Social History   Tobacco Use  . Smoking status: Former Smoker    Last attempt to quit: 08/22/1980    Years since quitting: 37.2  . Smokeless tobacco: Never Used  Substance Use Topics  . Alcohol use: No    Allergies  Allergen Reactions  . Atorvastatin Other (See Comments)    MUSCLE  PAIN (all statins)  . Codeine Nausea Only  . Crestor [Rosuvastatin] Other (See Comments)    Leg pain/ joint pain  ( ALL THE STATIN's )  . Penicillins Rash    Has patient had a PCN reaction causing immediate rash, facial/tongue/throat swelling, SOB or lightheadedness with hypotension: Yes Has patient had a PCN reaction causing severe rash involving mucus membranes or skin necrosis: No Has patient had a PCN reaction that required hospitalization No Has patient had a PCN reaction occurring within the last 10 years: No If all of the above answers are "NO", then may proceed with Cephalosporin use.   . Adhesive [Tape] Other (See Comments)  . Cephalexin Rash    Current Outpatient Medications  Medication Sig Dispense Refill  . acetaminophen (TYLENOL) 500 MG tablet Take 500-1,000 mg by mouth as needed for mild pain.     . cyanocobalamin (,VITAMIN B-12,) 1000 MCG/ML injection Inject 1,000 mcg into the muscle every 30 (thirty) days.     . ferrous sulfate 325 (65 FE) MG tablet Take 325 mg by mouth daily with breakfast.    . levothyroxine (SYNTHROID, LEVOTHROID) 100 MCG tablet Take 100 mcg by mouth daily. Take 6 days and skip 1.    . meclizine (ANTIVERT) 25 MG tablet Take 12.5 mg  by mouth 2 (two) times daily as needed for dizziness.     . Multiple Vitamin (MULTIVITAMIN) tablet Take 1 tablet by mouth daily.    . polyethylene glycol (MIRALAX / GLYCOLAX) packet Take 17 g by mouth daily as needed for mild constipation. (Patient taking differently: Take 17 g by mouth daily. ) 14 each 0  . predniSONE (DELTASONE) 5 MG tablet Take 4 tablets (20 mg total) by mouth daily with breakfast. (Patient taking differently: Take 9 mg by mouth daily with breakfast. ) 56 tablet 0  . tetrahydrozoline 0.05 % ophthalmic solution Place 1 drop into both eyes daily as needed (dry eyes).    . triamcinolone cream (KENALOG) 0.1 % Apply 1 application topically as needed.     . Vitamin D, Ergocalciferol, (DRISDOL) 50000 UNITS CAPS  Take 50,000 Units by mouth every 14 (fourteen) days.      No current facility-administered medications for this visit.     REVIEW OF SYSTEMS:  [X]  denotes positive finding, [ ]  denotes negative finding Cardiac  Comments:  Chest pain or chest pressure:    Shortness of breath upon exertion: x   Short of breath when lying flat:    Irregular heart rhythm:        Vascular    Pain in calf, thigh, or hip brought on by ambulation:    Pain in feet at night that wakes you up from your sleep:     Blood clot in your veins:    Leg swelling:           PHYSICAL EXAM: Vitals:   11/21/17 1426 11/21/17 1442  BP: (!) 210/80 (!) 207/78  Pulse: (!) 235   Resp: 20   Temp: 98.2 F (36.8 C)   TempSrc: Oral   SpO2: (!) 73%   Weight: 181 lb (82.1 kg)   Height: 5\' 3"  (1.6 m)     GENERAL: The patient is a well-nourished female, in no acute distress. The vital signs are documented above. CARDIOVASCULAR: I do not palpate radial pulses bilaterally.  Carotid arteries without bruits bilaterally PULMONARY: There is good air exchange  MUSCULOSKELETAL: There are no major deformities or cyanosis. NEUROLOGIC: No focal weakness or paresthesias are detected. SKIN: There are no ulcers or rashes noted. PSYCHIATRIC: The patient has a normal affect.  DATA:  Carotid duplex is no evidence of internal carotid artery stenosis.  Does have irregular flow in vertebral arteries bilaterally  I did review her prior CT angiogram of her neck from 2017.  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  Also had tight stenosis at the origin of her right vertebral artery  MEDICAL ISSUES: I had a long discussion with the patient and her son present.  Explained a very difficult management situation.  It would be quite difficult to treat her supra aortic trunk disease since she has involvement of both the subclavian on the left and the innominate arteries.  Would require probable  sternotomy for revascularization.  I do not know if she could tolerate this.  She is to see neurology for further evaluation.  If it is felt that she is having posterior circulation symptoms she would require arteriography for further planning.  I will see her again in 1 month after she is been further evaluated    Larina Earthly, MD [facility] [phone] Pager [phone]

## 2018-01-02 ENCOUNTER — Ambulatory Visit: Payer: Medicare HMO | Admitting: Vascular Surgery

## 2018-01-09 ENCOUNTER — Ambulatory Visit: Payer: Medicare HMO | Admitting: Vascular Surgery

## 2018-01-09 ENCOUNTER — Encounter (HOSPITAL_COMMUNITY): Payer: Medicare HMO

## 2018-01-18 ENCOUNTER — Telehealth: Payer: Self-pay | Admitting: Vascular Surgery

## 2018-01-18 NOTE — Telephone Encounter (Signed)
Yes. Please cancel the studies. Office visit only   Get Outlook for iOS  From: Warrick Parisian Sent: Monday, Jan 08, 2018 1:18:47 PM To: Gretta Began Subject: CTA f/u    Hi Dr. Arbie Cookey,  The pt Margaret Castillo (MRN [id number]) is coming back 02/2018 for a carotid and CTA ordered last year 2018. You saw them 11/21/17 and just wanted a follow up. Should we cancel the CTA and carotid duplex for 02/2018?  Thank you, Elon Jester

## 2018-01-21 IMAGING — CT CT CTA ABD/PEL W/CM AND/OR W/O CM
1 of 7 series · 1 of 46 positions shown, 3 images · IV contrast (Iodine)
Comparison: 01/20/2016

ADDENDUM:
Abdomen pelvis CTA was reviewed and compared to all prior studies
dating back to 09/04/2009. The described endoleak is similar to the
prior exams dating back to 0822 and appears to be secondary to
adjacent small posterior lumbar arteries. This would be compatible
with a type 2 endoleak rather than a type 3 endoleak. The native
aneurysm sac has enlarged since 0822 from 4.1 cm to 5.8 cm.

These results were called by telephone at the time of interpretation
on 01/24/2016 at [DATE] to Dr. Jumper, who verbally acknowledged
these results.
CLINICAL DATA: Worsening left flank pain. Abdominal aortic aneurysm
stent graft endoleak. Left ureteral calculus.
EXAM:
CTA ABDOMEN AND PELVIS WITH CONTRAST
TECHNIQUE: Multidetector CT imaging of the abdomen and pelvis was performed
using the standard protocol during bolus administration of
intravenous contrast. Multiplanar reconstructed images and MIPs were
obtained and reviewed to evaluate the vascular anatomy.
CONTRAST:  80 mL Isovue 370

[Series 200: locator · axial · 0.33mm/px · z∈[-70,-70]mm · 1 of 1 slices shown, 3 images]
[im 1/1  soft-tissue]
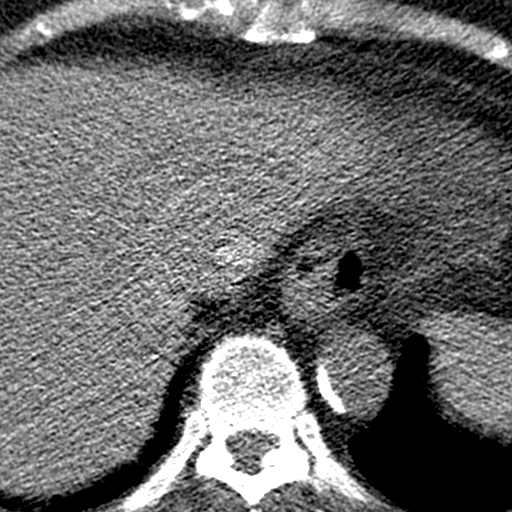
[im 1/1  lung]
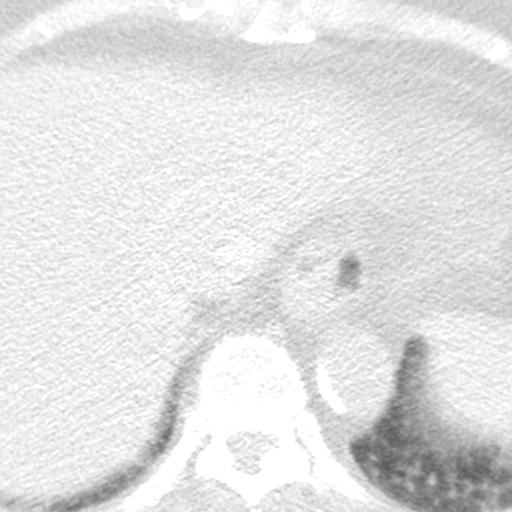
[im 1/1  bone]
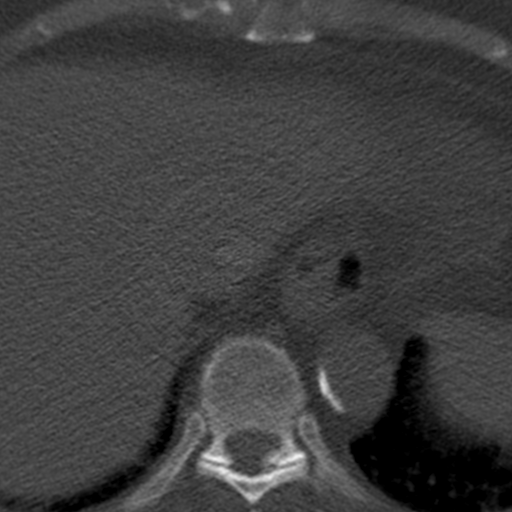

[1 of 46 positions shown; findings below may reference images not displayed]

FINDINGS: Vascular:

Endovascular stent graft repair of abdominal aortic aneurysm is
again seen. Endovascular stent graft is widely patent, with normal
runoff into the iliac arteries bilaterally.

There is a persistent endoleak arising from the posterior wall of
the endograft just above the iliac limbs, with contrast pooling in
the posterior portion of the native aneurysm sac. This is unchanged
in appearance since previous study. Native abdominal aortic aneurysm
measures 5.8 x 5.1 cm and is stable in size compared with prior
study. No evidence of retroperitoneal hemorrhage.

Nonvascular:

Lower Chest: No acute findings.

Hepatobiliary: No masses or other significant abnormality
identified. Prior cholecystectomy noted. No evidence of biliary
dilatation.

Pancreas: No mass, inflammatory changes, or other significant
abnormality identified.

Spleen:  Within normal limits in size and appearance.

Adrenals/Urinary Tract: Normal adrenal glands. A 3 mm nonobstructive
calculus is noted in lower pole of left kidney. Moderate left
hydronephrosis and ureterectasis is increased since previous study.
Ureteral dilatation is seen to the level of the left ureterovesical
junction where there is an obstructing 6 mm calculus.

Stomach/Bowel/Peritoneum: No evidence of wall thickening or
obstruction. No evidence of inflammatory process or abnormal fluid
collections. Diverticulosis is again seen involving the sigmoid
colon, however there is no evidence of diverticulitis. Small hiatal
hernia again noted.

Lymphatic:  No pathologically enlarged lymph nodes identified.

Reproductive:  No mass or other significant abnormality identified.

Other:  None.

Musculoskeletal: No suspicious bone lesions identified. Old fracture
deformity of left pubis again noted.

Review of the MIP images confirms the above findings.
IMPRESSION: Increased moderate left hydroureteronephrosis, due to 6 mm
obstructing calculus at the left ureterovesical junction.

Unchanged appearance of endoleak (suspect type 3) from posterior
wall of abdominal aortic endograft compared to recent CTA. Stable
size of native abdominal aortic aneurysm measuring 5.8 cm in maximum
diameter.

## 2018-02-13 ENCOUNTER — Ambulatory Visit: Payer: Medicare HMO | Admitting: Vascular Surgery

## 2018-02-27 ENCOUNTER — Encounter (HOSPITAL_COMMUNITY): Payer: Medicare HMO

## 2018-02-27 ENCOUNTER — Ambulatory Visit: Payer: Medicare HMO | Admitting: Vascular Surgery

## 2018-03-22 ENCOUNTER — Encounter: Payer: Self-pay | Admitting: *Deleted

## 2018-03-22 ENCOUNTER — Other Ambulatory Visit: Payer: Self-pay

## 2018-03-22 ENCOUNTER — Inpatient Hospital Stay
Admission: EM | Admit: 2018-03-22 | Discharge: 2018-03-26 | DRG: 394 | Disposition: A | Discharge status: Home Health | Payer: Medicare HMO | Source: Skilled Nursing Facility | Attending: Internal Medicine | Admitting: Internal Medicine

## 2018-03-22 DIAGNOSIS — Z794 Long term (current) use of insulin: Secondary | ICD-10-CM

## 2018-03-22 DIAGNOSIS — I252 Old myocardial infarction: Secondary | ICD-10-CM | POA: Diagnosis not present

## 2018-03-22 DIAGNOSIS — E119 Type 2 diabetes mellitus without complications: Secondary | ICD-10-CM | POA: Diagnosis present

## 2018-03-22 DIAGNOSIS — H9190 Unspecified hearing loss, unspecified ear: Secondary | ICD-10-CM | POA: Diagnosis present

## 2018-03-22 DIAGNOSIS — Z79890 Hormone replacement therapy (postmenopausal): Secondary | ICD-10-CM | POA: Diagnosis not present

## 2018-03-22 DIAGNOSIS — I10 Essential (primary) hypertension: Secondary | ICD-10-CM | POA: Diagnosis present

## 2018-03-22 DIAGNOSIS — Z87891 Personal history of nicotine dependence: Secondary | ICD-10-CM

## 2018-03-22 DIAGNOSIS — E785 Hyperlipidemia, unspecified: Secondary | ICD-10-CM | POA: Diagnosis present

## 2018-03-22 DIAGNOSIS — M353 Polymyalgia rheumatica: Secondary | ICD-10-CM | POA: Diagnosis present

## 2018-03-22 DIAGNOSIS — Z8041 Family history of malignant neoplasm of ovary: Secondary | ICD-10-CM | POA: Diagnosis not present

## 2018-03-22 DIAGNOSIS — Z833 Family history of diabetes mellitus: Secondary | ICD-10-CM

## 2018-03-22 DIAGNOSIS — K219 Gastro-esophageal reflux disease without esophagitis: Secondary | ICD-10-CM | POA: Diagnosis present

## 2018-03-22 DIAGNOSIS — D649 Anemia, unspecified: Secondary | ICD-10-CM

## 2018-03-22 DIAGNOSIS — K644 Residual hemorrhoidal skin tags: Secondary | ICD-10-CM | POA: Diagnosis present

## 2018-03-22 DIAGNOSIS — D62 Acute posthemorrhagic anemia: Secondary | ICD-10-CM | POA: Diagnosis present

## 2018-03-22 DIAGNOSIS — Z823 Family history of stroke: Secondary | ICD-10-CM | POA: Diagnosis not present

## 2018-03-22 DIAGNOSIS — D638 Anemia in other chronic diseases classified elsewhere: Secondary | ICD-10-CM | POA: Diagnosis present

## 2018-03-22 DIAGNOSIS — K625 Hemorrhage of anus and rectum: Secondary | ICD-10-CM

## 2018-03-22 DIAGNOSIS — E039 Hypothyroidism, unspecified: Secondary | ICD-10-CM | POA: Diagnosis present

## 2018-03-22 DIAGNOSIS — Z7952 Long term (current) use of systemic steroids: Secondary | ICD-10-CM | POA: Diagnosis not present

## 2018-03-22 DIAGNOSIS — K648 Other hemorrhoids: Secondary | ICD-10-CM | POA: Diagnosis present

## 2018-03-22 DIAGNOSIS — Z880 Allergy status to penicillin: Secondary | ICD-10-CM

## 2018-03-22 DIAGNOSIS — I2510 Atherosclerotic heart disease of native coronary artery without angina pectoris: Secondary | ICD-10-CM | POA: Diagnosis present

## 2018-03-22 DIAGNOSIS — F329 Major depressive disorder, single episode, unspecified: Secondary | ICD-10-CM | POA: Diagnosis present

## 2018-03-22 DIAGNOSIS — K922 Gastrointestinal hemorrhage, unspecified: Secondary | ICD-10-CM | POA: Diagnosis present

## 2018-03-22 DIAGNOSIS — K921 Melena: Secondary | ICD-10-CM | POA: Diagnosis present

## 2018-03-22 LAB — URINALYSIS, COMPLETE (UACMP) WITH MICROSCOPIC
Bacteria, UA: NONE SEEN
Bilirubin Urine: NEGATIVE
Hgb urine dipstick: NEGATIVE
Ketones, ur: 5 mg/dL — AB
Leukocytes, UA: NEGATIVE
Nitrite: NEGATIVE
PROTEIN: NEGATIVE mg/dL
Specific Gravity, Urine: 1.008 (ref 1.005–1.030)
Squamous Epithelial / LPF: NONE SEEN (ref 0–5)
pH: 5 (ref 5.0–8.0)

## 2018-03-22 LAB — COMPREHENSIVE METABOLIC PANEL
ALK PHOS: 35 U/L — AB (ref 38–126)
ALT: 12 U/L (ref 0–44)
AST: 35 U/L (ref 15–41)
Albumin: 3.2 g/dL — ABNORMAL LOW (ref 3.5–5.0)
Anion gap: 10 (ref 5–15)
BILIRUBIN TOTAL: 0.7 mg/dL (ref 0.3–1.2)
BUN: 14 mg/dL (ref 8–23)
CALCIUM: 8.1 mg/dL — AB (ref 8.9–10.3)
CO2: 22 mmol/L (ref 22–32)
CREATININE: 0.97 mg/dL (ref 0.44–1.00)
Chloride: 109 mmol/L (ref 98–111)
GFR calc non Af Amer: 54 mL/min — ABNORMAL LOW (ref >60)
Glucose, Bld: 196 mg/dL — ABNORMAL HIGH (ref 70–99)
Potassium: 3.6 mmol/L (ref 3.5–5.1)
SODIUM: 141 mmol/L (ref 135–145)
Total Protein: 5.5 g/dL — ABNORMAL LOW (ref 6.5–8.1)

## 2018-03-22 LAB — CBC WITH DIFFERENTIAL/PLATELET
BAND NEUTROPHILS: 4 %
BASOS PCT: 0 %
BLASTS: 0 %
Basophils Absolute: 0 10*3/uL (ref 0–0.1)
Eosinophils Absolute: 0 10*3/uL (ref 0–0.7)
Eosinophils Relative: 0 %
HEMATOCRIT: 20.9 % — AB (ref 35.0–47.0)
HEMOGLOBIN: 7 g/dL — AB (ref 12.0–16.0)
LYMPHS PCT: 4 %
Lymphs Abs: 0.3 10*3/uL — ABNORMAL LOW (ref 1.0–3.6)
MCH: 35.5 pg — AB (ref 26.0–34.0)
MCHC: 33.3 g/dL (ref 32.0–36.0)
MCV: 106.7 fL — AB (ref 80.0–100.0)
METAMYELOCYTES PCT: 0 %
MONOS PCT: 2 %
Monocytes Absolute: 0.1 10*3/uL — ABNORMAL LOW (ref 0.2–0.9)
Myelocytes: 0 %
Neutro Abs: 6.9 10*3/uL — ABNORMAL HIGH (ref 1.4–6.5)
Neutrophils Relative %: 90 %
Other: 0 %
Platelets: 218 10*3/uL (ref 150–440)
Promyelocytes Relative: 0 %
RBC: 1.96 MIL/uL — ABNORMAL LOW (ref 3.80–5.20)
RDW: 19 % — ABNORMAL HIGH (ref 11.5–14.5)
WBC: 7.3 10*3/uL (ref 3.6–11.0)
nRBC: 4 /100 WBC — ABNORMAL HIGH

## 2018-03-22 LAB — PROTIME-INR
INR: 1.04
Prothrombin Time: 13.5 seconds (ref 11.4–15.2)

## 2018-03-22 LAB — ABO/RH: ABO/RH(D): A POS

## 2018-03-22 LAB — TROPONIN I: Troponin I: <0.03 ng/mL (ref <0.03)

## 2018-03-22 LAB — PREPARE RBC (CROSSMATCH)

## 2018-03-22 MED ORDER — ADULT MULTIVITAMIN W/MINERALS CH
1.0000 | ORAL_TABLET | Freq: Every day | ORAL | Status: DC
  Administered 2018-03-23 – 2018-03-26 (×4): 1 via ORAL
  Filled 2018-03-22 (×4): qty 1

## 2018-03-22 MED ORDER — BISACODYL 5 MG PO TBEC
5.0000 mg | DELAYED_RELEASE_TABLET | Freq: Every day | ORAL | Status: DC | PRN
  Administered 2018-03-26: 5 mg via ORAL
  Filled 2018-03-22: qty 1

## 2018-03-22 MED ORDER — ROSUVASTATIN CALCIUM 10 MG PO TABS
5.0000 mg | ORAL_TABLET | Freq: Every day | ORAL | Status: DC
  Administered 2018-03-22 – 2018-03-25 (×4): 5 mg via ORAL
  Filled 2018-03-22 (×4): qty 1

## 2018-03-22 MED ORDER — ONDANSETRON HCL 4 MG/2ML IJ SOLN: 4.0000 mg | Freq: Four times a day (QID) | INTRAMUSCULAR | Status: DC | PRN

## 2018-03-22 MED ORDER — FERROUS SULFATE 325 (65 FE) MG PO TABS
325.0000 mg | ORAL_TABLET | Freq: Every day | ORAL | Status: DC
  Administered 2018-03-23 – 2018-03-26 (×4): 325 mg via ORAL
  Filled 2018-03-22 (×4): qty 1

## 2018-03-22 MED ORDER — SODIUM CHLORIDE 0.9 % IV SOLN
INTRAVENOUS | Status: DC
  Administered 2018-03-22 – 2018-03-24 (×3): via INTRAVENOUS

## 2018-03-22 MED ORDER — HYDROCODONE-ACETAMINOPHEN 5-325 MG PO TABS: 1.0000 | ORAL_TABLET | ORAL | Status: DC | PRN

## 2018-03-22 MED ORDER — ONDANSETRON HCL 4 MG PO TABS: 4.0000 mg | ORAL_TABLET | Freq: Four times a day (QID) | ORAL | Status: DC | PRN

## 2018-03-22 MED ORDER — PREDNISONE 20 MG PO TABS
30.0000 mg | ORAL_TABLET | Freq: Every day | ORAL | Status: DC
  Administered 2018-03-23 – 2018-03-26 (×4): 30 mg via ORAL
  Filled 2018-03-22 (×4): qty 2

## 2018-03-22 MED ORDER — LEVOTHYROXINE SODIUM 100 MCG PO TABS
100.0000 ug | ORAL_TABLET | ORAL | Status: DC
  Administered 2018-03-23: 100 ug via ORAL
  Filled 2018-03-22: qty 1

## 2018-03-22 MED ORDER — TRAZODONE HCL 50 MG PO TABS: 25.0000 mg | ORAL_TABLET | Freq: Every evening | ORAL | Status: DC | PRN

## 2018-03-22 MED ORDER — DULOXETINE HCL 20 MG PO CPEP
20.0000 mg | ORAL_CAPSULE | Freq: Every day | ORAL | Status: DC
  Administered 2018-03-23 – 2018-03-26 (×4): 20 mg via ORAL
  Filled 2018-03-22 (×4): qty 1

## 2018-03-22 MED ORDER — HEPARIN SODIUM (PORCINE) 5000 UNIT/ML IJ SOLN: 5000.0000 [IU] | Freq: Three times a day (TID) | INTRAMUSCULAR | Status: DC

## 2018-03-22 MED ORDER — ACETAMINOPHEN 650 MG RE SUPP: 650.0000 mg | Freq: Four times a day (QID) | RECTAL | Status: DC | PRN

## 2018-03-22 MED ORDER — PANTOPRAZOLE SODIUM 40 MG PO TBEC
40.0000 mg | DELAYED_RELEASE_TABLET | Freq: Every day | ORAL | Status: DC
  Administered 2018-03-23 – 2018-03-26 (×4): 40 mg via ORAL
  Filled 2018-03-22 (×4): qty 1

## 2018-03-22 MED ORDER — SODIUM CHLORIDE 0.9 % IV SOLN
10.0000 mL/h | Freq: Once | INTRAVENOUS | Status: AC
Start: 2018-03-22 — End: 2018-03-22
  Administered 2018-03-22: 10 mL/h via INTRAVENOUS

## 2018-03-22 MED ORDER — ACETAMINOPHEN 325 MG PO TABS
650.0000 mg | ORAL_TABLET | Freq: Four times a day (QID) | ORAL | Status: DC | PRN
  Administered 2018-03-23 – 2018-03-26 (×5): 650 mg via ORAL
  Filled 2018-03-22 (×5): qty 2

## 2018-03-22 MED ORDER — DOCUSATE SODIUM 100 MG PO CAPS
100.0000 mg | ORAL_CAPSULE | Freq: Two times a day (BID) | ORAL | Status: DC
  Administered 2018-03-22 – 2018-03-26 (×6): 100 mg via ORAL
  Filled 2018-03-22 (×8): qty 1

## 2018-03-22 NOTE — ED Triage Notes (Signed)
Pt to ED from [facility] due to Hgb of 6.5. PT reporting bright red rectal bleeding and hx of Hemorids. Pt denies symptoms at this time. Talking to MD without difficulty at this time. Pt is pale

## 2018-03-22 NOTE — H&P (Addendum)
[facility] - [place] at [facility]   PATIENT NAME: Margaret Castillo    MR#:  [id number]  DATE OF BIRTH:  05-17-1935  DATE OF ADMISSION:  03/22/2018  PRIMARY CARE PHYSICIAN: [name], MD   REQUESTING/REFERRING PHYSICIAN:   CHIEF COMPLAINT:   Chief Complaint  Patient presents with  . Rectal Bleeding  . Anemia    HISTORY OF PRESENT ILLNESS: Margaret Castillo  is a 83 y.o. female, nursing home resident, with a known history of AAA, anemia of chronic disease, PR, CAD, hemorrhoids and other comorbidities. Patient was transferred from [facility] facility due to low hemoglobin level for blood test done there.  Patient complains of episodes of rectal bleeding with bright red blood per rectum going on for the past 24 hours.  She denies any abdominal pain, but she complains of generalized weakness and fatigue.  Patient recalls she had a colonoscopy approximately a year ago and she was told she had hemorrhoids. Blood test done emergency room are remarkable for hemoglobin level at 7.  The reminder of CBC and CMP are unremarkable. Patient is admitted for further evaluation and treatment.  PAST MEDICAL HISTORY:   Past Medical History:  Diagnosis Date  . AAA (abdominal aortic aneurysm) (HCC)   . Anemia   . Arthritis    Neck  . Bladder prolapse, female, acquired   . Carotid artery occlusion   . Endoleak post endovascular aneurysm repair [facility]), Type I 02/2016   [facility] for aortic cuff placement  . Hearing aid worn   . Hypothyroidism   . Myocardial infarction (HCC) 1982  . Polymyalgia rheumatica (HCC)   . Prolapsed internal hemorrhoids, grade 3   . Shingles   . Uterine prolapse     PAST SURGICAL HISTORY:  Past Surgical History:  Procedure Laterality Date  . ABDOMINAL AORTIC ANEURYSM REPAIR  2008   Infrarenal aneurysm stent graft repair  . ABDOMINAL AORTIC ENDOVASCULAR STENT GRAFT N/A 02/29/2016   Procedure: Aortic cuff/Gore;  Surgeon:  Larina Earthly, MD;  Location[facility] OR;  Service: Vascular;  Laterality: N/A;  . APPENDECTOMY    . CAROTID ENDARTERECTOMY  12-07-06   Left CEA  . CARPAL TUNNEL RELEASE Left   . CATARACT EXTRACTION Bilateral   . CHOLECYSTECTOMY     Gall Bladder  . COLONOSCOPY WITH PROPOFOL N/A 09/28/2016   Procedure: COLONOSCOPY WITH PROPOFOL;  Surgeon: Rachael Fee, MD;  Location: [facility] ENDOSCOPY;  Service: Endoscopy;  Laterality: N/A;  . HEMORRHOID BANDING    . PERIPHERAL VASCULAR CATHETERIZATION N/A 02/03/2016   Procedure: Abdominal Aortogram;  Surgeon: Pryor Ochoa, MD;  Location: [facility] INVASIVE CV LAB;  Service: Cardiovascular;  Laterality: N/A;  . PERIPHERAL VASCULAR CATHETERIZATION Bilateral 02/03/2016   Procedure: Lower Extremity Angiography;  Surgeon: Pryor Ochoa, MD;  Location: [facility] INVASIVE CV LAB;  Service: Cardiovascular;  Laterality: Bilateral;  . TONSILLECTOMY      SOCIAL HISTORY:  Social History   Tobacco Use  . Smoking status: Former Smoker    Last attempt to quit: 08/22/1980    Years since quitting: 37.6  . Smokeless tobacco: Never Used  Substance Use Topics  . Alcohol use: No    FAMILY HISTORY:  Family History  Problem Relation Age of Onset  . Heart disease Father        After age 26   . Heart attack Father   . Diabetes Sister   . Ovarian cancer Sister  may be uterine...  . Cancer Brother   . Heart disease Brother   . Diabetes Brother   . Prostate cancer Brother   . Stroke Mother   . Stroke Sister   . Heart disease Brother   . Arthritis Brother   . Prostate cancer Brother   . Heart attack Sister   . Diabetes Paternal Grandfather     DRUG ALLERGIES:  Allergies  Allergen Reactions  . Atorvastatin Other (See Comments)    MUSCLE PAIN (all statins)  . Codeine Nausea Only  . Crestor [Rosuvastatin] Other (See Comments)    Leg pain/ joint pain  ( ALL THE STATIN's )  . Penicillins Rash    Has patient had a PCN reaction causing immediate rash, facial/tongue/throat  swelling, SOB or lightheadedness with hypotension: Yes Has patient had a PCN reaction causing severe rash involving mucus membranes or skin necrosis: No Has patient had a PCN reaction that required hospitalization No Has patient had a PCN reaction occurring within the last 10 years: No If all of the above answers are "NO", then may proceed with Cephalosporin use.   . Adhesive [Tape] Other (See Comments)  . Cephalexin Rash    REVIEW OF SYSTEMS:   CONSTITUTIONAL: No fever, but patient complains of fatigue and general weakness.  EYES: No blurred or double vision.  EARS, NOSE, AND THROAT: No tinnitus or ear pain.  RESPIRATORY: No cough, shortness of breath, wheezing or hemoptysis.  CARDIOVASCULAR: No chest pain, orthopnea, edema.  GASTROINTESTINAL: No nausea, vomiting, diarrhea or abdominal pain.  GENITOURINARY: No dysuria, hematuria.  ENDOCRINE: No polyuria, nocturia,  HEMATOLOGY: Positive for rectal bleeding SKIN: No rash or lesion. MUSCULOSKELETAL: No joint pain at this time.   NEUROLOGIC: No focal weakness.  PSYCHIATRY: Positive history of anxiety and depression.   MEDICATIONS AT HOME:  Prior to Admission medications   Medication Sig Start Date End Date Taking? Authorizing Provider  DULoxetine (CYMBALTA) 20 MG capsule Take 20 mg by mouth daily.   Yes [provider]  ferrous sulfate 325 (65 FE) MG tablet Take 325 mg by mouth daily with breakfast.   Yes [provider]  insulin NPH Human (HUMULIN N,NOVOLIN N) 100 UNIT/ML injection Inject 6 Units into the skin 2 (two) times daily before a meal.   Yes [provider]  levothyroxine (SYNTHROID, LEVOTHROID) 100 MCG tablet Take 100 mcg by mouth daily. DO NOT TAKE ON Monday AND WEDNESDAY   Yes [provider]  Multiple Vitamin (MULTIVITAMIN) tablet Take 1 tablet by mouth daily.   Yes [provider]  pantoprazole (PROTONIX) 40 MG tablet Take 40 mg by mouth daily.   Yes [provider]   polyethylene glycol (MIRALAX / GLYCOLAX) packet Take 17 g by mouth daily as needed for mild constipation. Patient taking differently: Take 17 g by mouth 2 (two) times daily.  09/29/16  Yes Casey BurkittFitzgerald, Hillary Moen, MD  predniSONE (DELTASONE) 5 MG tablet Take 4 tablets (20 mg total) by mouth daily with breakfast. Patient taking differently: Take 30 mg by mouth daily with breakfast. 03/13/18-03/23/18 08/03/17  Yes Iva BoopGessner, Carl E, MD  rosuvastatin (CRESTOR) 5 MG tablet Take 5 mg by mouth at bedtime.   Yes [provider]  acetaminophen (TYLENOL) 500 MG tablet Take 500-1,000 mg by mouth as needed for mild pain.     [provider]  Vitamin D, Ergocalciferol, (DRISDOL) 50000 UNITS CAPS Take 50,000 Units by mouth every 14 (fourteen) days.     [provider]  PHYSICAL EXAMINATION:   VITAL SIGNS: Blood pressure (!) 183/70, pulse 71, temperature 98 F (36.7 C), temperature source Oral, resp. rate 20, height 5\' 5"  (1.651 m), weight 88.6 kg (195 lb 5.2 oz), SpO2 100 %.  GENERAL:  83 y.o.-year-old patient lying in the bed with no acute distress.  Patient looks pale. EYES: Pupils equal, round, reactive to light and accommodation. No scleral icterus. Extraocular muscles intact.  HEENT: Head atraumatic, normocephalic. Oropharynx and nasopharynx clear.  NECK:  Supple, no jugular venous distention. No thyroid enlargement, no tenderness.  LUNGS: Normal breath sounds bilaterally, no wheezing, rales,rhonchi or crepitation. No use of accessory muscles of respiration.  CARDIOVASCULAR: S1, S2 normal. No S3/S4.  ABDOMEN: Soft, nontender, nondistended. Bowel sounds present. No organomegaly or mass.  EXTREMITIES: No pedal edema, cyanosis, or clubbing.  NEUROLOGIC: No focal weakness. PSYCHIATRIC: The patient is alert and oriented x 3.  SKIN: No obvious rash, lesion, or ulcer.   LABORATORY PANEL:   CBC Recent Labs  Lab 03/22/18 1549  WBC 7.3  HGB 7.0*  HCT 20.9*  PLT 218   MCV 106.7*  MCH 35.5*  MCHC 33.3  RDW 19.0*  LYMPHSABS 0.3*  MONOABS 0.1*  EOSABS 0.0  BASOSABS 0.0   ------------------------------------------------------------------------------------------------------------------  Chemistries  Recent Labs  Lab 03/22/18 1549  NA 141  K 3.6  CL 109  CO2 22  GLUCOSE 196*  BUN 14  CREATININE 0.97  CALCIUM 8.1*  AST 35  ALT 12  ALKPHOS 35*  BILITOT 0.7   ------------------------------------------------------------------------------------------------------------------ estimated creatinine clearance is 50.8 mL/min (by C-G formula based on SCr of 0.97 mg/dL). ------------------------------------------------------------------------------------------------------------------ No results for input(s): TSH, T4TOTAL, T3FREE, THYROIDAB in the last 72 hours.  Invalid input(s): FREET3   Coagulation profile Recent Labs  Lab 03/22/18 1549  INR 1.04   ------------------------------------------------------------------------------------------------------------------- No results for input(s): DDIMER in the last 72 hours. -------------------------------------------------------------------------------------------------------------------  Cardiac Enzymes Recent Labs  Lab 03/22/18 1549  TROPONINI <0.03   ------------------------------------------------------------------------------------------------------------------ Invalid input(s): POCBNP  ---------------------------------------------------------------------------------------------------------------  Urinalysis    Component Value Date/Time   COLORURINE YELLOW (A) 03/22/2018 1549   APPEARANCEUR CLEAR (A) 03/22/2018 1549   LABSPEC 1.008 03/22/2018 1549   PHURINE 5.0 03/22/2018 1549   GLUCOSEU >=500 (A) 03/22/2018 1549   HGBUR NEGATIVE 03/22/2018 1549   BILIRUBINUR NEGATIVE 03/22/2018 1549   KETONESUR 5 (A) 03/22/2018 1549   PROTEINUR NEGATIVE 03/22/2018 1549   NITRITE NEGATIVE  03/22/2018 1549   LEUKOCYTESUR NEGATIVE 03/22/2018 1549     RADIOLOGY: No results found.  EKG: Orders placed or performed during the hospital encounter of 06/23/17  . ED EKG  . ED EKG  . EKG    IMPRESSION AND PLAN:  1.  Lower GI bleed.  Hemoglobin level is low at 7 from 10.8, few months ago.  Will transfuse 1 unit packed RBC.  Continue to monitor clinically closely and monitor hemoglobin level daily.  Will avoid any anticoagulants.  Gastroenterology is consulted for further evaluation and treatment.   2.  Severe anemia, acute on chronic, likely anemia of chronic diseases worsened by acute GI bleed.  Will transfuse 1 unit packed RBC.  Continue to monitor clinically closely and monitor hemoglobin level daily. 3.  Hyperlipidemia, on statin. 4.  Hypothyroid, continue supplement. 5.  PR, prednisone.  All the records are reviewed and case discussed with ED provider. Management plans discussed with the patient, family and they are in agreement.  CODE STATUS: Full    Code Status Orders  (From admission, onward)  Start     Ordered   03/22/18 2304  Full code  Continuous     03/22/18 2303    Code Status History    Date Active Date Inactive Code Status Order ID Comments User Context   06/23/2017 1757 06/25/2017 1806 Full Code [id number]  Casey Burkitt, MD Inpatient   09/26/2016 1649 09/29/2016 1822 Full Code [id number]  Tillman Sers, DO Inpatient   02/29/2016 1244 03/01/2016 1324 Full Code [id number]  Danelle Berry Inpatient   02/03/2016 2236 02/04/2016 1406 Full Code [id number]  Pryor Ochoa, MD Inpatient    Advance Directive Documentation     Most Recent Value  Type of Advance Directive  Healthcare Power of Attorney, Living will  Pre-existing out of facility DNR order (yellow form or pink MOST form)  -  "MOST" Form in Place?  -       TOTAL TIME TAKING CARE OF THIS PATIENT: 45 minutes.    Cammy Copa M.D on 03/23/2018 at 2:37 AM  Between 7am to 6pm -  Pager - [phone]  After 6pm go to [email or web address] - password [email or web address] [facility]  [place] [facility]  Office  [phone]  CC: Primary care physician; [name], MD

## 2018-03-22 NOTE — ED Notes (Signed)
Pt assisted to use bedpan. Food tray and cup of water given.

## 2018-03-22 NOTE — ED Provider Notes (Signed)
[facility] was [facility] by Dr. Jonn ShinglesMarucci but is on a waiting list, there are 10 patients ahead of her.  Recommendation was to admit here.   Emily FilbertWilliams, [name] E, MD 03/22/18 [phone]

## 2018-03-22 NOTE — ED Provider Notes (Addendum)
[facility] Emergency Department Provider Note       Time seen: ----------------------------------------- 3:44 PM on 03/22/2018 -----------------------------------------   I have reviewed the triage vital signs and the nursing notes.  HISTORY   Chief Complaint Rectal Bleeding and Anemia    HPI Margaret Castillo is a 83 y.o. female with a history of AAA, anemia, arthritis, carotid artery stenosis, endovascular aneurysm repair, MI, hemorrhoids who presents to the ED for anemia.  [facility]  Patient had outpatient lab work done which revealed a hemoglobin of 6.5 and she was sent to the ER for blood transfusion.  She has reported passing bright red blood per rectum and has a history of hemorrhoids.  She denies any symptoms at this time but she has felt weak recently.  Past Medical History:  Diagnosis Date  . AAA (abdominal aortic aneurysm) (HCC)   . Anemia   . Arthritis    Neck  . Bladder prolapse, female, acquired   . Carotid artery occlusion   . End[place]ak post endovascular aneurysm repair [facility](HCC), Type I 02/2016   [facility] for aortic cuff placement  . Hearing aid worn   . Hypothyroidism   . Myocardial infarction (HCC) 1982  . Polymyalgia rheumatica (HCC)   . Prolapsed internal hemorrhoids, grade 3   . Shingles   . Uterine prolapse     Patient Active Problem List   Diagnosis Date Noted  . Near syncope   . Polymyalgia rheumatica (HCC) 06/23/2017  . Symptomatic hypotension 06/23/2017  . Prolapsed internal hemorrhoids, grade 2 and 3   . Rectal bleeding 09/27/2016  . Acute blood loss anemia   . AAA (abdominal aortic aneurysm) (HCC) 02/29/2016  . Occlusion and stenosis of carotid artery without mention of cerebral infarction 08/09/2012    Past Surgical History:  Procedure Laterality Date  . ABDOMINAL AORTIC ANEURYSM REPAIR  2008   Infrarenal aneurysm stent graft repair  . ABDOMINAL AORTIC ENDOVASCULAR  STENT GRAFT N/A 02/29/2016   Procedure: Aortic cuff/Gore;  Surgeon: Larina Earthlyodd F Early, MD;  [facility];  Service: Vascular;  Laterality: N/A;  . APPENDECTOMY    . CAROTID ENDARTERECTOMY  12-07-06   Left CEA  . CARPAL TUNNEL RELEASE Left   . CATARACT EXTRACTION Bilateral   . CHOLECYSTECTOMY     Gall Bladder  . COLONOSCOPY WITH PROPOFOL N/A 09/28/2016   Procedure: COLONOSCOPY WITH PROPOFOL;  Surgeon: Rachael Feeaniel P Jacobs, MD;  [facility];  Service: Endoscopy;  Laterality: N/A;  . HEMORRHOID BANDING    . PERIPHERAL VASCULAR CATHETERIZATION N/A 02/03/2016   Procedure: Abdominal Aortogram;  Surgeon: Pryor OchoaJames D Lawson, MD;  [facility];  Service: Cardiovascular;  Laterality: N/A;  . PERIPHERAL VASCULAR CATHETERIZATION Bilateral 02/03/2016   Procedure: Lower Extremity Angiography;  Surgeon: Pryor OchoaJames D Lawson, MD;  [facility];  Service: Cardiovascular;  Laterality: Bilateral;  . TONSILLECTOMY      Allergies Atorvastatin; Codeine; Crestor [rosuvastatin]; Penicillins; Adhesive [tape]; and Cephalexin  Social History Social History   Tobacco Use  . Smoking status: Former Smoker    Last attempt to quit: 08/22/1980    Years since quitting: 37.6  . Smokeless tobacco: Never Used  Substance Use Topics  . Alcohol use: No  . Drug use: No   Review of Systems Constitutional: Negative for fever. Cardiovascular: Negative for chest pain. Respiratory: Negative for shortness of breath. Gastrointestinal: Negative for abdominal pain, vomiting and diarrhea.  Positive for rectal  bleeding Musculoskeletal: Negative for back pain. Skin: Negative for rash. Neurological: Positive for generalized weakness  All systems negative/normal/unremarkable except as stated in the HPI  ____________________________________________   PHYSICAL EXAM:  VITAL SIGNS: ED Triage Vitals  Enc Vitals Group     BP      Pulse      Resp      Temp      Temp src      SpO2      Weight       Height      Head Circumference      Peak Flow      Pain Score      Pain Loc      Pain Edu?      Excl. in GC?    Constitutional: Alert and oriented. Well appearing and in no distress. Eyes: Conjunctivae are normal. Normal extraocular movements. ENT   Head: Normocephalic and atraumatic.   Nose: No congestion/rhinnorhea.   Mouth/Throat: Mucous membranes are moist.   Neck: No stridor. Cardiovascular: Normal rate, regular rhythm. No murmurs, rubs, or gallops. Respiratory: Normal respiratory effort without tachypnea nor retractions. Breath sounds are clear and equal bilaterally. No wheezes/rales/rhonchi. Gastrointestinal: Soft and nontender. Normal bowel sounds Rectal: No bleeding, external nonbleeding hemorrhoid, mild tenderness, no constipation Musculoskeletal: Nontender with normal range of motion in extremities. No lower extremity tenderness nor edema. Neurologic:  Normal speech and language. No gross focal neurologic deficits are appreciated.  Skin:  Skin is warm, dry and intact.  Pallor is noted Psychiatric: Mood and affect are normal. Speech and behavior are normal.  ____________________________________________  ED COURSE:  As part of my medical decision making, I reviewed the following data within the electronic MEDICAL RECORD NUMBER History obtained from family if available, nursing notes, old chart and ekg, as well as notes from prior ED visits. Patient presented for anemia and rectal bleeding, we will assess with labs and imaging as indicated at this time.   Procedures ____________________________________________   CRITICAL CARE Performed by: Ulice Dash   Total critical care time: 30 minutes  Critical care time was exclusive of separately billable procedures and treating other patients.  Critical care was necessary to treat or prevent imminent or life-threatening deterioration.  Critical care was time spent personally by me on the following activities:  development of treatment plan with patient and/or surrogate as well as nursing, discussions with consultants, evaluation of patient's response to treatment, examination of patient, obtaining history from patient or surrogate, ordering and performing treatments and interventions, ordering and review of laboratory studies, ordering and review of radiographic studies, pulse oximetry and re-evaluation of patient's condition.   LABS (pertinent positives/negatives)  Labs Reviewed  CBC WITH DIFFERENTIAL/PLATELET - Abnormal; Notable for the following components:      Result Value   RBC 1.96 (*)    Hemoglobin 7.0 (*)    HCT 20.9 (*)    MCV 106.7 (*)    MCH 35.5 (*)    RDW 19.0 (*)    nRBC 4 (*)    Neutro Abs 6.9 (*)    Lymphs Abs 0.3 (*)    Monocytes Absolute 0.1 (*)    All other components within normal limits  COMPREHENSIVE METABOLIC PANEL - Abnormal; Notable for the following components:   Glucose, Bld 196 (*)    Calcium 8.1 (*)    Total Protein 5.5 (*)    Albumin 3.2 (*)    Alkaline Phosphatase 35 (*)    GFR calc non Af  Amer 54 (*)    All other components within normal limits  URINALYSIS, COMPLETE (UACMP) WITH MICROSCOPIC - Abnormal; Notable for the following components:   Color, Urine YELLOW (*)    APPearance CLEAR (*)    Glucose, UA >=500 (*)    Ketones, ur 5 (*)    All other components within normal limits  TROPONIN I  PROTIME-INR  PATHOLOGIST SMEAR REVIEW  TYPE AND SCREEN  PREPARE RBC (CROSSMATCH)  ____________________________________________  DIFFERENTIAL DIAGNOSIS   Anemia, diverticulosis, hemorrhoidal bleeding, colon cancer  FINAL ASSESSMENT AND PLAN  Anemia, rectal bleeding   Plan: The patient had presented for anemia with directions for blood transfusion. Patient's labs did reveal significant anemia, hemoglobin was over 9 less than 2 weeks ago.  She has not had any further bleeding here, she will receive a blood transfusion of 1 unit packed red blood cells.  She  will need to be seen soon by gastroenterology, is not sure when the last colonoscopy was that she had.  She has requested transfer to [facility].   Ulice Dash, MD   Note: This note was generated in part or whole with voice recognition software. Voice recognition is usually quite accurate but there are transcription errors that can and very often do occur. I apologize for any typographical errors that were not detected and corrected.     Emily Filbert, MD 03/22/18 1755    Emily Filbert, MD 04/02/18 1539

## 2018-03-23 DIAGNOSIS — K625 Hemorrhage of anus and rectum: Secondary | ICD-10-CM

## 2018-03-23 LAB — TYPE AND SCREEN
ABO/RH(D): A POS
Antibody Screen: NEGATIVE
Unit division: 0

## 2018-03-23 LAB — PATHOLOGIST SMEAR REVIEW

## 2018-03-23 LAB — BASIC METABOLIC PANEL
ANION GAP: 6 (ref 5–15)
BUN: 13 mg/dL (ref 8–23)
CALCIUM: 8 mg/dL — AB (ref 8.9–10.3)
CO2: 24 mmol/L (ref 22–32)
Chloride: 113 mmol/L — ABNORMAL HIGH (ref 98–111)
Creatinine, Ser: 0.72 mg/dL (ref 0.44–1.00)
GFR calc non Af Amer: >60 mL/min (ref >60)
Glucose, Bld: 118 mg/dL — ABNORMAL HIGH (ref 70–99)
Potassium: 3.7 mmol/L (ref 3.5–5.1)
Sodium: 143 mmol/L (ref 135–145)

## 2018-03-23 LAB — CBC
HCT: 22.7 % — ABNORMAL LOW (ref 35.0–47.0)
HEMOGLOBIN: 7.7 g/dL — AB (ref 12.0–16.0)
MCH: 35.2 pg — AB (ref 26.0–34.0)
MCHC: 34.2 g/dL (ref 32.0–36.0)
MCV: 102.8 fL — ABNORMAL HIGH (ref 80.0–100.0)
Platelets: 181 10*3/uL (ref 150–440)
RBC: 2.2 MIL/uL — AB (ref 3.80–5.20)
RDW: 20.2 % — ABNORMAL HIGH (ref 11.5–14.5)
WBC: 4.9 10*3/uL (ref 3.6–11.0)

## 2018-03-23 LAB — GLUCOSE, CAPILLARY
GLUCOSE-CAPILLARY: 95 mg/dL (ref 70–99)
Glucose-Capillary: 196 mg/dL — ABNORMAL HIGH (ref 70–99)
Glucose-Capillary: 176 mg/dL — ABNORMAL HIGH (ref 70–99)

## 2018-03-23 LAB — BPAM RBC
BLOOD PRODUCT EXPIRATION DATE: 201908312359
ISSUE DATE / TIME: 201908012050
Unit Type and Rh: 6200

## 2018-03-23 LAB — HEMOGLOBIN: Hemoglobin: 8 g/dL — ABNORMAL LOW (ref 12.0–16.0)

## 2018-03-23 MED ORDER — INSULIN ASPART 100 UNIT/ML ~~LOC~~ SOLN
0.0000 [IU] | Freq: Three times a day (TID) | SUBCUTANEOUS | Status: DC
  Administered 2018-03-23 – 2018-03-24 (×2): 2 [IU] via SUBCUTANEOUS
  Administered 2018-03-24: 3 [IU] via SUBCUTANEOUS
  Administered 2018-03-25: 1 [IU] via SUBCUTANEOUS
  Administered 2018-03-25: 5 [IU] via SUBCUTANEOUS
  Administered 2018-03-26: 2 [IU] via SUBCUTANEOUS
  Filled 2018-03-23 (×6): qty 1

## 2018-03-23 MED ORDER — LEVOTHYROXINE SODIUM 100 MCG PO TABS
100.0000 ug | ORAL_TABLET | ORAL | Status: DC
  Administered 2018-03-24 – 2018-03-25 (×2): 100 ug via ORAL
  Filled 2018-03-23 (×2): qty 1

## 2018-03-23 MED ORDER — VITAMIN D (ERGOCALCIFEROL) 1.25 MG (50000 UNIT) PO CAPS
50000.0000 [IU] | ORAL_CAPSULE | ORAL | Status: DC
  Administered 2018-03-24: 50000 [IU] via ORAL
  Filled 2018-03-23: qty 1

## 2018-03-23 MED ORDER — HYDROCORTISONE ACETATE 25 MG RE SUPP
25.0000 mg | Freq: Two times a day (BID) | RECTAL | Status: DC
  Administered 2018-03-23 – 2018-03-26 (×6): 25 mg via RECTAL
  Filled 2018-03-23 (×8): qty 1

## 2018-03-23 MED ORDER — ALUM & MAG HYDROXIDE-SIMETH 200-200-20 MG/5ML PO SUSP
30.0000 mL | ORAL | Status: DC | PRN
Start: 2018-03-23 — End: 2018-03-26
  Administered 2018-03-23: 30 mL via ORAL
  Filled 2018-03-23: qty 30

## 2018-03-23 MED ORDER — INSULIN ASPART 100 UNIT/ML ~~LOC~~ SOLN: 0.0000 [IU] | Freq: Every day | SUBCUTANEOUS | Status: DC

## 2018-03-23 NOTE — Evaluation (Signed)
Physical Therapy Evaluation Patient Details Name: Margaret Castillo MRN: [id number] DOB: [birth year] Today's Date: 03/23/2018   History of Present Illness  Patient is 83 yo female presented to ED from [facility] due to Hgb of 6.5. PMH of AAA, anemia, arthritis, carotid artery stenosis, endovascular aneurysm repair, MI, polymyalgia rheumatica, hemorrhoids.   Clinical Impression  Patient A&Ox4 at start of session, no complaints of pain. Patient reports that she was admitted from [facility] where she had been for almost two weeks. She states in the last couple of months she has felt weaker, had less endurance, and was having more difficulty walking and performing self care tasks, needing more breaks. Patient reports she lived alone in 1 story house with 2 STE, though her son was staying with her before her [facility] stay. States she feels she always has someone checking on her or she can call for assistance.  Patient demonstrates generalized weakness of UE and LE, as well as decreased activity tolerance and endurance. Bed mobility with supervision and use of bed rails, patient able to sit EOB with good balance. Sit <> stand with CGA, RW, ambulated ~33ft to chair. Complaints of fatigue at end of mobility. The patient would benefit from further skilled PT to address theses changes, to maximize independence, mobility, and safety.     Follow Up Recommendations SNF    Equipment Recommendations  Other (comment)(TBD at next venue of care, patient has 4WW, cane)    Recommendations for Other Services       Precautions / Restrictions Precautions Precautions: Fall Restrictions Weight Bearing Restrictions: No      Mobility  Bed Mobility Overal bed mobility: Needs Assistance Bed Mobility: Supine to Sit     Supine to sit: Supervision;HOB elevated     General bed mobility comments: extended time to maximize independence  Transfers Overall transfer level: Needs assistance Equipment used:  Rolling walker (2 wheeled) Transfers: Sit to/from Stand Sit to Stand: Min guard            Ambulation/Gait Ambulation/Gait assistance: Min guard Gait Distance (Feet): 4 Feet Assistive device: Rolling walker (2 wheeled)       General Gait Details: shuffling step, no LOB, fatigued quickly  Stairs            Wheelchair Mobility    Modified Rankin (Stroke Patients Only)       Balance Overall balance assessment: Needs assistance Sitting-balance support: Feet supported Sitting balance-Leahy Scale: Good       Standing balance-Leahy Scale: Poor                               Pertinent Vitals/Pain Pain Assessment: No/denies pain    Home Living Family/patient expects to be discharged to:: Private residence Living Arrangements: Alone Available Help at Discharge: Family;Available 24 hours/day Type of Home: House Home Access: Stairs to enter Entrance Stairs-Rails: Right Entrance Stairs-Number of Steps: 2 Home Layout: One level Home Equipment: Walker - 2 wheels;Shower seat;Walker - 4 wheels;Cane - single point Additional Comments: Patient has been at [facility] for about 2 weeks prior to this admission. Previously lived alone, states her son has been living with her for about 3 months. States she holds onto his arm for stair navigation and has been recently only ambulating with RW for a few steps.     Prior Function Level of Independence: Independent  Hand Dominance   Dominant Hand: Right    Extremity/Trunk Assessment   Upper Extremity Assessment Upper Extremity Assessment: Generalized weakness;Defer to OT evaluation    Lower Extremity Assessment Lower Extremity Assessment: Generalized weakness;RLE deficits/detail;LLE deficits/detail RLE Deficits / Details: 3+/5 LLE Deficits / Details: 3+/5       Communication   Communication: HOH  Cognition Arousal/Alertness: Awake/alert Behavior During Therapy: WFL for tasks  assessed/performed Overall Cognitive Status: Within Functional Limits for tasks assessed                                        General Comments      Exercises     Assessment/Plan    PT Assessment Patient needs continued PT services  PT Problem List Decreased strength;Decreased mobility;Decreased safety awareness;Decreased range of motion;Decreased activity tolerance;Decreased balance;Decreased knowledge of use of DME       PT Treatment Interventions DME instruction;Therapeutic activities;Gait training;Patient/family education;Therapeutic exercise;Stair training;Balance training;Neuromuscular re-education;Functional mobility training    PT Goals (Current goals can be found in the Care Plan section)  Acute Rehab PT Goals Patient Stated Goal: Patient would like to return to PLOF PT Goal Formulation: With patient Time For Goal Achievement: 04/06/18 Potential to Achieve Goals: Fair    Frequency Min 2X/week   Barriers to discharge        Co-evaluation               AM-PAC PT "6 Clicks" Daily Activity  Outcome Measure Difficulty turning over in bed (including adjusting bedclothes, sheets and blankets)?: A Little Difficulty moving from lying on back to sitting on the side of the bed? : A Little Difficulty sitting down on and standing up from a chair with arms (e.g., wheelchair, bedside commode, etc,.)?: Unable Help needed moving to and from a bed to chair (including a wheelchair)?: A Little Help needed walking in hospital room?: A Lot Help needed climbing 3-5 steps with a railing? : Total 6 Click Score: 13    End of Session Equipment Utilized During Treatment: Gait belt Activity Tolerance: Patient limited by fatigue Patient left: in chair;with chair alarm set;with SCD's reapplied;Other (comment);with call bell/phone within reach(heels elevated) Nurse Communication: Mobility status PT Visit Diagnosis: Unsteadiness on feet (R26.81);Other abnormalities of  gait and mobility (R26.89);Muscle weakness (generalized) (M62.81)    Time: [id number] PT Time Calculation (min) (ACUTE ONLY): 25 min   Charges:   PT Evaluation $PT Eval Low Complexity: 1 Low PT Treatments $Therapeutic Activity: 8-22 mins       Olga Coasteriana Tinya Cadogan PT, DPT 3:21 PM,03/23/18 [phone]

## 2018-03-23 NOTE — Consult Note (Signed)
Surgical Consultation  03/23/2018  Margaret Castillo is an 83 y.o. female.   Referring Physician: Andee Poles  OV:FIEPPI bleeding  HPI: This patient with a year of rectal bleeding.  It comes and goes but she was admitted for a diagnosis of a low hemoglobin with some dizziness.  Her son is present during this interview to help with the historical portions.  She has had prior banding's in the past.  She is never had a hemorrhoidectomy.  Of note she states that it is only recently that they had started using some sort of unknown cream on the outside only nothing inside the rectum.  She has stopped her Plavix 3 days ago.  Past Medical History:  Diagnosis Date  . AAA (abdominal aortic aneurysm) ([place])   . Anemia   . Arthritis    Neck  . Bladder prolapse, female, acquired   . Carotid artery occlusion   . Endoleak post endovascular aneurysm repair [facility]), Type I 02/2016   [facility] for aortic cuff placement  . Hearing aid worn   . Hypothyroidism   . Myocardial infarction ([place]) 1982  . Polymyalgia rheumatica ([place])   . Prolapsed internal hemorrhoids, grade 3   . Shingles   . Uterine prolapse     Past Surgical History:  Procedure Laterality Date  . ABDOMINAL AORTIC ANEURYSM REPAIR  2008   Infrarenal aneurysm stent graft repair  . ABDOMINAL AORTIC ENDOVASCULAR STENT GRAFT N/A 02/29/2016   Procedure: Aortic cuff/Gore;  Surgeon: Rosetta Posner, MD;  Location: [place];  Service: Vascular;  Laterality: N/A;  . APPENDECTOMY    . CAROTID ENDARTERECTOMY  12-07-06   Left CEA  . CARPAL TUNNEL RELEASE Left   . CATARACT EXTRACTION Bilateral   . CHOLECYSTECTOMY     Gall Bladder  . COLONOSCOPY WITH PROPOFOL N/A 09/28/2016   Procedure: COLONOSCOPY WITH PROPOFOL;  Surgeon: Milus Banister, MD;  Location: [facility];  Service: Endoscopy;  Laterality: N/A;  . HEMORRHOID BANDING    . PERIPHERAL VASCULAR CATHETERIZATION N/A 02/03/2016   Procedure: Abdominal Aortogram;  Surgeon: Mal Misty, MD;   Location: [place] CV LAB;  Service: Cardiovascular;  Laterality: N/A;  . PERIPHERAL VASCULAR CATHETERIZATION Bilateral 02/03/2016   Procedure: Lower Extremity Angiography;  Surgeon: Mal Misty, MD;  Location: [place] CV LAB;  Service: Cardiovascular;  Laterality: Bilateral;  . TONSILLECTOMY      Family History  Problem Relation Age of Onset  . Heart disease Father        After age 53   . Heart attack Father   . Diabetes Sister   . Ovarian cancer Sister        may be uterine...  . Cancer Brother   . Heart disease Brother   . Diabetes Brother   . Prostate cancer Brother   . Stroke Mother   . Stroke Sister   . Heart disease Brother   . Arthritis Brother   . Prostate cancer Brother   . Heart attack Sister   . Diabetes Paternal Grandfather     Social History:  reports that she quit smoking about 37 years ago. She has never used smokeless tobacco. She reports that she does not drink alcohol or use drugs.  Allergies:  Allergies  Allergen Reactions  . Atorvastatin Other (See Comments)    MUSCLE PAIN (all statins)  . Codeine Nausea Only  . Crestor [Rosuvastatin] Other (See Comments)    Leg pain/ joint pain  ( ALL THE STATIN's )  .  Penicillins Rash    Has patient had a PCN reaction causing immediate rash, facial/tongue/throat swelling, SOB or lightheadedness with hypotension: Yes Has patient had a PCN reaction causing severe rash involving mucus membranes or skin necrosis: No Has patient had a PCN reaction that required hospitalization No Has patient had a PCN reaction occurring within the last 10 years: No If all of the above answers are "NO", then may proceed with Cephalosporin use.   . Adhesive [Tape] Other (See Comments)  . Cephalexin Rash    Medications reviewed.   Review of Systems:   Review of Systems  Constitutional: Negative for chills and fever.  Gastrointestinal: Positive for blood in stool. Negative for abdominal pain, constipation, diarrhea and  melena.  Neurological: Positive for dizziness. Negative for headaches.     Physical Exam:  BP (!) 159/59 (BP Location: Right Leg)   Pulse 68   Temp 98.2 F (36.8 C) (Oral)   Resp 20   Ht _0  (1.651 m)   Wt 196 lb 3.4 oz (89 kg)   SpO2 98%   BMI 32.65 kg/m   Physical Exam  Constitutional: She appears well-developed and well-nourished. No distress.  HENT:  Head: Normocephalic and atraumatic.  Abdominal: Soft. She exhibits no distension. There is no tenderness.  Genitourinary:  Genitourinary Comments: Obvious external hemorrhoids and palpable internal hemorrhoids no blood present on digital exam stool present which is brown without blood.  No melanotic color  Skin: She is not diaphoretic.  Vitals reviewed.     Results for orders placed or performed during the hospital encounter of 03/22/18 (from the past 48 hour(s))  CBC with Differential     Status: Abnormal   Collection Time: 03/22/18  3:49 PM  Result Value Ref Range   WBC 7.3 3.6 - 11.0 K/uL   RBC 1.96 (L) 3.80 - 5.20 MIL/uL   Hemoglobin 7.0 (L) 12.0 - 16.0 g/dL   HCT 20.9 (L) 35.0 - 47.0 %   MCV 106.7 (H) 80.0 - 100.0 fL   MCH 35.5 (H) 26.0 - 34.0 pg   MCHC 33.3 32.0 - 36.0 g/dL   RDW 19.0 (H) 11.5 - 14.5 %   Platelets 218 150 - 440 K/uL   Neutrophils Relative % 90 %   Lymphocytes Relative 4 %   Monocytes Relative 2 %   Eosinophils Relative 0 %   Basophils Relative 0 %   Band Neutrophils 4 %   Metamyelocytes Relative 0 %   Myelocytes 0 %   Promyelocytes Relative 0 %   Blasts 0 %   nRBC 4 (H) 0 /100 WBC   Other 0 %   Neutro Abs 6.9 (H) 1.4 - 6.5 K/uL   Lymphs Abs 0.3 (L) 1.0 - 3.6 K/uL   Monocytes Absolute 0.1 (L) 0.2 - 0.9 K/uL   Eosinophils Absolute 0.0 0 - 0.7 K/uL   Basophils Absolute 0.0 0 - 0.1 K/uL   RBC Morphology POLYCHROMASIA PRESENT     Comment: MIXED RBC POPULATION   Smear Review      PATH REVIEW HAS BEEN ORDERED ON THIS ACCESSION NUMBER    Comment: Performed at [facility],  [address] [place], [place] [address]  Comprehensive metabolic panel     Status: Abnormal   Collection Time: 03/22/18  3:49 PM  Result Value Ref Range   Sodium 141 135 - 145 mmol/L   Potassium 3.6 3.5 - 5.1 mmol/L   Chloride 109 98 - 111 mmol/L   CO2 22 22 -  32 mmol/L   Glucose, Bld 196 (H) 70 - 99 mg/dL   BUN 14 8 - 23 mg/dL   Creatinine, Ser 0.97 0.44 - 1.00 mg/dL   Calcium 8.1 (L) 8.9 - 10.3 mg/dL   Total Protein 5.5 (L) 6.5 - 8.1 g/dL   Albumin 3.2 (L) 3.5 - 5.0 g/dL   AST 35 15 - 41 U/L   ALT 12 0 - 44 U/L   Alkaline Phosphatase 35 (L) 38 - 126 U/L   Total Bilirubin 0.7 0.3 - 1.2 mg/dL   GFR calc non Af Amer 54 (L) >60 mL/min   GFR calc Af Amer >60 >60 mL/min    Comment: (NOTE) The eGFR has been calculated using the CKD EPI equation. This calculation has not been validated in all clinical situations. eGFR's persistently <60 mL/min signify possible Chronic Kidney Disease.    Anion gap 10 5 - 15    Comment: Performed at [facility], [address] [place], [place] [address]  Troponin I     Status: None   Collection Time: 03/22/18  3:49 PM  Result Value Ref Range   Troponin I <0.03 <0.03 ng/mL    Comment: Performed at [facility], [address] [place], [place] [address]  Urinalysis, Complete w Microscopic     Status: Abnormal   Collection Time: 03/22/18  3:49 PM  Result Value Ref Range   Color, Urine YELLOW (A) YELLOW   APPearance CLEAR (A) CLEAR   Specific Gravity, Urine 1.008 1.005 - 1.030   pH 5.0 5.0 - 8.0   Glucose, UA >=500 (A) NEGATIVE mg/dL   Hgb urine dipstick NEGATIVE NEGATIVE   Bilirubin Urine NEGATIVE NEGATIVE   Ketones, ur 5 (A) NEGATIVE mg/dL   Protein, ur NEGATIVE NEGATIVE mg/dL   Nitrite NEGATIVE NEGATIVE   Leukocytes, UA NEGATIVE NEGATIVE   RBC / HPF 0-5 0 - 5 RBC/hpf   WBC, UA 0-5 0 - 5 WBC/hpf   Bacteria, UA NONE SEEN NONE SEEN   Squamous Epithelial / LPF NONE SEEN 0 - 5   Mucus PRESENT     Comment: Performed at  [facility], [address] [place], [place] [address]  Type and screen     Status: None   Collection Time: 03/22/18  3:49 PM  Result Value Ref Range   ABO/RH(D) A POS    Antibody Screen NEG    Sample Expiration 03/25/2018    Unit Number [id number]    Blood Component Type RED CELLS,LR    Unit division 00    Status of Unit ISSUED,FINAL    Transfusion Status OK TO TRANSFUSE    Crossmatch Result      Compatible Performed at [facility], [address] [place], [place] [address]   Protime-INR     Status: None   Collection Time: 03/22/18  3:49 PM  Result Value Ref Range   Prothrombin Time 13.5 11.4 - 15.2 seconds   INR 1.04     Comment: Performed at [facility], [address] [place], [place] [address]  Pathologist smear review     Status: None   Collection Time: 03/22/18  3:49 PM  Result Value Ref Range   Path Review Peripheral blood smear is reviewed.      Comment: Patient with chronic macrocytosis. Known B12 deficiency per Care Everywhere, on B12 replacement. Macrocytic anemia with circulating nucleated RBCs, consistent with acute on chronic anemia. Patient with recent history of GI bleeding. Rouleaux. Unremarkable platelets and  WBC. Recommend folate testing.  Reviewed by Dellia Nims Reuel Derby, M.D. Performed at [facility], [address] [place], [place] [address]   Prepare RBC     Status: None   Collection Time: 03/22/18  5:54 PM  Result Value Ref Range   Order Confirmation      ORDER PROCESSED BY BLOOD BANK Performed at [facility], [address] [place], [place] [address]   ABO/Rh     Status: None   Collection Time: 03/22/18  6:27 PM  Result Value Ref Range   ABO/RH(D)      A POS Performed at [facility], [address] [place], [place] [address]   Basic metabolic panel     Status: Abnormal   Collection Time: 03/23/18  3:19 AM  Result Value Ref Range   Sodium 143 135 - 145 mmol/L   Potassium  3.7 3.5 - 5.1 mmol/L   Chloride 113 (H) 98 - 111 mmol/L   CO2 24 22 - 32 mmol/L   Glucose, Bld 118 (H) 70 - 99 mg/dL   BUN 13 8 - 23 mg/dL   Creatinine, Ser 0.72 0.44 - 1.00 mg/dL   Calcium 8.0 (L) 8.9 - 10.3 mg/dL   GFR calc non Af Amer >60 >60 mL/min   GFR calc Af Amer >60 >60 mL/min    Comment: (NOTE) The eGFR has been calculated using the CKD EPI equation. This calculation has not been validated in all clinical situations. eGFR's persistently <60 mL/min signify possible Chronic Kidney Disease.    Anion gap 6 5 - 15    Comment: Performed at [facility], [address] [place], [place] [address]  CBC     Status: Abnormal   Collection Time: 03/23/18  3:19 AM  Result Value Ref Range   WBC 4.9 3.6 - 11.0 K/uL   RBC 2.20 (L) 3.80 - 5.20 MIL/uL   Hemoglobin 7.7 (L) 12.0 - 16.0 g/dL   HCT 22.7 (L) 35.0 - 47.0 %   MCV 102.8 (H) 80.0 - 100.0 fL   MCH 35.2 (H) 26.0 - 34.0 pg   MCHC 34.2 32.0 - 36.0 g/dL   RDW 20.2 (H) 11.5 - 14.5 %   Platelets 181 150 - 440 K/uL    Comment: Performed at [facility], [address] [place], [place] [address]  Glucose, capillary     Status: None   Collection Time: 03/23/18  7:44 AM  Result Value Ref Range   Glucose-Capillary 95 70 - 99 mg/dL  Hemoglobin     Status: Abnormal   Collection Time: 03/23/18  1:37 PM  Result Value Ref Range   Hemoglobin 8.0 (L) 12.0 - 16.0 g/dL    Comment: Performed at [facility], [address], [place], [place] [address]   No results found.  Assessment/Plan:  Labs and vital signs personally reviewed. Currently the patient is not bleeding.  She has been having this problem for a year and has recently been transfused up to 8.  She is not currently utilizing any sort of cream and she still has a Plavix effect as her last dose was 3 days ago.  Recommendations would be to start a hydrocortisone-containing suppository internally transanally to better treat the internal hemorrhoids.  She is  not currently bleeding and should respond to this conservative therapy as well as a transfusion.  We will continue to follow at this time.  No surgical needs.  Florene Glen, MD, FACS

## 2018-03-23 NOTE — Consult Note (Signed)
Wyline Mood , MD [address], [place], [place], [address] [address], [address], [place], [place], [address] Phone: [phone]  Fax: [phone]  Consultation  Referring Provider:     No ref. provider found Primary Care Physician:  [name], MD Primary Gastroenterologist:  Dr Leone Payor    Reason for Consultation:     Rectal bleeding   Date of Admission:  03/22/2018 Date of Consultation:  03/23/2018         HPI:   Margaret Castillo is a 83 y.o. female admitted with rectal bleeding .She has ha a colonoscopy  by Dr. Rob Bunting in February 2018 for rectal bleeding and it showed diverticulosis of the colon no other lesions were noted.  I reviewed the discharge summary from February 2018 and it I noted that the patient had presented with rectal bleeding and it was a diverticular bleed vs a hemorrhoidal bleed.  She did receive transfusion of the point of time.  She underwent banding in April 2018.  Dr. Teresita Madura s note from December 2018 mentions that she has extensive cardiac issues and he was considering a second opinion from cardiology.  At that point of time her rectal exam showed grade 3 prolapsed internal hemorrhoids in all positions.  On this admission the patient was transferred from [facility] facility due to low hemoglobin level for incidentally found for a blood test.  Patient has had episodes of rectal bleeding for the past 24 hours.  She says over the past 2 weeks every time she has a bowel movement has associated pain and rectal bleeding. She sees bright red blood in the toilet bowl. When I went into the room she had a further episode of fresh red blood in the toilet bowl. She is extremely hard of hearing, denies any pain presently.    CBC Latest Ref Rng & Units 03/23/2018 03/22/2018 07/28/2017  WBC 3.6 - 11.0 K/uL 4.9 7.3 10.0  Hemoglobin 12.0 - 16.0 g/dL 7.7(L) 7.0(L) 10.8(L)  Hematocrit 35.0 - 47.0 % 22.7(L) 20.9(L) 32.2(L)  Platelets 150 - 440 K/uL 181  218 268.0     Past Medical History:  Diagnosis Date  . AAA (abdominal aortic aneurysm) ([facility])   . Anemia   . Arthritis    Neck  . Bladder prolapse, female, acquired   . Carotid artery occlusion   . 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 Hearing aid worn   . Hypothyroidism   . Myocardial infarction ([facility]) 1982  . Polymyalgia rheumatica ([facility])   . Prolapsed internal hemorrhoids, grade 3   . Shingles   . Uterine prolapse     Past Surgical History:  Procedure Laterality Date  . ABDOMINAL AORTIC ANEURYSM REPAIR  2008   Infrarenal aneurysm stent graft repair  . ABDOMINAL AORTIC ENDOVASCULAR STENT GRAFT N/A 02/29/2016   Procedure: Aortic cuff/Gore;  Surgeon: Larina Earthly, MD;  Location[facility] OR;  Service: Vascular;  Laterality: N/A;  . APPENDECTOMY    . CAROTID ENDARTERECTOMY  12-07-06   Left CEA  . CARPAL TUNNEL RELEASE Left   . CATARACT EXTRACTION Bilateral   . CHOLECYSTECTOMY     Gall Bladder  . COLONOSCOPY WITH PROPOFOL N/A 09/28/2016   Procedure: COLONOSCOPY WITH PROPOFOL;  Surgeon: Rachael Fee, MD;  Location: [facility] ENDOSCOPY;  Service: Endoscopy;  Laterality: N/A;  . HEMORRHOID BANDING    . PERIPHERAL VASCULAR CATHETERIZATION N/A 02/03/2016  Procedure: Abdominal Aortogram;  Surgeon: Pryor OchoaJames D Lawson, MD;  [facility];  Service: Cardiovascular;  Laterality: N/A;  . PERIPHERAL VASCULAR CATHETERIZATION Bilateral 02/03/2016   Procedure: Lower Extremity Angiography;  Surgeon: Pryor OchoaJames D Lawson, MD;  [facility];  Service: Cardiovascular;  Laterality: Bilateral;  . TONSILLECTOMY      Prior to Admission medications   Medication Sig Start Date End Date Taking? Authorizing Provider  DULoxetine (CYMBALTA) 20 MG capsule Take 20 mg by mouth daily.   Yes [provider]  ferrous sulfate 325 (65 FE) MG tablet Take 325 mg by mouth daily with breakfast.   Yes [provider]  insulin NPH Human (HUMULIN N,NOVOLIN N) 100 UNIT/ML injection Inject 6 Units into the skin 2 (two) times daily before a meal.   Yes [provider]  levothyroxine (SYNTHROID, LEVOTHROID) 100 MCG tablet Take 100 mcg by mouth daily. DO NOT TAKE ON Monday AND WEDNESDAY   Yes [provider]  Multiple Vitamin (MULTIVITAMIN) tablet Take 1 tablet by mouth daily.   Yes [provider]  pantoprazole (PROTONIX) 40 MG tablet Take 40 mg by mouth daily.   Yes [provider]  polyethylene glycol (MIRALAX / GLYCOLAX) packet Take 17 g by mouth daily as needed for mild constipation. Patient taking differently: Take 17 g by mouth 2 (two) times daily.  09/29/16  Yes Casey BurkittFitzgerald, Hillary Moen, MD  predniSONE (DELTASONE) 5 MG tablet Take 4 tablets (20 mg total) by mouth daily with breakfast. Patient taking differently: Take 30 mg by mouth daily with breakfast. 03/13/18-03/23/18 08/03/17  Yes Iva BoopGessner, Carl E, MD  rosuvastatin (CRESTOR) 5 MG tablet Take 5 mg by mouth at bedtime.   Yes [provider]  acetaminophen (TYLENOL) 500 MG tablet Take 500-1,000 mg by mouth as needed for mild pain.     [provider]  Vitamin D, Ergocalciferol, (DRISDOL) 50000 UNITS CAPS Take 50,000 Units by mouth every 14 (fourteen) days.     [provider]    Family History  Problem Relation Age of Onset  . Heart disease Father        After age 81   . Heart attack Father   . Diabetes Sister   . Ovarian cancer Sister        may be uterine...  . Cancer Brother   . Heart disease Brother   . Diabetes Brother   . Prostate cancer Brother   . Stroke Mother   . Stroke Sister   . Heart disease Brother   . Arthritis Brother   . Prostate cancer Brother   . Heart attack Sister   . Diabetes Paternal Grandfather      Social History   Tobacco Use  . Smoking status: Former Smoker    Last attempt to quit: 08/22/1980    Years since quitting: 37.6  . Smokeless tobacco: Never  Used  Substance Use Topics  . Alcohol use: No  . Drug use: No    Allergies as of 03/22/2018 - Review Complete 03/22/2018  Allergen Reaction Noted  . Atorvastatin Other (See Comments) 01/20/2016  . Codeine Nausea Only 08/03/2012  . Crestor [rosuvastatin] Other (See Comments) 08/13/2013  . Penicillins Rash 08/03/2012  . Adhesive [tape] Other (See Comments) 02/25/2016  . Cephalexin Rash 01/20/2016    Review of Systems:    All systems reviewed and negative except where noted in HPI.   Physical Exam:  Vital signs in last 24 hours: Temp:  Iker.Keens[98 F (  36.7 C)-98.5 F (36.9 C)] 98.5 F (36.9 C) (08/02 0410) Pulse Rate:  [69-84] 70 (08/02 0621) Resp:  [16-26] 18 (08/02 0410) BP: (112-183)/(47-75) 137/60 (08/02 0410) SpO2:  [98 %-100 %] 98 % (08/02 0621) Weight:  [195 lb 5.2 oz (88.6 kg)-197 lb (89.4 kg)] 196 lb 3.4 oz (89 kg) (08/02 0500) Last BM Date: 03/22/18 General:   Pleasant, cooperative in NAD Head:  Normocephalic and atraumatic. Eyes:   No icterus.   Conjunctiva pink. PERRLA. Ears:  Hard of hearing Neck:  Supple; no masses or thyroidomegaly Lungs: Respirations even and unlabored. Lungs clear to auscultation bilaterally.   No wheezes, crackles, or rhonchi.  Heart:  Regular rate and rhythm;  Without murmur, clicks, rubs or gallops Abdomen:  Soft, nondistended, nontender. Normal bowel sounds. No appreciable masses or hepatomegaly.  No rebound or guarding.   Rectal exam with a chaperone- externally large thrombosed hemorroids with a blue hue, no masses palpable on rectal exam, no blood on glove.  Neurologic:  Alert and oriented x3;  grossly normal neurologically. Skin:  Intact without significant lesions or rashes. Cervical Nodes:  No significant cervical adenopathy. Psych:  Alert and cooperative. Normal affect.  LAB RESULTS: Recent Labs    03/22/18 1549 03/23/18 0319  WBC 7.3 4.9  HGB 7.0* 7.7*  HCT 20.9* 22.7*  PLT 218 181   BMET Recent Labs    03/22/18 1549  03/23/18 0319  NA 141 143  K 3.6 3.7  CL 109 113*  CO2 22 24  GLUCOSE 196* 118*  BUN 14 13  CREATININE 0.97 0.72  CALCIUM 8.1* 8.0*   LFT Recent Labs    03/22/18 1549  PROT 5.5*  ALBUMIN 3.2*  AST 35  ALT 12  ALKPHOS 35*  BILITOT 0.7   PT/INR Recent Labs    03/22/18 1549  LABPROT 13.5  INR 1.04    STUDIES: No results found.    Impression / Plan:   Margaret Castillo is a 83 y.o. y/o female with a history of recent diverticular bleed in 2018 admitted with rectal bleeding that is painless. Colonoscopy in 2018 showed diverticulosis and large hemoroids .  With a 2 weeks history of persistent rectal bleeding associated with a bowel movement makes it more likely bleeding from her hemorroids which she seems to have failed prior treatment. It makes a diverticular bleed less likely to go on so long. Usually they are brisk and short lived.   I discussed my thoughts with Dr Cherlynn Kaiser and suggested have her be seen by the surgeons for more definitive treatment of her hemorrhoids. If there is a concern for a diverticular bleed that is active then can get a tagged RBC scan . I do not see any reason at this time for a repeat colonoscopy which she had in 2018   In the interim monitor CBC and transfuse as needed .   Thank you for involving me in the care of this patient.      LOS: 1 day   Wyline Mood, MD  03/23/2018, 10:41 AM

## 2018-03-23 NOTE — Progress Notes (Signed)
[facility] - [place] at [facility]   PATIENT NAME: Margaret Castillo    MR#:  [id number]  DATE OF BIRTH:  18-Oct-1934  SUBJECTIVE:   Patient here due to symptomatic anemia and rectal bleeding.  Patient was transfused hemoglobin improved posttransfusion.  She still did have a bloody bowel movement this morning.  She denies any abdominal pain nausea vomiting.  REVIEW OF SYSTEMS:    Review of Systems  Constitutional: Negative for chills and fever.  HENT: Negative for congestion and tinnitus.   Eyes: Negative for blurred vision and double vision.  Respiratory: Negative for cough, shortness of breath and wheezing.   Cardiovascular: Negative for chest pain, orthopnea and PND.  Gastrointestinal: Positive for blood in stool. Negative for abdominal pain, diarrhea, nausea and vomiting.  Genitourinary: Negative for dysuria and hematuria.  Neurological: Negative for dizziness, sensory change and focal weakness.  All other systems reviewed and are negative.   Nutrition: Clear Liquids Tolerating Diet: Yes Tolerating PT:  Await Eval.   DRUG ALLERGIES:   Allergies  Allergen Reactions  . Atorvastatin Other (See Comments)    MUSCLE PAIN (all statins)  . Codeine Nausea Only  . Crestor [Rosuvastatin] Other (See Comments)    Leg pain/ joint pain  ( ALL THE STATIN's )  . Penicillins Rash    Has patient had a PCN reaction causing immediate rash, facial/tongue/throat swelling, SOB or lightheadedness with hypotension: Yes Has patient had a PCN reaction causing severe rash involving mucus membranes or skin necrosis: No Has patient had a PCN reaction that required hospitalization No Has patient had a PCN reaction occurring within the last 10 years: No If all of the above answers are "NO", then may proceed with Cephalosporin use.   . Adhesive [Tape] Other (See Comments)  . Cephalexin Rash    VITALS:  Blood pressure (!) 159/59, pulse 68, temperature 98.2 F (36.8 C), temperature  source Oral, resp. rate 20, height 5\' 5"  (1.651 m), weight 89 kg (196 lb 3.4 oz), SpO2 98 %.  PHYSICAL EXAMINATION:   Physical Exam  GENERAL:  83 y.o.-year-old patient lying in bed in no acute distress.  EYES: Pupils equal, round, reactive to light and accommodation. No scleral icterus. Extraocular muscles intact.  HEENT: Head atraumatic, normocephalic. Oropharynx and nasopharynx clear.  NECK:  Supple, no jugular venous distention. No thyroid enlargement, no tenderness.  LUNGS: Normal breath sounds bilaterally, no wheezing, rales, rhonchi. No use of accessory muscles of respiration.  CARDIOVASCULAR: S1, S2 normal. No murmurs, rubs, or gallops.  ABDOMEN: Soft, nontender, nondistended. Bowel sounds present. No organomegaly or mass.  EXTREMITIES: No cyanosis, clubbing or edema b/l.    NEUROLOGIC: Cranial nerves II through XII are intact. No focal Motor or sensory deficits b/l.  Globally weak.  PSYCHIATRIC: The patient is alert and oriented x 3.  SKIN: No obvious rash, lesion, or ulcer.    LABORATORY PANEL:   CBC Recent Labs  Lab 03/23/18 0319 03/23/18 1337  WBC 4.9  --   HGB 7.7* 8.0*  HCT 22.7*  --   PLT 181  --    ------------------------------------------------------------------------------------------------------------------  Chemistries  Recent Labs  Lab 03/22/18 1549 03/23/18 0319  NA 141 143  K 3.6 3.7  CL 109 113*  CO2 22 24  GLUCOSE 196* 118*  BUN 14 13  CREATININE 0.97 0.72  CALCIUM 8.1* 8.0*  AST 35  --   ALT 12  --   ALKPHOS 35*  --   BILITOT 0.7  --    ------------------------------------------------------------------------------------------------------------------  Cardiac Enzymes Recent Labs  Lab 03/22/18 1549  TROPONINI <0.03   ------------------------------------------------------------------------------------------------------------------  RADIOLOGY:  No results found.   ASSESSMENT AND PLAN:    83 year old female with past medical  history of polymyalgia rheumatica, hypertension, hypothyroidism, history of abdominal aortic aneurysm, uterine prolapse, previous history of MI who presents to the hospital due to rectal bleeding.  1.  GI bleed- patient presents to the hospital due to multiple episodes of rectal bleeding which is likely a lower GI bleed. - Questionable if it is diverticular or related to hemorrhoids.  Patient had a large bloody bowel movement today.  Hemoglobin is stable posttransfusion we will continue to monitor. -Seen by gastroenterology and they think this is likely hemorrhoidal as patient has been having bloody stools with almost every bowel movement.  Will get surgical consult  2.  Acute blood loss anemia-secondary to lower GI bleed. - Patient has been transfused 1 unit of packed red blood cells and hemoglobin improved posttransfusion we will continue to monitor.  3.  Hypothyroidism-continue Synthroid.  4.  Diabetes type 2 without complication-continue sliding scale insulin.  5.  Depression-continue Cymbalta.  6.  GERD-continue Protonix.  7.  Hyperlipidemia-continue Crestor.  8.  History of polymyalgia rheumatica-continue prednisone.   All the records are reviewed and case discussed with Care Management/Social Worker. Management plans discussed with the patient, family and they are in agreement.  CODE STATUS: Full code  DVT Prophylaxis: Ted's & SCD's.   TOTAL TIME TAKING CARE OF THIS PATIENT: 30 minutes.   POSSIBLE D/C IN 1-2 DAYS, DEPENDING ON CLINICAL CONDITION.   [place] SirenSAINANI,VIVEK J M.D on 03/23/2018 at 2:21 PM  Between 7am to 6pm - Pager - [phone]  After 6pm go to [email or web address] - Social research officer, governmentpassword EPAS [facility]  [facility] [place] [facility]  Office  [phone]  CC: Primary care physician; Suzzanne CloudLarson, Brittany Marie, MD

## 2018-03-24 LAB — GLUCOSE, CAPILLARY
GLUCOSE-CAPILLARY: 109 mg/dL — AB (ref 70–99)
GLUCOSE-CAPILLARY: 225 mg/dL — AB (ref 70–99)
Glucose-Capillary: 154 mg/dL — ABNORMAL HIGH (ref 70–99)
Glucose-Capillary: 173 mg/dL — ABNORMAL HIGH (ref 70–99)

## 2018-03-24 LAB — CBC
HEMATOCRIT: 22.2 % — AB (ref 35.0–47.0)
Hemoglobin: 7.5 g/dL — ABNORMAL LOW (ref 12.0–16.0)
MCH: 34.1 pg — AB (ref 26.0–34.0)
MCHC: 33.6 g/dL (ref 32.0–36.0)
MCV: 101.5 fL — ABNORMAL HIGH (ref 80.0–100.0)
PLATELETS: 180 10*3/uL (ref 150–440)
RBC: 2.19 MIL/uL — AB (ref 3.80–5.20)
RDW: 19.7 % — ABNORMAL HIGH (ref 11.5–14.5)
WBC: 4.4 10*3/uL (ref 3.6–11.0)

## 2018-03-24 NOTE — Clinical Social Work Note (Addendum)
CSW has contacted [facility] to find out if [employer] authorization has been received for the patient to return. Amy Freida BusmanAllen Building control surveyor(RN supervisor) is contacting the business office and will update the CSW as soon as she find out.    UPDATE: The facility is still waiting for insurance authorization from [employer] and cannot accept the patient back until that has been received. CSW is following and will enter assessment when able.  Argentina PonderKaren Martha Tyaisha Cullom, MSW, Theresia MajorsLCSWA [phone]

## 2018-03-24 NOTE — Progress Notes (Signed)
Patient reports no further bleeding.  She did have a bowel movement today that was small but no blood.  She is no longer dizzy.  Vital signs reviewed and stable Abdomen soft distended but nontender I/Os reviewed  Slight drop in hemoglobin.  Total signs stable  Likely bleeding from hemorrhoids.  She is only had one dose of the hydrocortisone suppository.  She has no further bleeding but her hemoglobin dropped slightly.  No active bleeding at this time and Plavix effect is still present.  No surgical intervention planned at this time.

## 2018-03-24 NOTE — Progress Notes (Signed)
[facility] - [place] at [facility]   PATIENT NAME: Margaret Castillo    MR#:  [id number]  DATE OF BIRTH:  10-14-1934  SUBJECTIVE:   Patient's hemoglobin stable, still having some intermittent bleeding with some of her bowel movements.  Thought to be related to hemorrhoids, seen by surgery and started on Anusol suppositories.  REVIEW OF SYSTEMS:    Review of Systems  Constitutional: Negative for chills and fever.  HENT: Negative for congestion and tinnitus.   Eyes: Negative for blurred vision and double vision.  Respiratory: Negative for cough, shortness of breath and wheezing.   Cardiovascular: Negative for chest pain, orthopnea and PND.  Gastrointestinal: Positive for blood in stool. Negative for abdominal pain, diarrhea, nausea and vomiting.  Genitourinary: Negative for dysuria and hematuria.  Neurological: Negative for dizziness, sensory change and focal weakness.  All other systems reviewed and are negative.   Nutrition: Soft diet Tolerating Diet: Yes Tolerating PT:  Await Eval.   DRUG ALLERGIES:   Allergies  Allergen Reactions  . Atorvastatin Other (See Comments)    MUSCLE PAIN (all statins)  . Codeine Nausea Only  . Crestor [Rosuvastatin] Other (See Comments)    Leg pain/ joint pain  ( ALL THE STATIN's )  . Penicillins Rash    Has patient had a PCN reaction causing immediate rash, facial/tongue/throat swelling, SOB or lightheadedness with hypotension: Yes Has patient had a PCN reaction causing severe rash involving mucus membranes or skin necrosis: No Has patient had a PCN reaction that required hospitalization No Has patient had a PCN reaction occurring within the last 10 years: No If all of the above answers are "NO", then may proceed with Cephalosporin use.   . Adhesive [Tape] Other (See Comments)  . Cephalexin Rash    VITALS:  Blood pressure 127/75, pulse 72, temperature 98.1 F (36.7 C), temperature source Oral, resp. rate 16, height 5\' 5"   (1.651 m), weight 88.7 kg (195 lb 9.6 oz), SpO2 97 %.  PHYSICAL EXAMINATION:   Physical Exam  GENERAL:  83 y.o.-year-old patient lying in bed in no acute distress.  EYES: Pupils equal, round, reactive to light and accommodation. No scleral icterus. Extraocular muscles intact.  HEENT: Head atraumatic, normocephalic. Oropharynx and nasopharynx clear.  NECK:  Supple, no jugular venous distention. No thyroid enlargement, no tenderness.  LUNGS: Normal breath sounds bilaterally, no wheezing, rales, rhonchi. No use of accessory muscles of respiration.  CARDIOVASCULAR: S1, S2 normal. No murmurs, rubs, or gallops.  ABDOMEN: Soft, nontender, nondistended. Bowel sounds present. No organomegaly or mass.  EXTREMITIES: No cyanosis, clubbing, + 1 edema b/l.  NEUROLOGIC: Cranial nerves II through XII are intact. No focal Motor or sensory deficits b/l.  Globally weak.  PSYCHIATRIC: The patient is alert and oriented x 3.  SKIN: No obvious rash, lesion, or ulcer.    LABORATORY PANEL:   CBC Recent Labs  Lab 03/24/18 0422  WBC 4.4  HGB 7.5*  HCT 22.2*  PLT 180   ------------------------------------------------------------------------------------------------------------------  Chemistries  Recent Labs  Lab 03/22/18 1549 03/23/18 0319  NA 141 143  K 3.6 3.7  CL 109 113*  CO2 22 24  GLUCOSE 196* 118*  BUN 14 13  CREATININE 0.97 0.72  CALCIUM 8.1* 8.0*  AST 35  --   ALT 12  --   ALKPHOS 35*  --   BILITOT 0.7  --    ------------------------------------------------------------------------------------------------------------------  Cardiac Enzymes Recent Labs  Lab 03/22/18 1549  TROPONINI <0.03   ------------------------------------------------------------------------------------------------------------------  RADIOLOGY:  No results found.   ASSESSMENT AND PLAN:    83 year old female with past medical history of polymyalgia rheumatica, hypertension, hypothyroidism, history of  abdominal aortic aneurysm, uterine prolapse, previous history of MI who presents to the hospital due to rectal bleeding.  1.  GI bleed- patient presents to the hospital due to multiple episodes of rectal bleeding which is likely a lower GI bleed. - This is thought to be secondary to hemorrhoids.  Seen by both general surgery and also gastroenterology.  No plans for acute intervention. - Surgery has started on some Anusol suppositories and will continue that for now.  Hemoglobin currently stable.  No acute need for endoscopic evaluation or surgical intervention. -Patient tolerating a soft diet well.  2.  Acute blood loss anemia-secondary to lower GI bleed. - She was transfused 1 unit packed red blood cells hemoglobin has improved.  Remained stable.  Will transfuse if hemoglobin falls below 7.  Continue iron supplements for now.  3.  Hypothyroidism-continue Synthroid.  4.  Diabetes type 2 without complication-continue sliding scale insulin.  5.  Depression-continue Cymbalta.  6.  GERD-continue Protonix.  7.  Hyperlipidemia-continue Crestor.  8.  History of polymyalgia rheumatica-continue prednisone.  Discussed with social work and patient needs new insurance authorization prior to [facility].  This is likely not to happen until early next week.  All the records are reviewed and case discussed with Care Management/Social Worker. Management plans discussed with the patient, family and they are in agreement.  CODE STATUS: Full code  DVT Prophylaxis: Ted's & SCD's.   TOTAL TIME TAKING CARE OF THIS PATIENT: 25 minutes.   POSSIBLE D/C IN 2 DAYS, DEPENDING ON CLINICAL CONDITION.   [place] SirenSAINANI,Novalee Horsfall J M.D on 03/24/2018 at 11:59 AM  Between 7am to 6pm - Pager - [phone]  After 6pm go to [email or web address] - Social research officer, governmentpassword EPAS [facility]  [facility] [place] [facility]  Office  [phone]  CC: Primary care physician; Suzzanne CloudLarson, Brittany Marie, MD

## 2018-03-24 NOTE — Progress Notes (Signed)
Noted Dr Excell Seltzerooper following , likely needs surgical rx for her hemorroids when plavix off her sytem in 5 days .   I will sign off.  Please call me if any further GI concerns or questions.  We would like to thank you for the opportunity to participate in the care of Margaret Castillo.

## 2018-03-24 NOTE — Clinical Social Work Note (Signed)
Clinical Social Work Assessment  Patient Details  Name: Margaret Castillo MRN: [id number] Date of Birth: March 23, 1935  Date of referral:  03/24/18               Reason for consult:  Facility Placement                Permission sought to share information with:  Chartered certified accountant granted to share information::  Yes, Verbal Permission Granted  Name::        Agency::  [facility]  Relationship::     Contact Information:     Housing/Transportation Living arrangements for the past 2 months:  [place] of Information:  Patient, Medical Team, Adult Children Patient Interpreter Needed:  None Criminal Activity/Legal Involvement Pertinent to Current Situation/Hospitalization:  No - Comment as needed Significant Relationships:  Adult Children, Community Support Lives with:  Adult Children Do you feel safe going back to the place where you live?  Yes Need for family participation in patient care:  No (Coment)  Care giving concerns:  Patient admitted from [place] Worker assessment / plan:  The CSW met with the patient at bedside to discuss discharge planning. The patient's son joined the discussion by phone. The CSW explained that [facility] will accept her back at the facility; however, the reauthorization for care from [place] is pending and would not be available over the weekend. The patient and her son voiced that they understood.  The patient is not safe to discharge home while waiting for the authorization, and none of the area SNFs (including [facility]) are willing to accept a letter of guarantee while the authorization is pending. The patient will remain until Monday when the auth should be available. At the time of discharge, the patient's family plans to transport. CSW will continue to follow for discharge facilitation.  Employment status:  Retired Nurse, adult PT Recommendations:  [place] / Referral to community resources:  [place]  Patient/Family's Response to care:  The patient and her son were disappointed but understood the barrier.  Patient/Family's Understanding of and Emotional Response to Diagnosis, Current Treatment, and Prognosis:  The patient and her son understand the insurance barrier and the need for safe discharge. They are both in agreement with return to [facility].  Emotional Assessment Appearance:  Appears stated age Attitude/Demeanor/Rapport:  Gracious Affect (typically observed):  Anxious, Pleasant Orientation:  Oriented to Self, Oriented to Place, Oriented to  Time, Oriented to Situation Alcohol / Substance use:  Never Used Psych involvement (Current and /or in the community):  No (Comment)  Discharge Needs  Concerns to be addressed:  Care Coordination, Discharge Planning Concerns Readmission within the last 30 days:  No Current discharge risk:  Chronically ill Barriers to Discharge:  Continued Medical Work up   [employer], LCSW 03/24/2018, 1:59 PM

## 2018-03-24 NOTE — [place] FL2 (Signed)
[place] MEDICAID FL2 LEVEL OF CARE SCREENING TOOL     IDENTIFICATION  Patient Name: Margaret Castillo Birthdate: May 21, 1935 Sex: female Admission Date (Current Location): 03/22/2018  [place]   Facility and Address:  [facility], [address], [place], [place] [address]      Provider Number: [id number]  Attending Physician Name and Address:  Houston SirenSainani, Vivek J, MD  Relative Name and Phone Number:  Barnet GlasgowBryan Nagorski [facility](Son) [phone] or [phone]    Current Level of Care: Hospital Recommended Level of Care: Skilled Nursing Facility Prior Approval Number:    Date Approved/Denied:   PASRR Number: [id number]  Discharge Plan: SNF    Current Diagnoses: Patient Active Problem List   Diagnosis Date Noted  . GIB (gastrointestinal bleeding) 03/22/2018  . Near syncope   . Polymyalgia rheumatica (HCC) 06/23/2017  . Symptomatic hypotension 06/23/2017  . Prolapsed internal hemorrhoids, grade 2 and 3   . Rectal bleeding 09/27/2016  . Acute blood loss anemia   . AAA (abdominal aortic aneurysm) (HCC) 02/29/2016  . Occlusion and stenosis of carotid artery without mention of cerebral infarction 08/09/2012    Orientation RESPIRATION BLADDER Height & Weight     Self, Time, Situation, Place  Normal Continent Weight: 195 lb 9.6 oz (88.7 kg) Height:  5\' 5"  (165.1 cm)  BEHAVIORAL SYMPTOMS/MOOD NEUROLOGICAL BOWEL NUTRITION STATUS      Continent Diet(Soft)  AMBULATORY STATUS COMMUNICATION OF NEEDS Skin   Extensive Assist Verbally Normal                       Personal Care Assistance Level of Assistance  Bathing, Dressing, Feeding Bathing Assistance: Limited assistance Feeding assistance: Independent Dressing Assistance: Limited assistance     Functional Limitations Info  Sight, Hearing, Speech Sight Info: Adequate Hearing Info: Adequate Speech Info: Adequate    SPECIAL CARE FACTORS FREQUENCY  PT (By licensed PT)     PT Frequency: Up to 5X per week              Contractures Contractures Info: Not present    Additional Factors Info  Code Status, Allergies Code Status Info: Full Allergies Info: Atorvastatin, Codeine, Crestor Rosuvastatin, Penicillins, Adhesive Tape, Cephalexin           Current Medications (03/24/2018):  This is the current hospital active medication list Current Facility-Administered Medications  Medication Dose Route Frequency Provider Last Rate Last Dose  . acetaminophen (TYLENOL) tablet 650 mg  650 mg Oral Q6H PRN Cammy CopaMaier, Angela, MD   650 mg at 03/24/18 0220   Or  . acetaminophen (TYLENOL) suppository 650 mg  650 mg Rectal Q6H PRN Cammy CopaMaier, Angela, MD      . alum & mag hydroxide-simeth (MAALOX/MYLANTA) 200-200-20 MG/5ML suspension 30 mL  30 mL Oral Q4H PRN Cammy CopaMaier, Angela, MD   30 mL at 03/23/18 0118  . bisacodyl (DULCOLAX) EC tablet 5 mg  5 mg Oral Daily PRN Cammy CopaMaier, Angela, MD      . docusate sodium (COLACE) capsule 100 mg  100 mg Oral BID Cammy CopaMaier, Angela, MD   100 mg at 03/24/18 0909  . DULoxetine (CYMBALTA) DR capsule 20 mg  20 mg Oral Daily Cammy CopaMaier, Angela, MD   20 mg at 03/24/18 0910  . ferrous sulfate tablet 325 mg  325 mg Oral Q breakfast Cammy CopaMaier, Angela, MD   325 mg at 03/24/18 0910  . HYDROcodone-acetaminophen (NORCO/VICODIN) 5-325 MG per tablet 1-2 tablet  1-2 tablet Oral Q4H PRN  Cammy Copa, MD      . hydrocortisone [facility]) suppository 25 mg  25 mg Rectal BID Lattie Haw, MD   25 mg at 03/24/18 0910  . insulin aspart (novoLOG) injection 0-5 Units  0-5 Units Subcutaneous QHS Sainani, Vivek J, MD      . insulin aspart (novoLOG) injection 0-9 Units  0-9 Units Subcutaneous TID WC Houston Siren, MD   2 Units at 03/24/18 1156  . levothyroxine (SYNTHROID, LEVOTHROID) tablet 100 mcg  100 mcg Oral Once per day on Sun Tue Thu Fri Sat Houston Siren, MD   100 mcg at 03/24/18 4098  . multivitamin with minerals tablet 1 tablet  1 tablet Oral Daily Cammy Copa, MD   1  tablet at 03/24/18 0910  . ondansetron (ZOFRAN) tablet 4 mg  4 mg Oral Q6H PRN Cammy Copa, MD       Or  . ondansetron [facility]) injection 4 mg  4 mg Intravenous Q6H PRN Cammy Copa, MD      . pantoprazole (PROTONIX) EC tablet 40 mg  40 mg Oral Daily Cammy Copa, MD   40 mg at 03/24/18 0910  . predniSONE (DELTASONE) tablet 30 mg  30 mg Oral Q breakfast Cammy Copa, MD   30 mg at 03/24/18 0910  . rosuvastatin (CRESTOR) tablet 5 mg  5 mg Oral QHS Cammy Copa, MD   5 mg at 03/23/18 2206  . traZODone (DESYREL) tablet 25 mg  25 mg Oral QHS PRN Cammy Copa, MD      . Vitamin D (Ergocalciferol) (DRISDOL) capsule 50,000 Units  50,000 Units Oral Q14 Days Houston Siren, MD   50,000 Units at 03/24/18 0910     Discharge Medications: Please see discharge summary for a list of discharge medications.  Relevant Imaging Results:  Relevant Lab Results:   Additional Information SS# [id number]  Judi Cong, LCSW

## 2018-03-25 LAB — GLUCOSE, CAPILLARY
GLUCOSE-CAPILLARY: 146 mg/dL — AB (ref 70–99)
GLUCOSE-CAPILLARY: 265 mg/dL — AB (ref 70–99)
Glucose-Capillary: 89 mg/dL (ref 70–99)
Glucose-Capillary: 178 mg/dL — ABNORMAL HIGH (ref 70–99)

## 2018-03-25 LAB — HEMOGLOBIN: HEMOGLOBIN: 8.2 g/dL — AB (ref 12.0–16.0)

## 2018-03-25 NOTE — Plan of Care (Signed)
Patient resting in the bed at thus time, no signs  and symptoms of abdominal bleeding , no distress noted  Problem: Clinical Measurements: Goal: Will remain free from infection Outcome: Progressing   Problem: Pain Managment: Goal: General experience of comfort will improve Outcome: Progressing   Problem: Bowel/Gastric: Goal: Will show no signs and symptoms of gastrointestinal bleeding Outcome: Progressing

## 2018-03-25 NOTE — Progress Notes (Signed)
Patient seen with nursing.  She states she is having no further bleeding no abdominal pain she has had a bowel movement without blood.  Vital signs are stable Abdomen soft and nontender Awake alert and oriented  No hemoglobin or hematocrit was drawn this morning Patient could be discharged if her hemoglobin is stable but I understand [facility] is to [facility] and that cannot be done until tomorrow.  No further surgical needs at this time.  Reconsult as necessary.

## 2018-03-25 NOTE — Progress Notes (Signed)
[facility] -  at [facility]   PATIENT NAME: Margaret Castillo    MR#:  [id number]  DATE OF BIRTH:  [birth year]  SUBJECTIVE:   No acute bleeding overnight, hemoglobin remained stable.  Patient has no complaints.  REVIEW OF SYSTEMS:    Review of Systems  Constitutional: Negative for chills and fever.  HENT: Negative for congestion and tinnitus.   Eyes: Negative for blurred vision and double vision.  Respiratory: Negative for cough, shortness of breath and wheezing.   Cardiovascular: Negative for chest pain, orthopnea and PND.  Gastrointestinal: Negative for abdominal pain, blood in stool, diarrhea, nausea and vomiting.  Genitourinary: Negative for dysuria and hematuria.  Neurological: Negative for dizziness, sensory change and focal weakness.  All other systems reviewed and are negative.   Nutrition: Soft diet Tolerating Diet: Yes Tolerating PT:  Await Eval.   DRUG ALLERGIES:   Allergies  Allergen Reactions  . Atorvastatin Other (See Comments)    MUSCLE PAIN (all statins)  . Codeine Nausea Only  . Crestor [Rosuvastatin] Other (See Comments)    Leg pain/ joint pain  ( ALL THE STATIN's )  . Penicillins Rash    Has patient had a PCN reaction causing immediate rash, facial/tongue/throat swelling, SOB or lightheadedness with hypotension: Yes Has patient had a PCN reaction causing severe rash involving mucus membranes or skin necrosis: No Has patient had a PCN reaction that required hospitalization No Has patient had a PCN reaction occurring within the last 10 years: No If all of the above answers are "NO", then may proceed with Cephalosporin use.   . Adhesive [Tape] Other (See Comments)  . Cephalexin Rash    VITALS:  Blood pressure (!) 163/83, pulse 72, temperature 97.7 F (36.5 C), temperature source Oral, resp. rate (!) 24, height 5\' 5"  (1.651 m), weight 90.6 kg (199 lb 11.8 oz), SpO2 96 %.  PHYSICAL EXAMINATION:   Physical Exam  GENERAL:  83  y.o.-year-old patient lying in bed in no acute distress.  EYES: Pupils equal, round, reactive to light and accommodation. No scleral icterus. Extraocular muscles intact.  HEENT: Head atraumatic, normocephalic. Oropharynx and nasopharynx clear.  NECK:  Supple, no jugular venous distention. No thyroid enlargement, no tenderness.  LUNGS: Normal breath sounds bilaterally, no wheezing, rales, rhonchi. No use of accessory muscles of respiration.  CARDIOVASCULAR: S1, S2 normal. No murmurs, rubs, or gallops.  ABDOMEN: Soft, nontender, nondistended. Bowel sounds present. No organomegaly or mass.  EXTREMITIES: No cyanosis, clubbing, + 1 edema b/l.  NEUROLOGIC: Cranial nerves II through XII are intact. No focal Motor or sensory deficits b/l.  Globally weak.  PSYCHIATRIC: The patient is alert and oriented x 3.  SKIN: No obvious rash, lesion, or ulcer.    LABORATORY PANEL:   CBC Recent Labs  Lab 03/24/18 0422 03/25/18 0949  WBC 4.4  --   HGB 7.5* 8.2*  HCT 22.2*  --   PLT 180  --    ------------------------------------------------------------------------------------------------------------------  Chemistries  Recent Labs  Lab 03/22/18 1549 03/23/18 0319  NA 141 143  K 3.6 3.7  CL 109 113*  CO2 22 24  GLUCOSE 196* 118*  BUN 14 13  CREATININE 0.97 0.72  CALCIUM 8.1* 8.0*  AST 35  --   ALT 12  --   ALKPHOS 35*  --   BILITOT 0.7  --    ------------------------------------------------------------------------------------------------------------------  Cardiac Enzymes Recent Labs  Lab 03/22/18 1549  TROPONINI <0.03   ------------------------------------------------------------------------------------------------------------------  RADIOLOGY:  No results found.  ASSESSMENT AND PLAN:    83 year old female with past medical history of polymyalgia rheumatica, hypertension, hypothyroidism, history of abdominal aortic aneurysm, uterine prolapse, previous history of MI who presents  to the hospital due to rectal bleeding.  1.  GI bleed- patient presents to the hospital due to multiple episodes of rectal bleeding which is likely a lower GI bleed. - This is thought to be secondary to hemorrhoids.  Seen by both general surgery and also gastroenterology.  No plans for acute intervention. - Patient is on Anusol suppositories and has had no further bleeding.  Hemoglobin remained stable.  Tolerating a soft diet well.  2.  Acute blood loss anemia-secondary to lower GI bleed. - She was transfused 1 unit packed red blood cells hemoglobin has improved.  Hg Remained stable.  Will transfuse if hemoglobin falls below 7.  Continue iron supplements for now.  3.  Hypothyroidism-continue Synthroid.  4.  Diabetes type 2 without complication-continue sliding scale insulin. -Blood sugar stable  5.  Depression-continue Cymbalta.  6.  GERD-continue Protonix.  7.  Hyperlipidemia-continue Crestor.  8.  History of polymyalgia rheumatica-continue prednisone.  Discussed with social work and patient needs new insurance authorization [facility].  Likely discharge back to skilled nursing facility tomorrow.  All the records are reviewed and case discussed with Care Management/Social Worker. Management plans discussed with the patient, family and they are in agreement.  CODE STATUS: Full code  DVT Prophylaxis: Ted's & SCD's.   TOTAL TIME TAKING CARE OF THIS PATIENT: 25 minutes.   POSSIBLE D/C tomorrow , DEPENDING ON CLINICAL CONDITION.   Houston SirenSAINANI,Takela Varden J M.D on 03/25/2018 at 12:35 PM  Between 7am to 6pm - Pager - [phone]  After 6pm go to [email or web address] - Social research officer, governmentpassword [email or web address]  [facility]  Office  [phone]  CC: Primary care physician; Suzzanne CloudLarson, Brittany Marie, MD

## 2018-03-26 LAB — GLUCOSE, CAPILLARY
GLUCOSE-CAPILLARY: 118 mg/dL — AB (ref 70–99)
GLUCOSE-CAPILLARY: 197 mg/dL — AB (ref 70–99)

## 2018-03-26 LAB — CBC
HEMATOCRIT: 23.9 % — AB (ref 35.0–47.0)
Hemoglobin: 8.2 g/dL — ABNORMAL LOW (ref 12.0–16.0)
MCH: 35.4 pg — ABNORMAL HIGH (ref 26.0–34.0)
MCHC: 34.4 g/dL (ref 32.0–36.0)
MCV: 103.1 fL — ABNORMAL HIGH (ref 80.0–100.0)
PLATELETS: 194 10*3/uL (ref 150–440)
RBC: 2.32 MIL/uL — ABNORMAL LOW (ref 3.80–5.20)
RDW: 19.8 % — AB (ref 11.5–14.5)
WBC: 4.9 10*3/uL (ref 3.6–11.0)

## 2018-03-26 MED ORDER — HYDROCORTISONE ACETATE 25 MG RE SUPP: 25.0000 mg | Freq: Two times a day (BID) | RECTAL | 0 refills | Status: DC

## 2018-03-26 MED ORDER — ALUM & MAG HYDROXIDE-SIMETH 200-200-20 MG/5ML PO SUSP: 30.0000 mL | ORAL | 0 refills | Status: DC | PRN

## 2018-03-26 MED ORDER — DOCUSATE SODIUM 100 MG PO CAPS: 100.0000 mg | ORAL_CAPSULE | Freq: Two times a day (BID) | ORAL | 0 refills | Status: DC

## 2018-03-26 NOTE — Care Management Note (Signed)
Case Management Note  Patient Details  Name: Margaret Castillo MRN: [id number] Date of Birth: Dec 27, 1934   Patient to discharge today.  RN CM spoke with son Margaret Castillo via phone.  He state that instead of waiting on insurance authorization he is taking his mother home health home health.  Son states there is a home health agency that already has orders one her, however he can not recall the agency.  PCP Cyril MourningLarson.  Pharmacy [facility].  Son states that he lives in the home with the patient and provides transportation.  Patient has a rollator, BSC, and elevated toilet set in the home. Son provides transportation.    RNCM [facility], and [facility]  [facility] notified of [facility].  Grandson to transport at discharge.   Subjective/Objective:                    Action/Plan:   Expected Discharge Date:  03/26/18               Expected Discharge Plan:  Home w Home Health Services  In-House Referral:     Discharge planning Services  CM Consult  Post Acute Care Choice:  Home Health Choice offered to:  Adult Children  DME Arranged:    DME Agency:     HH Arranged:  RN, PT, Nurse's Aide, Social Work [employer] Agency:  [facility]  Status of Service:  Completed, signed off  If discussed at [employer] Meetings, dates discussed:    Additional Comments:  Chapman FitchBOWEN, Jolleen Seman T, RN 03/26/2018, 4:44 PM

## 2018-03-26 NOTE — Progress Notes (Signed)
Physical Therapy Treatment Patient Details Name: Margaret Castillo MRN: [id number] DOB: 12-08-1934 Today's Date: 03/26/2018    History of Present Illness Patient is 83 yo female presented to ED from [facility] due to Hgb of 6.5. PMH of AAA, anemia, arthritis, carotid artery stenosis, endovascular aneurysm repair, MI, polymyalgia rheumatica, hemorrhoids.     PT Comments    Patient alert and agreeable to PT at start of session, visitor at bedside. Vitals stable while lying in bed. Patient demonstrated good tolerance to exercises with minimal cues needed for technique. Bed mobility with close supervision, sit <> stand x2 this session, CGA and RW. Patient ambulated ~27ft during session, 1 sitting rest break after 66ft. Patient fatigued and mildly SOB after ambulation, exhibited good knowledge of pursed lip breathing. Patient up in chair at end of session, in NAD, all needs in reach. The patient would benefit from continued PT to address deficits in endurance, activity tolerance, strength, gait and mobility.      Follow Up Recommendations  SNF     Equipment Recommendations  Other (comment)(TBD at next venue of care, patient has 4WW and cane)    Recommendations for Other Services       Precautions / Restrictions Precautions Precautions: Fall Restrictions Weight Bearing Restrictions: No    Mobility  Bed Mobility Overal bed mobility: Needs Assistance Bed Mobility: Supine to Sit     Supine to sit: Supervision;HOB elevated     General bed mobility comments: extended time to maximize independence  Transfers Overall transfer level: Needs assistance Equipment used: Rolling walker (2 wheeled) Transfers: Sit to/from Stand Sit to Stand: Min guard         General transfer comment: x2 this session  Ambulation/Gait Ambulation/Gait assistance: Min guard Gait Distance (Feet): 16 Feet Assistive device: Rolling walker (2 wheeled)       General Gait Details: decreased speed, SOB  noted at end of activity, occasional cues for AD management   Stairs             Wheelchair Mobility    Modified Rankin (Stroke Patients Only)       Balance Overall balance assessment: Needs assistance Sitting-balance support: Feet supported Sitting balance-Leahy Scale: Good       Standing balance-Leahy Scale: Poor                              Cognition Arousal/Alertness: Awake/alert Behavior During Therapy: WFL for tasks assessed/performed Overall Cognitive Status: Within Functional Limits for tasks assessed                                        Exercises General Exercises - Lower Extremity Ankle Circles/Pumps: AROM;Both;20 reps Quad Sets: AROM;Both;20 reps Gluteal Sets: AROM;Both;20 reps Long Arc Quad: AROM;Both;20 reps    General Comments        Pertinent Vitals/Pain Pain Assessment: No/denies pain    Home Living                      Prior Function            PT Goals (current goals can now be found in the care plan section) Progress towards PT goals: Progressing toward goals    Frequency    Min 2X/week      PT Plan Current plan remains appropriate    Co-evaluation  AM-PAC PT "6 Clicks" Daily Activity  Outcome Measure  Difficulty turning over in bed (including adjusting bedclothes, sheets and blankets)?: A Little Difficulty moving from lying on back to sitting on the side of the bed? : A Little Difficulty sitting down on and standing up from a chair with arms (e.g., wheelchair, bedside commode, etc,.)?: A Little Help needed moving to and from a bed to chair (including a wheelchair)?: A Little Help needed walking in hospital room?: A Little Help needed climbing 3-5 steps with a railing? : Total 6 Click Score: 16    End of Session Equipment Utilized During Treatment: Gait belt Activity Tolerance: Patient tolerated treatment well Patient left: in chair;with chair alarm set;Other  (comment);with call bell/phone within reach(Patient had called nursing staff at end of session) Nurse Communication: Mobility status PT Visit Diagnosis: Unsteadiness on feet (R26.81);Other abnormalities of gait and mobility (R26.89);Muscle weakness (generalized) (M62.81)     Time: 0900-0930 PT Time Calculation (min) (ACUTE ONLY): 30 min  Charges:  $Therapeutic Exercise: 8-22 mins $Therapeutic Activity: 8-22 mins                     Olga Coasteriana Aleeza [place] PT, DPT 9:48 AM,03/26/18 [phone]

## 2018-03-26 NOTE — Progress Notes (Signed)
.  Discharge order received. Patient is alert and oriented. Vital signs stable . No signs of acute distress. Discharge instructions given to grandson. Patient and grandson verbalized understanding. No other issues noted at this time.

## 2018-03-26 NOTE — Clinical Social Work Note (Signed)
CSW contacted [facility], they have not received insurance authorization yet.  Case manager spoke with patient's son Judie GrieveBryan, and he would rather have patient return home with home health instead of waiting for insurance authorization, CSW [facility] a [facility] admission worker's voice mail.  CSW to sign off, please reconsult if social work needs arise.  Ervin KnackEric R. Hassan Rowannterhaus, MSW, Theresia MajorsLCSWA [phone]  03/26/2018 5:37 PM

## 2018-03-26 NOTE — Care Management Important Message (Signed)
Copy of signed IM left with patient in room.  

## 2018-03-26 NOTE — Discharge Instructions (Signed)
Follow-up with primary care physician in 3 days Follow-up with gastroenterology as needed Follow-up with Dr. Excell Seltzerooper in 1 week

## 2018-03-26 NOTE — Discharge Summary (Addendum)
[facility] [place] at [facility]   PATIENT NAME: Margaret BinderLouise Castillo    MR#:  [id number]  DATE OF BIRTH:  [birth year]  DATE OF ADMISSION:  03/22/2018 ADMITTING PHYSICIAN: Cammy CopaAngela Maier, MD  DATE OF DISCHARGE: 03/26/2018  PRIMARY CARE PHYSICIAN: Suzzanne CloudLarson, Brittany Marie, MD    ADMISSION DIAGNOSIS:  Rectal bleeding [K62.5] Anemia, unspecified type [D64.9]  DISCHARGE DIAGNOSIS:  Active Problems:   GIB (gastrointestinal bleeding) Hemorrhoids gen weakness  SECONDARY DIAGNOSIS:   Past Medical History:  Diagnosis Date  . AAA (abdominal aortic aneurysm) (HCC)   . Anemia   . Arthritis    Neck  . Bladder prolapse, female, acquired   . Carotid artery occlusion   . Endoleak post endovascular aneurysm repair [facility](HCC), Type I 02/2016   [facility] for aortic cuff placement  . Hearing aid worn   . Hypothyroidism   . Myocardial infarction (HCC) 1982  . Polymyalgia rheumatica (HCC)   . Prolapsed internal hemorrhoids, grade 3   . Shingles   . Uterine prolapse     HOSPITAL COURSE:    HISTORY OF PRESENT ILLNESS: Margaret Castillo  is a 83 y.o. female, nursing home resident, with a known history of AAA, anemia of chronic disease, PR, CAD, hemorrhoids and other comorbidities. [facility] due to low hemoglobin level for blood test done there.  Patient complains of episodes of rectal bleeding with bright red blood per rectum going on for the past 24 hours.  She denies any abdominal pain, but she complains of generalized weakness and fatigue.  Patient recalls she had a colonoscopy approximately a year ago and she was told she had hemorrhoids. Blood test done emergency room are remarkable for hemoglobin level at 7.  The reminder of CBC and CMP are unremarkable. Patient is admitted for further evaluation and treatment.  1.  GI bleed- patient presents to the hospital due to multiple episodes of rectal bleeding which is likely a lower  GI bleed. - This is thought to be secondary to hemorrhoids.  Seen by both general surgery and also gastroenterology.  No plans for acute intervention. - Patient is on Anusol suppositories and has had no further bleeding.  Hemoglobin remained stable at 8.2 tolerating a soft diet well. -Encouraged to eat high-fiber diet -Outpatient follow-up with Dr. Excell Seltzerooper for possible hemorrhoidectomy  2.  Acute blood loss anemia-secondary to lower GI bleed. - She was transfused 1 unit packed red blood cells hemoglobin has improved.  Hg Remained stable.  Will transfuse if hemoglobin falls below 7.  Continue iron supplements for now. -Hb is stable at 8.2  3.  Hypothyroidism-continue Synthroid.  4.  Diabetes type 2 without complication-continue sliding scale insulin. -Blood sugar stable  5.  Depression-continue Cymbalta.  6.  GERD-continue Protonix.  7.  Hyperlipidemia-continue Crestor.  8.  History of polymyalgia rheumatica-continue prednisone.  9.  Generalized weakness-physical therapy recommending skilled nursing facility, still waiting for insurance authorization.  I have discussed at length with patient and her son Mr. Arlys JohnBrian over phone in detail.  Arlys JohnBrian would rather arrange caregivers at home and requesting home health to take mom home.  She is already has walker.  Refusing placement at this time as insurance authorization is still pending.  Lucila MaineGrandson is on his way to pick her up The patient home to son's care with home health  DISCHARGE CONDITIONS:   fair  CONSULTS OBTAINED:  Treatment Team:  Lattie Hawooper, Richard E, MD   PROCEDURES  None  DRUG ALLERGIES:   Allergies  Allergen Reactions  . Atorvastatin Other (See Comments)    MUSCLE PAIN (all statins)  . Codeine Nausea Only  . Crestor [Rosuvastatin] Other (See Comments)    Leg pain/ joint pain  ( ALL THE STATIN's )  . Penicillins Rash    Has patient had a PCN reaction causing immediate rash, facial/tongue/throat swelling, SOB or  lightheadedness with hypotension: Yes Has patient had a PCN reaction causing severe rash involving mucus membranes or skin necrosis: No Has patient had a PCN reaction that required hospitalization No Has patient had a PCN reaction occurring within the last 10 years: No If all of the above answers are "NO", then may proceed with Cephalosporin use.   . Adhesive [Tape] Other (See Comments)  . Cephalexin Rash    DISCHARGE MEDICATIONS:   Allergies as of 03/26/2018      Reactions   Atorvastatin Other (See Comments)   MUSCLE PAIN (all statins)   Codeine Nausea Only   Crestor [rosuvastatin] Other (See Comments)   Leg pain/ joint pain  ( ALL THE STATIN's )   Penicillins Rash   Has patient had a PCN reaction causing immediate rash, facial/tongue/throat swelling, SOB or lightheadedness with hypotension: Yes Has patient had a PCN reaction causing severe rash involving mucus membranes or skin necrosis: No Has patient had a PCN reaction that required hospitalization No Has patient had a PCN reaction occurring within the last 10 years: No If all of the above answers are "NO", then may proceed with Cephalosporin use.   Adhesive [tape] Other (See Comments)   Cephalexin Rash      Medication List    TAKE these medications   acetaminophen 500 MG tablet Commonly known as:  TYLENOL Take 500-1,000 mg by mouth as needed for mild pain.   alum & mag hydroxide-simeth 200-200-20 MG/5ML suspension Commonly known as:  MAALOX/MYLANTA Take 30 mLs by mouth every 4 (four) hours as needed for indigestion or heartburn.   docusate sodium 100 MG capsule Commonly known as:  COLACE Take 1 capsule (100 mg total) by mouth 2 (two) times daily.   DULoxetine 20 MG capsule Commonly known as:  CYMBALTA Take 20 mg by mouth daily.   ferrous sulfate 325 (65 FE) MG tablet Take 325 mg by mouth daily with breakfast.   hydrocortisone 25 MG suppository Commonly known as:  ANUSOL-HC Place 1 suppository (25 mg total)  rectally 2 (two) times daily.   insulin NPH Human 100 UNIT/ML injection Commonly known as:  HUMULIN N,NOVOLIN N Inject 6 Units into the skin 2 (two) times daily before a meal.   levothyroxine 100 MCG tablet Commonly known as:  SYNTHROID, LEVOTHROID Take 100 mcg by mouth daily. DO NOT TAKE ON Monday AND WEDNESDAY   multivitamin tablet Take 1 tablet by mouth daily.   pantoprazole 40 MG tablet Commonly known as:  PROTONIX Take 40 mg by mouth daily.   polyethylene glycol packet Commonly known as:  MIRALAX / GLYCOLAX Take 17 g by mouth daily as needed for mild constipation. What changed:  when to take this   predniSONE 5 MG tablet Commonly known as:  DELTASONE Take 4 tablets (20 mg total) by mouth daily with breakfast. What changed:    how much to take  additional instructions   rosuvastatin 5 MG tablet Commonly known as:  CRESTOR Take 5 mg by mouth at bedtime.   Vitamin D (Ergocalciferol) 50000 units Caps capsule Commonly known as:  DRISDOL Take 50,000 Units by  mouth every 14 (fourteen) days.        DISCHARGE INSTRUCTIONS:   Follow-up with primary care physician in 3 days Follow-up with gastroenterology as needed Follow-up with Dr. Excell Seltzer in 1 week  DIET:  Diabetic diet, high-fiber diet  DISCHARGE CONDITION:  Fair  ACTIVITY:  Activity as tolerated with walker   OXYGEN:  Home Oxygen: No.   Oxygen Delivery: room air  DISCHARGE LOCATION:  home   If you experience worsening of your admission symptoms, develop shortness of breath, life threatening emergency, suicidal or homicidal thoughts you must seek medical attention immediately by calling 911 or calling your MD immediately  if symptoms less severe.  You Must read complete instructions/literature along with all the possible adverse reactions/side effects for all the Medicines you take and that have been prescribed to you. Take any new Medicines after you have completely understood and accpet all the  possible adverse reactions/side effects.   Please note  You were cared for by a hospitalist during your hospital stay. If you have any questions about your discharge medications or the care you received while you were in the hospital after you are discharged, you can call the unit and asked to speak with the hospitalist on call if the hospitalist that took care of you is not available. Once you are discharged, your primary care physician will handle any further medical issues. Please note that NO REFILLS for any discharge medications will be authorized once you are discharged, as it is imperative that you return to your primary care physician (or establish a relationship with a primary care physician if you do not have one) for your aftercare needs so that they can reassess your need for medications and monitor your lab values.     Today  Chief Complaint  Patient presents with  . Rectal Bleeding  . Anemia   Patient is resting comfortably.  Denies any abdominal pain.  Reports intermittent episodes of fresh blood when she is straining with constipation otherwise she is fine.  Okay to discharge patient from GI and surgery standpoint  ROS:  CONSTITUTIONAL: Denies fevers, chills. Denies any fatigue, weakness.  EYES: Denies blurry vision, double vision, eye pain. EARS, NOSE, THROAT: Denies tinnitus, ear pain, hearing loss. RESPIRATORY: Denies cough, wheeze, shortness of breath.  CARDIOVASCULAR: Denies chest pain, palpitations, edema.  GASTROINTESTINAL: Denies nausea, vomiting, diarrhea, abdominal pain. Denies bright red blood per rectum. GENITOURINARY: Denies dysuria, hematuria. ENDOCRINE: Denies nocturia or thyroid problems. HEMATOLOGIC AND LYMPHATIC: Denies easy bruising or bleeding. SKIN: Denies rash or lesion. MUSCULOSKELETAL: Denies pain in neck, back, shoulder, knees, hips or arthritic symptoms.  NEUROLOGIC: Denies paralysis, paresthesias.  PSYCHIATRIC: Denies anxiety or depressive  symptoms.   VITAL SIGNS:  Blood pressure (!) 144/52, pulse 75, temperature 98.2 F (36.8 C), temperature source Oral, resp. rate 18, height 5\' 5"  (1.651 m), weight 86.9 kg (191 lb 9.3 oz), SpO2 99 %.  I/O:    Intake/Output Summary (Last 24 hours) at 03/26/2018 1419 Last data filed at 03/26/2018 1357 Gross per 24 hour  Intake 600 ml  Output 1800 ml  Net -1200 ml    PHYSICAL EXAMINATION:  GENERAL:  83 y.o.-year-old patient lying in the bed with no acute distress.  EYES: Pupils equal, round, reactive to light and accommodation. No scleral icterus. Extraocular muscles intact.  HEENT: Head atraumatic, normocephalic. Oropharynx and nasopharynx clear.  NECK:  Supple, no jugular venous distention. No thyroid enlargement, no tenderness.  LUNGS: Normal breath sounds bilaterally, no wheezing, rales,rhonchi or  crepitation. No use of accessory muscles of respiration.  CARDIOVASCULAR: S1, S2 normal. No murmurs, rubs, or gallops.  ABDOMEN: Soft, non-tender, non-distended. Bowel sounds present. No organomegaly or mass.  EXTREMITIES: No pedal edema, cyanosis, or clubbing.  NEUROLOGIC: Cranial nerves II through XII are intact. Muscle strength 5/5 in all extremities. Sensation intact. Gait not checked.  PSYCHIATRIC: The patient is alert and oriented x 3.  SKIN: No obvious rash, lesion, or ulcer.   DATA REVIEW:   CBC Recent Labs  Lab 03/26/18 0309  WBC 4.9  HGB 8.2*  HCT 23.9*  PLT 194    Chemistries  Recent Labs  Lab 03/22/18 1549 03/23/18 0319  NA 141 143  K 3.6 3.7  CL 109 113*  CO2 22 24  GLUCOSE 196* 118*  BUN 14 13  CREATININE 0.97 0.72  CALCIUM 8.1* 8.0*  AST 35  --   ALT 12  --   ALKPHOS 35*  --   BILITOT 0.7  --     Cardiac Enzymes Recent Labs  Lab 03/22/18 1549  TROPONINI <0.03    Microbiology Results  Results for orders placed or performed during the hospital encounter of 02/25/16  Surgical pcr screen     Status: None   Collection Time: 02/25/16  3:03 PM   Result Value Ref Range Status   MRSA, PCR NEGATIVE NEGATIVE Final   Staphylococcus aureus NEGATIVE NEGATIVE Final    Comment:        The Xpert SA Assay (FDA approved for NASAL specimens in patients over 26 years of age), is one component of a comprehensive surveillance program.  Test performance has been validated by [facility] for patients greater than or equal to 60 year old. It is not intended to diagnose infection nor to guide or monitor treatment.     RADIOLOGY:  No results found.  EKG:   Orders placed or performed during the hospital encounter of 06/23/17  . ED EKG  . ED EKG  . EKG      Management plans discussed with the patient, family and they are in agreement.  CODE STATUS:     Code Status Orders  (From admission, onward)        Start     Ordered   03/22/18 2304  Full code  Continuous     03/22/18 2303    Code Status History    Date Active Date Inactive Code Status Order ID Comments User Context   06/23/2017 1757 06/25/2017 1806 Full Code [id number]  Casey Burkitt, MD Inpatient   09/26/2016 1649 09/29/2016 1822 Full Code [id number]  Tillman Sers, DO Inpatient   02/29/2016 1244 03/01/2016 1324 Full Code [id number]  Danelle Berry Inpatient   02/03/2016 2236 02/04/2016 1406 Full Code [id number]  Pryor Ochoa, MD Inpatient    Advance Directive Documentation     Most Recent Value  Type of Advance Directive  Healthcare Power of Attorney, Living will  Pre-existing out of facility DNR order (yellow form or pink MOST form)  -  "MOST" Form in Place?  -      TOTAL TIME TAKING CARE OF THIS PATIENT: 43 minutes.   Note: This dictation was prepared with Dragon dictation along with smaller phrase technology. Any transcriptional errors that result from this process are unintentional.   @MEC @  on 03/26/2018 at 2:19 PM  Between 7am to 6pm - Pager - [phone]  After 6pm go to [email or web address] - password [email or web address] [facility]  Office  [phone]  CC: Primary care physician; [name], MD

## 2018-04-03 ENCOUNTER — Ambulatory Visit: Payer: Self-pay | Admitting: Surgery

## 2018-06-27 ENCOUNTER — Encounter: Payer: Self-pay | Admitting: *Deleted

## 2020-11-02 ENCOUNTER — Encounter: Payer: Medicare HMO | Admitting: Vascular Surgery

## 2020-11-09 ENCOUNTER — Other Ambulatory Visit: Payer: Self-pay

## 2020-11-09 ENCOUNTER — Ambulatory Visit: Payer: Medicare HMO | Admitting: Vascular Surgery

## 2020-11-09 ENCOUNTER — Encounter: Payer: Self-pay | Admitting: Vascular Surgery

## 2020-11-09 VITALS — BP 107/62 | HR 68 | Temp 97.9°F | Resp 12 | Ht 65.0 in | Wt 140.0 lb

## 2020-11-09 DIAGNOSIS — Z9889 Other specified postprocedural states: Secondary | ICD-10-CM

## 2020-11-09 DIAGNOSIS — Z8679 Personal history of other diseases of the circulatory system: Secondary | ICD-10-CM | POA: Diagnosis not present

## 2020-11-09 NOTE — Progress Notes (Signed)
Vascular and Vein Specialist of   Patient name: RONDELL FRICK MRN: [id number] DOB: [birth year] Sex: female  REASON FOR VISIT: Follow-up stent graft repair abdominal aortic aneurysm  HPI: SHANETTE TAMARGO is a 86 y.o. female here today for follow-up.  She is here with her son.  She is quite frail.  She is in a wheelchair.  She is extremely hard of hearing but is alert and oriented.  She has a very complicated past history.  She underwent stent graft repair abdominal arctic aneurysm in 2008.  Initially she had stable sac size but then was found to have some enlargement.  Arteriogram in 2017 suggested possible type I a endoleak and she was treated with an aortic cuff in July 2017.  CT scan in July 2019 revealed maximal diameter of 6.4 cm.  She subsequently developed difficulty with posterior circulation ischemia and chronic dizziness.  On my last visit with her she was found to have severe subclavian and innominate disease.  She was treated at [facility] with innominate stenting with relief of her symptoms.  She had recurrent problems and underwent restenting of a restenosis of her innominate stent in January 2022.  She was discharged to a nursing facility.  During that admission she underwent CT scan which showed maximal diameter of her aortic sac of 7.8 cm.  I have her report but do not have the actual films.  She reportedly has a type II endoleak from lumbar branches.  She has no symptoms referable to this.  Past Medical History:  Diagnosis Date  . AAA (abdominal aortic aneurysm) (HCC)   . Anemia   . Arthritis    Neck  . Bladder prolapse, female, acquired   . Carotid artery occlusion   . Endoleak post endovascular aneurysm repair [facility]), Type I 02/2016   [facility] for aortic cuff placement  . Hearing aid worn   . Hypothyroidism   . Myocardial infarction (HCC) 1982  . Polymyalgia rheumatica (HCC)   . Prolapsed internal  hemorrhoids, grade 3   . Shingles   . Uterine prolapse     Family History  Problem Relation Age of Onset  . Heart disease Father        After age 78   . Heart attack Father   . Diabetes Sister   . Ovarian cancer Sister        may be uterine...  . Cancer Brother   . Heart disease Brother   . Diabetes Brother   . Prostate cancer Brother   . Stroke Mother   . Stroke Sister   . Heart disease Brother   . Arthritis Brother   . Prostate cancer Brother   . Heart attack Sister   . Diabetes Paternal Grandfather     SOCIAL HISTORY: Social History   Tobacco Use  . Smoking status: Former Smoker    Quit date: 08/22/1980    Years since quitting: 40.2  . Smokeless tobacco: Never Used  Substance Use Topics  . Alcohol use: No    Allergies  Allergen Reactions  . Atorvastatin Other (See Comments)    MUSCLE PAIN (all statins)  . Codeine Nausea Only  . Crestor [Rosuvastatin] Other (See Comments)    Leg pain/ joint pain  ( ALL THE STATIN's )  . Penicillins Rash    Has patient had a PCN reaction causing immediate rash, facial/tongue/throat swelling, SOB or lightheadedness with hypotension: Yes Has patient had a PCN reaction causing severe rash  involving mucus membranes or skin necrosis: No Has patient had a PCN reaction that required hospitalization No Has patient had a PCN reaction occurring within the last 10 years: No If all of the above answers are "NO", then may proceed with Cephalosporin use.   . Adhesive [Tape] Other (See Comments)  . Cephalexin Rash    Current Outpatient Medications  Medication Sig Dispense Refill  . Cholecalciferol 25 MCG (1000 UT) tablet Take by mouth.    . levothyroxine (SYNTHROID) 75 MCG tablet Take 1 tablet by mouth daily.    . Multiple Vitamin (MULTIVITAMIN) tablet Take 1 tablet by mouth daily.    . polyethylene glycol (MIRALAX / GLYCOLAX) packet Take 17 g by mouth daily as needed for mild constipation. (Patient taking differently: Take 17 g by mouth  2 (two) times daily.) 14 each 0  . predniSONE (DELTASONE) 5 MG tablet Take 4 tablets (20 mg total) by mouth daily with breakfast. (Patient taking differently: Take 30 mg by mouth daily with breakfast.) 56 tablet 0  . rosuvastatin (CRESTOR) 5 MG tablet Take 5 mg by mouth at bedtime.    Marland Kitchen acetaminophen (TYLENOL) 500 MG tablet Take 500-1,000 mg by mouth as needed for mild pain.     . cyanocobalamin 1000 MCG tablet Take by mouth.    . docusate sodium (COLACE) 100 MG capsule Take 1 capsule (100 mg total) by mouth 2 (two) times daily. 10 capsule 0   No current facility-administered medications for this visit.    REVIEW OF SYSTEMS:  [X]  denotes positive finding, [ ]  denotes negative finding Cardiac  Comments:  Chest pain or chest pressure:    Shortness of breath upon exertion:    Short of breath when lying flat:    Irregular heart rhythm:        Vascular    Pain in calf, thigh, or hip brought on by ambulation:    Pain in feet at night that wakes you up from your sleep:     Blood clot in your veins:    Leg swelling:           PHYSICAL EXAM: Vitals:   11/09/20 1339  BP: 107/62  Pulse: 68  Resp: 12  Temp: 97.9 F (36.6 C)  TempSrc: Other (Comment)  SpO2: 93%  Weight: 140 lb (63.5 kg)  Height: 5\' 5"  (1.651 m)    GENERAL: The patient is a well-nourished female, in no acute distress. The vital signs are documented above. CARDIOVASCULAR: 2+ radial pulses bilaterally.  2+ popliteal pulses bilaterally.  Bilateral pitting edema.  Abdomen soft I do not palpate an aneurysm PULMONARY: There is good air exchange  MUSCULOSKELETAL: There are no major deformities or cyanosis. NEUROLOGIC: No focal weakness or paresthesias are detected. SKIN: There are no ulcers or rashes noted. PSYCHIATRIC: The patient has a normal affect.  DATA:  CT report suggest increase in size to 7.8 cm from her 6.4 cm in July 2019.  She has a associated type II endoleak.  MEDICAL ISSUES: I had a very long discussion  with the patient and her son.  She has no symptoms referable to her sac enlargement.  I explained the options would be continued observation versus attempt to treat with interventional radiology with either transsciatic glue injection or coiling of tributaries.  I explained that in my experience this is frequently not successful in stopping the sac expansion.  Also explained that with no evidence of type I endoleak that she is probably protected from rupture although this  cannot be totally assured.  The patient and her son wish continued observation for now and we will repeat her CT scan in 1 year with office visit.  She clearly is not a candidate for explant of her stent graft and aorto open replacement.    Larina Earthly, MD FACS [facility] [phone]  Note: Portions of this report may have been transcribed using voice recognition software.  Every effort has been made to ensure accuracy; however, inadvertent computerized transcription errors may still be present.

## 2021-08-15 ENCOUNTER — Emergency Department: Payer: Medicare HMO

## 2021-08-15 ENCOUNTER — Inpatient Hospital Stay
Admission: EM | Admit: 2021-08-15 | Discharge: 2021-08-19 | DRG: 987 | Disposition: A | Discharge status: Skilled Nursing Facility | Payer: Medicare HMO | Source: Skilled Nursing Facility | Attending: Internal Medicine | Admitting: Internal Medicine

## 2021-08-15 ENCOUNTER — Inpatient Hospital Stay: Payer: Medicare HMO

## 2021-08-15 ENCOUNTER — Other Ambulatory Visit: Payer: Self-pay

## 2021-08-15 DIAGNOSIS — G4700 Insomnia, unspecified: Secondary | ICD-10-CM | POA: Diagnosis present

## 2021-08-15 DIAGNOSIS — K551 Chronic vascular disorders of intestine: Secondary | ICD-10-CM | POA: Diagnosis present

## 2021-08-15 DIAGNOSIS — L8990 Pressure ulcer of unspecified site, unspecified stage: Secondary | ICD-10-CM | POA: Insufficient documentation

## 2021-08-15 DIAGNOSIS — I9789 Other postprocedural complications and disorders of the circulatory system, not elsewhere classified: Secondary | ICD-10-CM | POA: Diagnosis present

## 2021-08-15 DIAGNOSIS — E785 Hyperlipidemia, unspecified: Secondary | ICD-10-CM | POA: Diagnosis present

## 2021-08-15 DIAGNOSIS — I7143 Infrarenal abdominal aortic aneurysm, without rupture: Secondary | ICD-10-CM | POA: Diagnosis present

## 2021-08-15 DIAGNOSIS — U071 COVID-19: Secondary | ICD-10-CM | POA: Diagnosis present

## 2021-08-15 DIAGNOSIS — H9190 Unspecified hearing loss, unspecified ear: Secondary | ICD-10-CM | POA: Diagnosis present

## 2021-08-15 DIAGNOSIS — Z8261 Family history of arthritis: Secondary | ICD-10-CM

## 2021-08-15 DIAGNOSIS — Z8041 Family history of malignant neoplasm of ovary: Secondary | ICD-10-CM

## 2021-08-15 DIAGNOSIS — Y838 Other surgical procedures as the cause of abnormal reaction of the patient, or of later complication, without mention of misadventure at the time of the procedure: Secondary | ICD-10-CM | POA: Diagnosis present

## 2021-08-15 DIAGNOSIS — Z885 Allergy status to narcotic agent status: Secondary | ICD-10-CM

## 2021-08-15 DIAGNOSIS — Z66 Do not resuscitate: Secondary | ICD-10-CM | POA: Diagnosis present

## 2021-08-15 DIAGNOSIS — Z91048 Other nonmedicinal substance allergy status: Secondary | ICD-10-CM

## 2021-08-15 DIAGNOSIS — D509 Iron deficiency anemia, unspecified: Secondary | ICD-10-CM | POA: Diagnosis present

## 2021-08-15 DIAGNOSIS — R54 Age-related physical debility: Secondary | ICD-10-CM | POA: Diagnosis not present

## 2021-08-15 DIAGNOSIS — Z6820 Body mass index (BMI) 20.0-20.9, adult: Secondary | ICD-10-CM

## 2021-08-15 DIAGNOSIS — Z79890 Hormone replacement therapy (postmenopausal): Secondary | ICD-10-CM

## 2021-08-15 DIAGNOSIS — F03918 Unspecified dementia, unspecified severity, with other behavioral disturbance: Secondary | ICD-10-CM | POA: Diagnosis present

## 2021-08-15 DIAGNOSIS — I6523 Occlusion and stenosis of bilateral carotid arteries: Secondary | ICD-10-CM

## 2021-08-15 DIAGNOSIS — Z8042 Family history of malignant neoplasm of prostate: Secondary | ICD-10-CM

## 2021-08-15 DIAGNOSIS — Z01810 Encounter for preprocedural cardiovascular examination: Secondary | ICD-10-CM

## 2021-08-15 DIAGNOSIS — Z880 Allergy status to penicillin: Secondary | ICD-10-CM

## 2021-08-15 DIAGNOSIS — Z8249 Family history of ischemic heart disease and other diseases of the circulatory system: Secondary | ICD-10-CM

## 2021-08-15 DIAGNOSIS — Z823 Family history of stroke: Secondary | ICD-10-CM

## 2021-08-15 DIAGNOSIS — K648 Other hemorrhoids: Secondary | ICD-10-CM | POA: Diagnosis present

## 2021-08-15 DIAGNOSIS — Z881 Allergy status to other antibiotic agents status: Secondary | ICD-10-CM

## 2021-08-15 DIAGNOSIS — D638 Anemia in other chronic diseases classified elsewhere: Secondary | ICD-10-CM | POA: Diagnosis present

## 2021-08-15 DIAGNOSIS — Z95828 Presence of other vascular implants and grafts: Secondary | ICD-10-CM

## 2021-08-15 DIAGNOSIS — S22089S Unspecified fracture of T11-T12 vertebra, sequela: Secondary | ICD-10-CM

## 2021-08-15 DIAGNOSIS — I2699 Other pulmonary embolism without acute cor pulmonale: Secondary | ICD-10-CM | POA: Diagnosis present

## 2021-08-15 DIAGNOSIS — Z9181 History of falling: Secondary | ICD-10-CM

## 2021-08-15 DIAGNOSIS — I252 Old myocardial infarction: Secondary | ICD-10-CM

## 2021-08-15 DIAGNOSIS — I248 Other forms of acute ischemic heart disease: Secondary | ICD-10-CM | POA: Diagnosis present

## 2021-08-15 DIAGNOSIS — K2970 Gastritis, unspecified, without bleeding: Secondary | ICD-10-CM | POA: Diagnosis present

## 2021-08-15 DIAGNOSIS — A0839 Other viral enteritis: Secondary | ICD-10-CM | POA: Diagnosis present

## 2021-08-15 DIAGNOSIS — E039 Hypothyroidism, unspecified: Secondary | ICD-10-CM | POA: Diagnosis present

## 2021-08-15 DIAGNOSIS — I2510 Atherosclerotic heart disease of native coronary artery without angina pectoris: Secondary | ICD-10-CM | POA: Diagnosis present

## 2021-08-15 DIAGNOSIS — U099 Post covid-19 condition, unspecified: Secondary | ICD-10-CM | POA: Diagnosis present

## 2021-08-15 DIAGNOSIS — Z888 Allergy status to other drugs, medicaments and biological substances status: Secondary | ICD-10-CM

## 2021-08-15 DIAGNOSIS — R112 Nausea with vomiting, unspecified: Secondary | ICD-10-CM | POA: Diagnosis not present

## 2021-08-15 DIAGNOSIS — Z833 Family history of diabetes mellitus: Secondary | ICD-10-CM

## 2021-08-15 DIAGNOSIS — I739 Peripheral vascular disease, unspecified: Secondary | ICD-10-CM | POA: Diagnosis present

## 2021-08-15 DIAGNOSIS — M4854XA Collapsed vertebra, not elsewhere classified, thoracic region, initial encounter for fracture: Secondary | ICD-10-CM | POA: Diagnosis present

## 2021-08-15 DIAGNOSIS — Z87891 Personal history of nicotine dependence: Secondary | ICD-10-CM

## 2021-08-15 DIAGNOSIS — I774 Celiac artery compression syndrome: Secondary | ICD-10-CM | POA: Diagnosis present

## 2021-08-15 DIAGNOSIS — Z789 Other specified health status: Secondary | ICD-10-CM | POA: Diagnosis not present

## 2021-08-15 DIAGNOSIS — Z79899 Other long term (current) drug therapy: Secondary | ICD-10-CM

## 2021-08-15 DIAGNOSIS — M353 Polymyalgia rheumatica: Secondary | ICD-10-CM | POA: Diagnosis present

## 2021-08-15 DIAGNOSIS — K920 Hematemesis: Secondary | ICD-10-CM | POA: Diagnosis not present

## 2021-08-15 DIAGNOSIS — Z7952 Long term (current) use of systemic steroids: Secondary | ICD-10-CM

## 2021-08-15 DIAGNOSIS — I714 Abdominal aortic aneurysm, without rupture, unspecified: Secondary | ICD-10-CM | POA: Diagnosis not present

## 2021-08-15 DIAGNOSIS — Z515 Encounter for palliative care: Secondary | ICD-10-CM | POA: Diagnosis not present

## 2021-08-15 LAB — CBC WITH DIFFERENTIAL/PLATELET
Abs Immature Granulocytes: 0.03 10*3/uL (ref 0.00–0.07)
Basophils Absolute: 0.1 10*3/uL (ref 0.0–0.1)
Basophils Relative: 1 %
Eosinophils Absolute: 0 10*3/uL (ref 0.0–0.5)
Eosinophils Relative: 0 %
HCT: 32 % — ABNORMAL LOW (ref 36.0–46.0)
Hemoglobin: 10.9 g/dL — ABNORMAL LOW (ref 12.0–15.0)
Immature Granulocytes: 1 %
Lymphocytes Relative: 19 %
Lymphs Abs: 1 10*3/uL (ref 0.7–4.0)
MCH: 34.9 pg — ABNORMAL HIGH (ref 26.0–34.0)
MCHC: 34.1 g/dL (ref 30.0–36.0)
MCV: 102.6 fL — ABNORMAL HIGH (ref 80.0–100.0)
Monocytes Absolute: 0.3 10*3/uL (ref 0.1–1.0)
Monocytes Relative: 5 %
Neutro Abs: 3.8 10*3/uL (ref 1.7–7.7)
Neutrophils Relative %: 74 %
Platelets: 397 10*3/uL (ref 150–400)
RBC: 3.12 MIL/uL — ABNORMAL LOW (ref 3.87–5.11)
RDW: 18.9 % — ABNORMAL HIGH (ref 11.5–15.5)
WBC: 5.2 10*3/uL (ref 4.0–10.5)
nRBC: 0.4 % — ABNORMAL HIGH (ref 0.0–0.2)

## 2021-08-15 LAB — RESP PANEL BY RT-PCR (FLU A&B, COVID) ARPGX2
Influenza A by PCR: NEGATIVE
Influenza B by PCR: NEGATIVE
SARS Coronavirus 2 by RT PCR: POSITIVE — AB

## 2021-08-15 LAB — TROPONIN I (HIGH SENSITIVITY)
Troponin I (High Sensitivity): 25 ng/L — ABNORMAL HIGH (ref <18)
Troponin I (High Sensitivity): 24 ng/L — ABNORMAL HIGH (ref <18)

## 2021-08-15 LAB — FIBRINOGEN: Fibrinogen: 210 mg/dL (ref 210–475)

## 2021-08-15 LAB — IRON AND TIBC
Iron: 67 ug/dL (ref 28–170)
Saturation Ratios: 50 % — ABNORMAL HIGH (ref 10.4–31.8)
TIBC: 133 ug/dL — ABNORMAL LOW (ref 250–450)
UIBC: 66 ug/dL

## 2021-08-15 LAB — COMPREHENSIVE METABOLIC PANEL
ALT: 14 U/L (ref 0–44)
AST: 43 U/L — ABNORMAL HIGH (ref 15–41)
Albumin: 2.7 g/dL — ABNORMAL LOW (ref 3.5–5.0)
Alkaline Phosphatase: 97 U/L (ref 38–126)
Anion gap: 8 (ref 5–15)
BUN: 12 mg/dL (ref 8–23)
CO2: 24 mmol/L (ref 22–32)
Calcium: 8.2 mg/dL — ABNORMAL LOW (ref 8.9–10.3)
Chloride: 104 mmol/L (ref 98–111)
Creatinine, Ser: 0.63 mg/dL (ref 0.44–1.00)
GFR, Estimated: >60 mL/min (ref >60)
Glucose, Bld: 142 mg/dL — ABNORMAL HIGH (ref 70–99)
Potassium: 3.6 mmol/L (ref 3.5–5.1)
Sodium: 136 mmol/L (ref 135–145)
Total Bilirubin: 1.3 mg/dL — ABNORMAL HIGH (ref 0.3–1.2)
Total Protein: 6.3 g/dL — ABNORMAL LOW (ref 6.5–8.1)

## 2021-08-15 LAB — LACTATE DEHYDROGENASE: LDH: 245 U/L — ABNORMAL HIGH (ref 98–192)

## 2021-08-15 LAB — HEMOGLOBIN AND HEMATOCRIT, BLOOD
HCT: 28 % — ABNORMAL LOW (ref 36.0–46.0)
HCT: 30.3 % — ABNORMAL LOW (ref 36.0–46.0)
Hemoglobin: 9.6 g/dL — ABNORMAL LOW (ref 12.0–15.0)
Hemoglobin: 10.5 g/dL — ABNORMAL LOW (ref 12.0–15.0)

## 2021-08-15 LAB — TRANSFERRIN: Transferrin: 108 mg/dL — ABNORMAL LOW (ref 192–382)

## 2021-08-15 LAB — LACTIC ACID, PLASMA
Lactic Acid, Venous: 1.7 mmol/L (ref 0.5–1.9)
Lactic Acid, Venous: 1.7 mmol/L (ref 0.5–1.9)

## 2021-08-15 LAB — C-REACTIVE PROTEIN: CRP: 0.9 mg/dL (ref <1.0)

## 2021-08-15 LAB — PROTIME-INR
INR: 1.3 — ABNORMAL HIGH (ref 0.8–1.2)
Prothrombin Time: 16.6 s — ABNORMAL HIGH (ref 11.4–15.2)

## 2021-08-15 LAB — APTT: aPTT: 33 seconds (ref 24–36)

## 2021-08-15 LAB — LIPASE, BLOOD: Lipase: 30 U/L (ref 11–51)

## 2021-08-15 LAB — FERRITIN: Ferritin: 840 ng/mL — ABNORMAL HIGH (ref 11–307)

## 2021-08-15 LAB — D-DIMER, QUANTITATIVE: D-Dimer, Quant: 3.41 ug/mL-FEU — ABNORMAL HIGH (ref 0.00–0.50)

## 2021-08-15 LAB — BRAIN NATRIURETIC PEPTIDE: B Natriuretic Peptide: 808.4 pg/mL — ABNORMAL HIGH (ref 0.0–100.0)

## 2021-08-15 LAB — HEPATITIS B SURFACE ANTIGEN: Hepatitis B Surface Ag: NONREACTIVE

## 2021-08-15 LAB — PROCALCITONIN: Procalcitonin: <0.1 ng/mL

## 2021-08-15 MED ORDER — ESTROGENS CONJUGATED 0.625 MG/GM VA CREA
0.5000 g | TOPICAL_CREAM | VAGINAL | Status: DC
  Administered 2021-08-16 – 2021-08-19 (×2): 0.25 via VAGINAL
  Filled 2021-08-15: qty 30

## 2021-08-15 MED ORDER — HYDROCODONE-ACETAMINOPHEN 5-325 MG PO TABS
1.0000 | ORAL_TABLET | Freq: Four times a day (QID) | ORAL | Status: DC | PRN
  Administered 2021-08-15: 12:00:00 1 via ORAL
  Filled 2021-08-15: qty 1

## 2021-08-15 MED ORDER — METHYLPREDNISOLONE SODIUM SUCC 125 MG IJ SOLR
1.0000 mg/kg | Freq: Two times a day (BID) | INTRAMUSCULAR | Status: DC
  Administered 2021-08-15 – 2021-08-17 (×5): 61.25 mg via INTRAVENOUS
  Filled 2021-08-15 (×5): qty 2

## 2021-08-15 MED ORDER — POLYETHYLENE GLYCOL 3350 17 G PO PACK: 17.0000 g | PACK | Freq: Every day | ORAL | Status: DC | PRN

## 2021-08-15 MED ORDER — ASPIRIN 81 MG PO CHEW
81.0000 mg | CHEWABLE_TABLET | ORAL | Status: DC
  Administered 2021-08-15: 12:00:00 81 mg via ORAL
  Filled 2021-08-15: qty 1

## 2021-08-15 MED ORDER — LACTATED RINGERS IV SOLN: INTRAVENOUS | Status: DC

## 2021-08-15 MED ORDER — SODIUM CHLORIDE 0.9 % IV BOLUS
1000.0000 mL | Freq: Once | INTRAVENOUS | Status: AC
  Administered 2021-08-15: 05:00:00 1000 mL via INTRAVENOUS

## 2021-08-15 MED ORDER — MIRTAZAPINE 15 MG PO TABS
15.0000 mg | ORAL_TABLET | Freq: Every day | ORAL | Status: DC
  Administered 2021-08-15 – 2021-08-18 (×4): 15 mg via ORAL
  Filled 2021-08-15 (×4): qty 1

## 2021-08-15 MED ORDER — PANTOPRAZOLE SODIUM 40 MG IV SOLR
40.0000 mg | Freq: Two times a day (BID) | INTRAVENOUS | Status: DC
Start: 2021-08-15 — End: 2021-08-20
  Administered 2021-08-15 – 2021-08-19 (×9): 40 mg via INTRAVENOUS
  Filled 2021-08-15 (×9): qty 40

## 2021-08-15 MED ORDER — PREDNISONE 20 MG PO TABS
50.0000 mg | ORAL_TABLET | Freq: Every day | ORAL | Status: DC
  Administered 2021-08-18: 10:00:00 50 mg via ORAL
  Filled 2021-08-15: qty 3

## 2021-08-15 MED ORDER — IOHEXOL 350 MG/ML SOLN
100.0000 mL | Freq: Once | INTRAVENOUS | Status: AC | PRN
  Administered 2021-08-15: 14:00:00 100 mL via INTRAVENOUS

## 2021-08-15 MED ORDER — ONDANSETRON HCL 4 MG/2ML IJ SOLN
4.0000 mg | Freq: Once | INTRAMUSCULAR | Status: AC
  Administered 2021-08-15: 06:00:00 4 mg via INTRAVENOUS
  Filled 2021-08-15: qty 2

## 2021-08-15 MED ORDER — SODIUM CHLORIDE 0.9% FLUSH
10.0000 mL | Freq: Two times a day (BID) | INTRAVENOUS | Status: DC
Start: 2021-08-15 — End: 2021-08-20
  Administered 2021-08-15 – 2021-08-18 (×7): 10 mL

## 2021-08-15 MED ORDER — FERROUS SULFATE 325 (65 FE) MG PO TABS
324.0000 mg | ORAL_TABLET | ORAL | Status: DC
  Administered 2021-08-15 – 2021-08-19 (×3): 324 mg via ORAL
  Filled 2021-08-15 (×4): qty 1

## 2021-08-15 MED ORDER — ATORVASTATIN CALCIUM 10 MG PO TABS
10.0000 mg | ORAL_TABLET | Freq: Every day | ORAL | Status: DC
  Administered 2021-08-15 – 2021-08-18 (×4): 10 mg via ORAL
  Filled 2021-08-15 (×4): qty 1

## 2021-08-15 MED ORDER — ACETAMINOPHEN 325 MG PO TABS: 650.0000 mg | ORAL_TABLET | Freq: Four times a day (QID) | ORAL | Status: DC | PRN

## 2021-08-15 MED ORDER — MELATONIN 5 MG PO TABS
3.0000 mg | ORAL_TABLET | Freq: Every evening | ORAL | Status: DC | PRN
  Filled 2021-08-15: qty 1

## 2021-08-15 MED ORDER — SODIUM CHLORIDE 0.9 % IV SOLN
6.2500 mg | Freq: Four times a day (QID) | INTRAVENOUS | Status: DC | PRN
  Administered 2021-08-15: 12:00:00 6.25 mg via INTRAVENOUS
  Filled 2021-08-15 (×3): qty 0.25

## 2021-08-15 MED ORDER — LEVOFLOXACIN 750 MG PO TABS
750.0000 mg | ORAL_TABLET | ORAL | Status: DC
  Administered 2021-08-15: 18:00:00 750 mg via ORAL
  Filled 2021-08-15 (×2): qty 1

## 2021-08-15 MED ORDER — ONDANSETRON HCL 4 MG/2ML IJ SOLN
4.0000 mg | Freq: Four times a day (QID) | INTRAMUSCULAR | Status: DC | PRN
  Administered 2021-08-15: 18:00:00 4 mg via INTRAVENOUS
  Filled 2021-08-15: qty 2

## 2021-08-15 MED ORDER — VITAMIN B-12 1000 MCG PO TABS
1000.0000 ug | ORAL_TABLET | Freq: Every day | ORAL | Status: DC
  Administered 2021-08-15 – 2021-08-17 (×3): 1000 ug via ORAL
  Filled 2021-08-15 (×3): qty 1

## 2021-08-15 MED ORDER — LEVOFLOXACIN 750 MG PO TABS: 750.0000 mg | ORAL_TABLET | Freq: Every day | ORAL | Status: DC

## 2021-08-15 MED ORDER — LEVOTHYROXINE SODIUM 50 MCG PO TABS
75.0000 ug | ORAL_TABLET | Freq: Every day | ORAL | Status: DC
  Administered 2021-08-16 – 2021-08-19 (×3): 75 ug via ORAL
  Filled 2021-08-15 (×3): qty 2

## 2021-08-15 MED ORDER — ADULT MULTIVITAMIN W/MINERALS CH
1.0000 | ORAL_TABLET | Freq: Every day | ORAL | Status: DC
  Administered 2021-08-15 – 2021-08-19 (×4): 1 via ORAL
  Filled 2021-08-15 (×4): qty 1

## 2021-08-15 MED ORDER — PANTOPRAZOLE SODIUM 40 MG IV SOLR: 40.0000 mg | INTRAVENOUS | Status: DC

## 2021-08-15 MED ORDER — SODIUM CHLORIDE 0.9 % IV SOLN
100.0000 mg | Freq: Every day | INTRAVENOUS | Status: DC
  Administered 2021-08-16 – 2021-08-18 (×3): 100 mg via INTRAVENOUS
  Filled 2021-08-15 (×3): qty 100

## 2021-08-15 MED ORDER — HEPARIN BOLUS VIA INFUSION
4000.0000 [IU] | Freq: Once | INTRAVENOUS | Status: AC
Start: 2021-08-15 — End: 2021-08-15
  Administered 2021-08-15: 18:00:00 4000 [IU] via INTRAVENOUS
  Filled 2021-08-15: qty 4000

## 2021-08-15 MED ORDER — SODIUM CHLORIDE 0.9 % IV SOLN
200.0000 mg | Freq: Once | INTRAVENOUS | Status: AC
  Administered 2021-08-15: 08:00:00 200 mg via INTRAVENOUS
  Filled 2021-08-15: qty 200

## 2021-08-15 MED ORDER — SODIUM CHLORIDE 0.9% FLUSH: 10.0000 mL | INTRAVENOUS | Status: DC | PRN

## 2021-08-15 MED ORDER — HEPARIN (PORCINE) 25000 UT/250ML-% IV SOLN
800.0000 [IU]/h | INTRAVENOUS | Status: DC
  Administered 2021-08-15: 18:00:00 1000 [IU]/h via INTRAVENOUS
  Filled 2021-08-15: qty 250

## 2021-08-15 MED ORDER — VITAMIN D 25 MCG (1000 UNIT) PO TABS
1000.0000 [IU] | ORAL_TABLET | Freq: Every day | ORAL | Status: DC
  Administered 2021-08-15 – 2021-08-19 (×4): 1000 [IU] via ORAL
  Filled 2021-08-15 (×4): qty 1

## 2021-08-15 NOTE — Consult Note (Signed)
Cardiology Consultation:   Patient ID: Margaret Castillo MRN: [id number]; DOB: 13-Jun-1935  Admit date: 08/15/2021 Date of Consult: 08/15/2021  PCP:  [name], MD   [facility] Providers Cardiologist:  None        Patient Profile:   Margaret Castillo is a 86 y.o. female with a hx of PAD (see below)  who is being seen 08/15/2021 for the evaluation of preoperated cardiovascular evaluation at the request of Dr. Linna Darner.  History of Present Illness:   Ms. Firestine presented with nausea/emesis, found to have Covid. Incidentally a T12 compression fracture was found, and her infrarenal abdominal aortic aneurysm was found to have enlarged to 8.3 cm. Cardiology asked to evaluate for potential surgery/risk assessment.  At this time, the vascular surgery team is gathering additional information about the AAA and possible leak. No definitive plans for intervention noted at this time.  She has been seen in the past by [facility] cardiology. Per notes, had a history of MI in 1982. I cannot find any information regarding prior cardiac cath. Her most recent visit appears to have been 01/25/2018, where the main issue was hypotension and likely autonomic dysfunction.  Her daughter in law is at bedside. Patient is very hard of hearing, asks that her daughter in law be her historian.  Daughter in law lost her husband/patient's son earlier this year. He had been the patient's primary caregiver, but with patient's declining health she required a higher level of care. Daughter in law notes that she has had no appetite, and for the last few weeks to months, she has been noticing abdominal pain like she needs to have a bowel movement, but nothing happens. She had Covid several weeks ago at her facility. She came in with nausea/emesis, which is still present but improved.  We had an extended discussion about goals of care, see below.  She confirmed that she has not had any issues with her heart. No  chest pain. Breathing has been stable. Has been dealing with syncope and orthostasis, part of the reason she needed a higher level of care.  Past Medical History:  Diagnosis Date   AAA (abdominal aortic aneurysm)    Anemia    Arthritis    Neck   Bladder prolapse, female, acquired    Carotid artery occlusion    Endoleak post endovascular aneurysm repair [facility]), Type I 02/2016   [facility] for aortic cuff placement   Hearing aid worn    Hypothyroidism    Myocardial infarction [facility]) 1982   Polymyalgia rheumatica (HCC)    Prolapsed internal hemorrhoids, grade 3    Shingles    Uterine prolapse     Past Surgical History:  Procedure Laterality Date   ABDOMINAL AORTIC ANEURYSM REPAIR  2008   Infrarenal aneurysm stent graft repair   ABDOMINAL AORTIC ENDOVASCULAR STENT GRAFT N/A 02/29/2016   Procedure: Aortic cuff/Gore;  Surgeon: Larina Earthly, MD;  Location: [facility] OR;  Service: Vascular;  Laterality: N/A;   APPENDECTOMY     CAROTID ENDARTERECTOMY  12-07-06   Left CEA   CARPAL TUNNEL RELEASE Left    CATARACT EXTRACTION Bilateral    CHOLECYSTECTOMY     Gall Bladder   COLONOSCOPY WITH PROPOFOL N/A 09/28/2016   Procedure: COLONOSCOPY WITH PROPOFOL;  Surgeon: Rachael Fee, MD;  Location: [facility] ENDOSCOPY;  Service: Endoscopy;  Laterality: N/A;   HEMORRHOID BANDING     PERIPHERAL VASCULAR CATHETERIZATION N/A 02/03/2016   Procedure: Abdominal Aortogram;  Surgeon:  Mal Misty, MD;  Location: [place] CV LAB;  Service: Cardiovascular;  Laterality: N/A;   PERIPHERAL VASCULAR CATHETERIZATION Bilateral 02/03/2016   Procedure: Lower Extremity Angiography;  Surgeon: Mal Misty, MD;  Location: [place] CV LAB;  Service: Cardiovascular;  Laterality: Bilateral;   TONSILLECTOMY       Home Medications:  Prior to Admission medications   Medication Sig Start Date End Date Taking? Authorizing Provider  acetaminophen (TYLENOL) 500 MG tablet Take 1,000 mg by mouth every 8 (eight) hours as  needed for mild pain or moderate pain.   Yes [provider]  aspirin 81 MG chewable tablet Chew 81 mg by mouth every other day.   Yes [provider]  atorvastatin (LIPITOR) 10 MG tablet Take 10 mg by mouth at bedtime. 08/12/21  Yes [provider]  Cholecalciferol 25 MCG (1000 UT) tablet Take 1,000 Units by mouth daily. 09/26/20  Yes [provider]  conjugated estrogens (PREMARIN) vaginal cream Place 0.5 g vaginally 2 (two) times a week. Applies twice a week at bedtime. 06/15/21  Yes [provider]  cyanocobalamin 1000 MCG tablet Take 1,000 mcg by mouth daily.   Yes [provider]  ferrous sulfate 324 MG TBEC Take 324 mg by mouth every other day.   Yes [provider]  levothyroxine (SYNTHROID) 75 MCG tablet Take 75 mcg by mouth daily. 09/10/20  Yes [provider]  melatonin 3 MG TABS tablet Take 3 mg by mouth at bedtime as needed (sleep).   Yes [provider]  mirtazapine (REMERON) 15 MG tablet Take 15 mg by mouth at bedtime. 08/12/21  Yes [provider]  Multiple Vitamin (MULTIVITAMIN) tablet Take 1 tablet by mouth daily.   Yes [provider]  polyethylene glycol (MIRALAX / GLYCOLAX) packet Take 17 g by mouth daily as needed for mild constipation. Patient taking differently: Take 17 g by mouth daily as needed for moderate constipation or mild constipation. 09/29/16  Yes Rogue Bussing, MD    Inpatient Medications: Scheduled Meds:  aspirin  81 mg Oral QODAY   atorvastatin  10 mg Oral QHS   cholecalciferol  1,000 Units Oral Daily   [START ON 08/16/2021] conjugated estrogens  0.5 g Vaginal Once per day on Mon Thu   ferrous sulfate  324 mg Oral QODAY   [START ON 08/16/2021] levothyroxine  75 mcg Oral Daily   methylPREDNISolone (SOLU-MEDROL) injection  1 mg/kg Intravenous Q12H   Followed by   Derrill Memo ON 08/18/2021] predniSONE  50 mg Oral Daily   mirtazapine  15 mg Oral QHS    multivitamin with minerals  1 tablet Oral Daily   pantoprazole (PROTONIX) IV  40 mg Intravenous Q12H   sodium chloride flush  10-40 mL Intracatheter Q12H   cyanocobalamin  1,000 mcg Oral Daily   Continuous Infusions:  lactated ringers 75 mL/hr at 08/15/21 0734   promethazine (PHENERGAN) injection (IM or IVPB) 6.25 mg (08/15/21 1206)   [START ON 08/16/2021] remdesivir 100 mg in NS 100 mL     PRN Meds: acetaminophen, HYDROcodone-acetaminophen, melatonin, ondansetron (ZOFRAN) IV, polyethylene glycol, promethazine (PHENERGAN) injection (IM or IVPB), sodium chloride flush  Allergies:    Allergies  Allergen Reactions   Tape Other (See Comments)    Skin irritation   Atorvastatin Other (See Comments)    MUSCLE PAIN (all statins)   Codeine Nausea Only   Crestor [Rosuvastatin] Other (See Comments)    Leg pain/ joint pain  ( ALL THE STATIN's )  Penicillins Rash    Has patient had a PCN reaction causing immediate rash, facial/tongue/throat swelling, SOB or lightheadedness with hypotension: Yes Has patient had a PCN reaction causing severe rash involving mucus membranes or skin necrosis: No Has patient had a PCN reaction that required hospitalization No Has patient had a PCN reaction occurring within the last 10 years: No If all of the above answers are "NO", then may proceed with Cephalosporin use.    Cephalexin Rash    Social History:   Social History   Socioeconomic History   Marital status: Widowed    Spouse name: Not on file   Number of children: 2   Years of education: Not on file   Highest education level: Not on file  Occupational History   Occupation: retired  Tobacco Use   Smoking status: Former    Types: Cigarettes    Quit date: 08/22/1980    Years since quitting: 41.0   Smokeless tobacco: Never  Vaping Use   Vaping Use: Never used  Substance and Sexual Activity   Alcohol use: No   Drug use: No   Sexual activity: Not on file  Other Topics Concern   Not on file   Social History Narrative   The patient is widowed. She has sons. 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   Social Determinants of Health   Financial Resource Strain: Not on file  Food Insecurity: Not on file  Transportation Needs: Not on file  Physical Activity: Not on file  Stress: Not on file  Social Connections: Not on file  Intimate Partner Violence: Not on file    Family History:    Family History  Problem Relation Age of Onset   Heart disease Father        After age 2    Heart attack Father    Diabetes Sister    Ovarian cancer Sister        may be uterine...   Cancer Brother    Heart disease Brother    Diabetes Brother    Prostate cancer Brother    Stroke Mother    Stroke Sister    Heart disease Brother    Arthritis Brother    Prostate cancer Brother    Heart attack Sister    Diabetes Paternal Grandfather      ROS:  Please see the history of present illness. Patient unable to give her own history, ROS performed with daughter in law  Physical Exam/Data:   Vitals:   08/15/21 0841 08/15/21 0900 08/15/21 1013 08/15/21 1151  BP:  (!) 108/43  (!) 120/49  Pulse:  79  98  Resp:  13  18  Temp:    (!) 97.4 F (36.3 C)  TempSrc:    Oral  SpO2:  100%  100%  Weight: 61.2 kg  56.9 kg   Height:   5\' 5"  (1.651 m)    No intake or output data in the 24 hours ending 08/15/21 1511 Last 3 Weights 08/15/2021 08/15/2021 11/09/2020  Weight (lbs) 125 lb 7.1 oz 135 lb 140 lb  Weight (kg) 56.9 kg 61.236 kg 63.504 kg     Body mass index is 20.87 kg/m.  GEN: Frail/cachectic appearing elderly woman, very hard of hearing. In no acute distress NECK: No JVD CARDIAC: regular rhythm, normal S1 and S2, no rubs or gallops. No murmur. VASCULAR: Radial pulses 2+ bilaterally.  RESPIRATORY:  Clear to auscultation without rales, wheezing or rhonchi  ABDOMEN: Soft, non-tender, non-distended  MUSCULOSKELETAL:  Moves all 4 limbs independently SKIN: Warm and dry, no  edema NEUROLOGIC:  No focal neuro deficits noted. PSYCHIATRIC:  Normal affect    EKG:  The EKG was personally reviewed and demonstrates:  NSR Telemetry:  Telemetry was personally reviewed and demonstrates:  NSR  Relevant CV Studies: Echo 09/21/20 (care everywhere)   1. Limited study to assess mitral valve function.    2. The left ventricle is normal in size with normal wall thickness.    3. The left ventricular systolic function is normal, LVEF is visually  estimated at > 55%.    4. The left atrium is dilated in size.    5. There is mild mitral valve regurgitation.   Echo 10/31/19 (care everywhere)   1. The left ventricle is normal in size with normal wall thickness.    2. The left ventricular systolic function is normal, LVEF is visually  estimated at > 55%.    3. The mitral valve leaflets are mildly thickened with normal leaflet  mobility.    4. There is mild mitral valve regurgitation.    5. The aortic valve is trileaflet with moderately thickened leaflets with  mildly reduced excursion.    6. The right ventricle is normal in size, with normal systolic function.   Laboratory Data:  High Sensitivity Troponin:   Recent Labs  Lab 08/15/21 0359 08/15/21 0559  TROPONINIHS 25* 24*     Chemistry Recent Labs  Lab 08/15/21 0359  NA 136  K 3.6  CL 104  CO2 24  GLUCOSE 142*  BUN 12  CREATININE 0.63  CALCIUM 8.2*  GFRNONAA >60  ANIONGAP 8    Recent Labs  Lab 08/15/21 0359  PROT 6.3*  ALBUMIN 2.7*  AST 43*  ALT 14  ALKPHOS 97  BILITOT 1.3*   Lipids No results for input(s): CHOL, TRIG, HDL, LABVLDL, LDLCALC, CHOLHDL in the last 168 hours.  Hematology Recent Labs  Lab 08/15/21 0359  WBC 5.2  RBC 3.12*  HGB 10.9*  HCT 32.0*  MCV 102.6*  MCH 34.9*  MCHC 34.1  RDW 18.9*  PLT 397   Thyroid No results for input(s): TSH, FREET4 in the last 168 hours.  BNP Recent Labs  Lab 08/15/21 0717  BNP 808.4*    DDimer  Recent Labs  Lab 08/15/21 0717  DDIMER  3.41*     Radiology/Studies:  CT Abdomen Pelvis Wo Contrast  Result Date: 08/15/2021 CLINICAL DATA:  86 year old female with vomiting and rectal pain. Abdominal aortic aneurysm treated with endograft. EXAM: CT ABDOMEN AND PELVIS WITHOUT CONTRAST TECHNIQUE: Multidetector CT imaging of the abdomen and pelvis was performed following the standard protocol without IV contrast. COMPARISON:  CTA Abdomen and Pelvis 12/21/2016. FINDINGS: Lower chest: Extensive calcified coronary artery atherosclerosis. Partially visible tortuous thoracic aorta with calcified plaque. No pericardial effusion. Stable borderline cardiomegaly. Stable mild lung base scarring. No pleural effusion. Hepatobiliary: Chronically absent gallbladder. Negative noncontrast liver. Pancreas: Atrophy. Spleen: Negative. Adrenals/Urinary Tract: Negative adrenal glands. Nonobstructed kidneys. Nephrolithiasis versus renal vascular calcification. Diminutive ureters and bladder. Incidental pelvic phleboliths. Stomach/Bowel: Redundant large bowel with intermittent retained stool increased compared to 2018. No large bowel inflammation identified. Normal appendix on coronal image 37. Decompressed terminal ileum. No dilated small bowel. Decompressed stomach and duodenum. Small volume of simple density fluid in the stomach and the duodenum. No free air. No free fluid. Vascular/Lymphatic: Extensive Aortoiliac calcified atherosclerosis. Chronic bifurcated abdominal aortic endograft. However, the underlying infrarenal abdominal aortic aneurysm has substantially progressed since 2018,  now up to 83 mm maximum transverse diameter (series 2, image 48) versus 59 mm in 2018. Aneurysm length of 90 mm has also increased from 82 mm in 2018. But there is no Peri aortic hematoma or inflammatory stranding identified. Vascular patency is not evaluated in the absence of IV contrast. No lymphadenopathy. Reproductive: Negative. Other: No pelvic free fluid. Musculoskeletal: Severe  T12 compression fracture with near vertebra plana (series 6, image 66) is new since 2018, and appears unhealed with associated AVN, vacuum phenomena. Minimal retropulsion of bone with no associated spinal stenosis. Osteopenia. Other visualized osseous structures appears stable. IMPRESSION: 1. Substantially progressed infrarenal abdominal aortic aneurysm since 2018 despite endograft. Native aneurysm sac now up to 8.3 cm maximum transverse diameter versus 5.9 cm in 2018. No para aortic hematoma or inflammatory stranding. Recommend Vascular Surgery consultation. This recommendation follows ACR consensus guidelines: White Paper of the ACR Incidental Findings Committee II on Vascular Findings. J Am Coll Radiol 2013; 10:789-794. Aortic Atherosclerosis (ICD10-I70.0). Aortic aneurysm NOS (ICD10-I71.9). 2. Severe T12 compression fracture with near vertebra plana is new since 2018, and appears acute or subacute with associated AVN. No significant retropulsion or other complicating features. 3. No other acute or inflammatory process identified in the non-contrast abdomen or pelvis. Electronically Signed   By: Genevie Ann M.D.   On: 08/15/2021 04:51   DG Chest Port 1 View  Result Date: 08/15/2021 CLINICAL DATA:  86 year old female with vomiting. EXAM: PORTABLE CHEST 1 VIEW COMPARISON:  Chest radiographs 06/23/2017 and earlier. FINDINGS: Portable AP upright view at 0419 hours. Calcified aortic atherosclerosis. Right Common carotid area vascular stent appears to be new since 2018. Other mediastinal contours are within normal limits. Visualized tracheal air column is within normal limits. Chronic increased reticular opacity in the right upper lobe appears stable since 2018. Otherwise Allowing for portable technique the lungs are clear. No pneumothorax. Negative visible bowel gas. Stable cholecystectomy clips. Partially visible chronic abdominal aortic endograft. IMPRESSION: No acute cardiopulmonary abnormality. Aortic  Atherosclerosis (ICD10-I70.0). Right common carotid vascular stent appears to be new since 2018. Chronic abdominal aortic endograft. Electronically Signed   By: Genevie Ann M.D.   On: 08/15/2021 04:31   CT Angio Chest/Abd/Pel for Dissection W and/or W/WO  Result Date: 08/15/2021 CLINICAL DATA:  Abdominal aortic aneurysm, nausea, vomiting EXAM: CT ANGIOGRAPHY CHEST, ABDOMEN AND PELVIS TECHNIQUE: Multidetector CT imaging through the chest, abdomen and pelvis was performed using the standard protocol during bolus administration of intravenous contrast. Multiplanar reconstructed images and MIPs were obtained and reviewed to evaluate the vascular anatomy. CONTRAST:  160mL OMNIPAQUE IOHEXOL 350 MG/ML SOLN COMPARISON:  None. FINDINGS: CTA CHEST FINDINGS Cardiovascular: There is homogeneous enhancement in thoracic aorta. There is ectasia of ascending thoracic aorta measuring 4 cm. There are intraluminal filling defects in the segmental and subsegmental pulmonary artery branches in the posteromedial right lower lobe. No other definite intraluminal filling defects are seen. There are no definite signs of acute right ventricular strain. Mediastinum/Nodes: No significant lymphadenopathy is seen. There is fluid in the lumen of thoracic esophagus suggesting gastroesophageal reflux. Lungs/Pleura: Small patchy infiltrates are seen in the posterior right apex. There are linear densities in left lower lung fields. There is no pleural effusion or pneumothorax. Musculoskeletal: Marked compression fracture is seen in the body of T12 vertebra which was not seen in an earlier examination done on 12/21/2016. Review of the MIP images confirms the above findings. CTA ABDOMEN AND PELVIS FINDINGS VASCULAR Aorta: There is ectasia of proximal abdominal aorta with atherosclerotic  plaques and calcifications. There is infrarenal aortic aneurysm measuring 8.1 x 6.3 cm. Infrarenal aortic aneurysm measured 5.9 x 5 cm in the examination of 12/21/2016.  There is previous endovascular stent repair in the infrarenal abdominal aortic aneurysm. There are calcifications are seen in the lumen of infrarenal aortic aneurysm. There is linear tubular contrast filled structure in the upper aspect of infrarenal aortic aneurysm which appears to terminate within the native aneurysm. Celiac: Patent SMA: Patent Renals: Patent IMA: Small caliber IMA is patent. Veins: Unremarkable. Review of the MIP images confirms the above findings. NON-VASCULAR Hepatobiliary: There is fatty infiltration in the liver. Surgical clips are seen in gallbladder fossa. Pancreas: No focal abnormality is seen. Spleen: Unremarkable. Adrenals/Urinary Tract: Adrenals are unremarkable. There is no hydronephrosis. There is cortical thinning. Are no renal or ureteral stones. Stomach/Bowel: Small hiatal hernia is seen. There is fluid in the thoracic esophagus suggesting gastroesophageal reflux. There is no significant small bowel dilation. Appendix is not dilated. There is no significant wall thickening in colon. There is no pericolic stranding. Lymphatic: Unremarkable. Reproductive: Unremarkable. Other: There is no ascites or pneumoperitoneum. Musculoskeletal: Degenerative changes are noted in the pubic symphysis. There is marked compression of anterior aspect of body of T12 vertebra. There is minimal anterolisthesis at L3-L4 level. Review of the MIP images confirms the above findings. IMPRESSION: There is acute pulmonary embolism in the right lower lung fields with small thrombus burden. There is no evidence of acute right ventricular strain. Small patchy infiltrate is seen in the right upper lobe suggesting pneumonia. Linear densities in the left lower lung fields suggest scarring or subsegmental atelectasis. There is large infrarenal aortic aneurysm measuring up to 8.1 cm in diameter. There is significant interval increase in size of aneurysm in comparison with previous study done on 12/21/2016. There are  few scattered calcifications in the native aneurysm. There is a tubular contrast filled structure in the upper aspect of native aneurysm suggesting possible endoleak. There is interval appearance of marked compression fracture in the anterior aspect of body of T12 vertebra. There is no evidence of intestinal obstruction or pneumoperitoneum. There is no hydronephrosis. There is no ascites or pneumoperitoneum. Fatty liver. Gastroesophageal reflux. Imaging finding of acute pulmonary embolism and significant increase in size of infrarenal aortic aneurysm were relayed to Dr. Rowe Pavy by telephone call. Electronically Signed   By: Elmer Picker M.D.   On: 08/15/2021 14:27     Assessment and Plan:   Preoperative cardiovascular evaluation:  Need for surgery still being determined at this point. Unclear if would be open vascular surgery, which in itself is high risk, or if there are any endovascular options. She has historically been felt to be frail. Per Dr. Luther Parody noted 11/09/20, she was not felt to be a candidate for explant of her stent graft or open aorta repair at that time.   Once need for surgery/type of intervention is determined, we can do a more formal risk assessment.  Reports remote [phone]) history of MI, I cannot find any information regarding a prior cardiac cath.   Her risk likely comes more from her overall health status and significant PAD history. Unlikely that a stress test would change management.  I had a long discussion with the patient's daughter in law, who is her primary caregiver now. We discussed that with her frailty, she would likely not recover well from a major surgery. Daughter in law is concerned, feels like she has gradually been getting more frail, not eating, etc. It has  been a hard year, with the loss of the patient's son/daughter in [place] husband this summer, who was her primary caregiver. Patient's memory also waxes and wanes, and she has been dealing with orthostasis  and syncope for some time.  Patient's daughter in law leaning towards no open surgeries but wonders if an endovascular option is available. She will wait until she can discuss more with the vascular team.  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  Overall her frailty is her biggest risk. She has not had cardiac issues since 1982, but with her extensive PAD, it is likely she has some degree of CAD, even if not symptomatic.  We will follow up to leave additional recommendations once we know the planned procedure.  Very complex case, multiple comorbidities. High level of medical decision making. High risk of clinical decompensation.  Risk Assessment/Risk Scores:   N/A  For questions or updates, please contact [facility] Please consult [email or web address] for contact info under    Signed, Buford Dresser, MD  08/15/2021 3:11 PM

## 2021-08-15 NOTE — Progress Notes (Signed)
A consult was placed to the IV Therapist to evaluate the midline catheter that was placed in the ER earlier today;  attempted to pull the line back to flush, but it was kinked and occluded; midline was pulled;  pt has a PIV in the RAC however the pump continues to alarm for occlusion there also;  pt with extremely poor peripheral access;  spent significant amount of time assessing this pt this morning;  the midline had been placed in the brachial vein, as there were no other suitable, healthy veins. Will have the IV RN for next shift assess the pt for more access.  Patient's son at the bedside.

## 2021-08-15 NOTE — Consult Note (Signed)
ORTHOPAEDIC CONSULTATION  REQUESTING PHYSICIAN: [name], DO  Chief Complaint:   Back pain.  History of Present Illness: Margaret Castillo is a 86 y.o. female with multiple medical problems including peripheral vascular disease, coronary artery disease, status post MI, a AAA treated with an endovascular stent previously, hypothyroidism, and polymyalgia rheumatica who is a resident at a local nursing home.  The patient apparently developed a 1 day history of nausea and vomiting with coffee-ground emesis, so she was brought to the emergency room.  A CT scan of her abdomen and pelvis was obtained which demonstrated a fairly significant T12 compression fracture of unclear age, but new as compared to studies from 2018.  The patient notes that she has noted some increased lower back discomfort over the past several months, but denies any recent change in her lower back symptoms.  She recalls falling about a month ago, but does not feel that this aggravated her back symptoms.  The patient denies any numbness or paresthesias down either leg to either foot, and denies any recent changes in bowel or bladder habits.  Past Medical History:  Diagnosis Date   AAA (abdominal aortic aneurysm)    Anemia    Arthritis    Neck   Bladder prolapse, female, acquired    Carotid artery occlusion    Endoleak post endovascular aneurysm repair [facility]), Type I 02/2016   [facility] for aortic cuff placement   Hearing aid worn    Hypothyroidism    Myocardial infarction [facility]) 1982   Polymyalgia rheumatica ([place])    Prolapsed internal hemorrhoids, grade 3    Shingles    Uterine prolapse    Past Surgical History:  Procedure Laterality Date   ABDOMINAL AORTIC ANEURYSM REPAIR  2008   Infrarenal aneurysm stent graft repair   ABDOMINAL AORTIC ENDOVASCULAR STENT GRAFT N/A 02/29/2016   Procedure: Aortic cuff/Gore;  Surgeon: Rosetta Posner, MD;   Location: [place];  Service: Vascular;  Laterality: N/A;   APPENDECTOMY     CAROTID ENDARTERECTOMY  12-07-06   Left CEA   CARPAL TUNNEL RELEASE Left    CATARACT EXTRACTION Bilateral    CHOLECYSTECTOMY     Gall Bladder   COLONOSCOPY WITH PROPOFOL N/A 09/28/2016   Procedure: COLONOSCOPY WITH PROPOFOL;  Surgeon: Milus Banister, MD;  Location: [place];  Service: Endoscopy;  Laterality: N/A;   HEMORRHOID BANDING     PERIPHERAL VASCULAR CATHETERIZATION N/A 02/03/2016   Procedure: Abdominal Aortogram;  Surgeon: Mal Misty, MD;  Location: [place] [facility];  Service: Cardiovascular;  Laterality: N/A;   PERIPHERAL VASCULAR CATHETERIZATION Bilateral 02/03/2016   Procedure: Lower Extremity Angiography;  Surgeon: Mal Misty, MD;  Location: [place] [facility];  Service: Cardiovascular;  Laterality: Bilateral;   TONSILLECTOMY     Social History   Socioeconomic History   Marital status: Widowed    Spouse name: Not on file   Number of children: 2   Years of education: Not on file   Highest education level: Not on file  Occupational History   Occupation: retired  Tobacco Use   Smoking status: Former    Types: Cigarettes    Quit date: 08/22/1980    Years since quitting: 41.0   Smokeless tobacco: Never  Vaping Use   Vaping Use: Never used  Substance and Sexual Activity   Alcohol use: No   Drug use: No   Sexual activity: Not on file  Other Topics Concern   Not on file  Social History Narrative   The patient is widowed. She has sons. 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   Social Determinants of Health   Financial Resource Strain: Not on file  Food Insecurity: Not on file  Transportation Needs: Not on file  Physical Activity: Not on file  Stress: Not on file  Social Connections: Not on file   Family History  Problem Relation Age of Onset   Heart disease Father        After age 66    Heart attack Father    Diabetes Sister    Ovarian cancer Sister        may be  uterine...   Cancer Brother    Heart disease Brother    Diabetes Brother    Prostate cancer Brother    Stroke Mother    Stroke Sister    Heart disease Brother    Arthritis Brother    Prostate cancer Brother    Heart attack Sister    Diabetes Paternal Grandfather    Allergies  Allergen Reactions   Tape Other (See Comments)    Skin irritation   Atorvastatin Other (See Comments)    MUSCLE PAIN (all statins)   Codeine Nausea Only   Crestor [Rosuvastatin] Other (See Comments)    Leg pain/ joint pain  ( ALL THE STATIN's )   Penicillins Rash    Has patient had a PCN reaction causing immediate rash, facial/tongue/throat swelling, SOB or lightheadedness with hypotension: Yes Has patient had a PCN reaction causing severe rash involving mucus membranes or skin necrosis: No Has patient had a PCN reaction that required hospitalization No Has patient had a PCN reaction occurring within the last 10 years: No If all of the above answers are "NO", then may proceed with Cephalosporin use.    Cephalexin Rash   Prior to Admission medications   Medication Sig Start Date End Date Taking? Authorizing Provider  acetaminophen (TYLENOL) 500 MG tablet Take 1,000 mg by mouth every 8 (eight) hours as needed for mild pain or moderate pain.   Yes [provider]  aspirin 81 MG chewable tablet Chew 81 mg by mouth every other day.   Yes [provider]  atorvastatin (LIPITOR) 10 MG tablet Take 10 mg by mouth at bedtime. 08/12/21  Yes [provider]  Cholecalciferol 25 MCG (1000 UT) tablet Take 1,000 Units by mouth daily. 09/26/20  Yes [provider]  conjugated estrogens (PREMARIN) vaginal cream Place 0.5 g vaginally 2 (two) times a week. Applies twice a week at bedtime. 06/15/21  Yes [provider]  cyanocobalamin 1000 MCG tablet Take 1,000 mcg by mouth daily.   Yes [provider]  ferrous sulfate 324 MG TBEC Take 324 mg by mouth every other day.   Yes  [provider]  levothyroxine (SYNTHROID) 75 MCG tablet Take 75 mcg by mouth daily. 09/10/20  Yes [provider]  melatonin 3 MG TABS tablet Take 3 mg by mouth at bedtime as needed (sleep).   Yes [provider]  mirtazapine (REMERON) 15 MG tablet Take 15 mg by mouth at bedtime. 08/12/21  Yes [provider]  Multiple Vitamin (MULTIVITAMIN) tablet Take 1 tablet by mouth daily.   Yes [provider]  polyethylene glycol (MIRALAX / GLYCOLAX) packet Take 17 g by mouth daily as needed for mild constipation. Patient taking differently: Take 17 g by mouth daily as needed for moderate constipation or mild constipation. 09/29/16  Yes Ola Spurr, Ramiro Harvest  Moen, MD   CT Abdomen Pelvis Wo Contrast  Result Date: 08/15/2021 CLINICAL DATA:  86 year old female with vomiting and rectal pain. Abdominal aortic aneurysm treated with endograft. EXAM: CT ABDOMEN AND PELVIS WITHOUT CONTRAST TECHNIQUE: Multidetector CT imaging of the abdomen and pelvis was performed following the standard protocol without IV contrast. COMPARISON:  CTA Abdomen and Pelvis 12/21/2016. FINDINGS: Lower chest: Extensive calcified coronary artery atherosclerosis. Partially visible tortuous thoracic aorta with calcified plaque. No pericardial effusion. Stable borderline cardiomegaly. Stable mild lung base scarring. No pleural effusion. Hepatobiliary: Chronically absent gallbladder. Negative noncontrast liver. Pancreas: Atrophy. Spleen: Negative. Adrenals/Urinary Tract: Negative adrenal glands. Nonobstructed kidneys. Nephrolithiasis versus renal vascular calcification. Diminutive ureters and bladder. Incidental pelvic phleboliths. Stomach/Bowel: Redundant large bowel with intermittent retained stool increased compared to 2018. No large bowel inflammation identified. Normal appendix on coronal image 37. Decompressed terminal ileum. No dilated small bowel. Decompressed stomach and duodenum. Small volume of simple  density fluid in the stomach and the duodenum. No free air. No free fluid. Vascular/Lymphatic: Extensive Aortoiliac calcified atherosclerosis. Chronic bifurcated abdominal aortic endograft. However, the underlying infrarenal abdominal aortic aneurysm has substantially progressed since 2018, now up to 83 mm maximum transverse diameter (series 2, image 48) versus 59 mm in 2018. Aneurysm length of 90 mm has also increased from 82 mm in 2018. But there is no Peri aortic hematoma or inflammatory stranding identified. Vascular patency is not evaluated in the absence of IV contrast. No lymphadenopathy. Reproductive: Negative. Other: No pelvic free fluid. Musculoskeletal: Severe T12 compression fracture with near vertebra plana (series 6, image 66) is new since 2018, and appears unhealed with associated AVN, vacuum phenomena. Minimal retropulsion of bone with no associated spinal stenosis. Osteopenia. Other visualized osseous structures appears stable. IMPRESSION: 1. Substantially progressed infrarenal abdominal aortic aneurysm since 2018 despite endograft. Native aneurysm sac now up to 8.3 cm maximum transverse diameter versus 5.9 cm in 2018. No para aortic hematoma or inflammatory stranding. Recommend Vascular Surgery consultation. This recommendation follows ACR consensus guidelines: White Paper of the ACR Incidental Findings Committee II on Vascular Findings. J Am Coll Radiol 2013; 10:789-794. Aortic Atherosclerosis (ICD10-I70.0). Aortic aneurysm NOS (ICD10-I71.9). 2. Severe T12 compression fracture with near vertebra plana is new since 2018, and appears acute or subacute with associated AVN. No significant retropulsion or other complicating features. 3. No other acute or inflammatory process identified in the non-contrast abdomen or pelvis. Electronically Signed   By: Odessa Fleming M.D.   On: 08/15/2021 04:51   DG Chest Port 1 View  Result Date: 08/15/2021 CLINICAL DATA:  86 year old female with vomiting. EXAM: PORTABLE  CHEST 1 VIEW COMPARISON:  Chest radiographs 06/23/2017 and earlier. FINDINGS: Portable AP upright view at 0419 hours. Calcified aortic atherosclerosis. Right Common carotid area vascular stent appears to be new since 2018. Other mediastinal contours are within normal limits. Visualized tracheal air column is within normal limits. Chronic increased reticular opacity in the right upper lobe appears stable since 2018. Otherwise Allowing for portable technique the lungs are clear. No pneumothorax. Negative visible bowel gas. Stable cholecystectomy clips. Partially visible chronic abdominal aortic endograft. IMPRESSION: No acute cardiopulmonary abnormality. Aortic Atherosclerosis (ICD10-I70.0). Right common carotid vascular stent appears to be new since 2018. Chronic abdominal aortic endograft. Electronically Signed   By: Odessa Fleming M.D.   On: 08/15/2021 04:31    Positive ROS: All other systems have been reviewed and were otherwise negative with the exception of those mentioned in the HPI and as above.  Physical Exam: General:  Alert,  no acute distress Psychiatric:  Patient is competent for consent with normal mood and affect   Cardiovascular:  No pedal edema Respiratory:  No wheezing, non-labored breathing GI:  Abdomen is soft and non-tender Skin:  No lesions in the area of chief complaint Neurologic:  Sensation intact distally Lymphatic:  No axillary or cervical lymphadenopathy  Orthopedic Exam:  Orthopedic examination is limited to the lumbar spine and lower extremities.  Skin inspection of the patient's back is unremarkable.  Her spine is notable for a mildly kyphotic deformity, but no swelling, erythema, ecchymosis, abrasions, or other skin abnormalities are identified.  She has only minimal tenderness to palpation along the mid and lower spine regions, but does not exhibit moderate focal tenderness over the T12 level.  She is neurovascularly intact to both lower extremities, demonstrating the ability to  dorsiflex and plantarflex her toes and ankles easily.  Sensation is intact to light touch to all distributions.  She has good capillary refill to both feet.  X-rays:  A recent CT scan of the abdomen and pelvis is available for review and has been reviewed by myself.  The findings of the study are as described above.  There does appear to be a rather substantial compression fracture of T12 of unclear age.  Of more concern is the 9 cm abdominal aortic aneurysm.  Assessment: Significant T12 compression fracture of unclear age.  Plan: The treatment options have been discussed with the patient and her grandson, who is at the bedside.  The patient does not appear to be too symptomatic in regards to the T12 region at this time, although she does note that she does have increased discomfort with standing or ambulating.  I do not feel that she is a candidate for a kyphoplasty at this time, but she might benefit from a Jewett type back extension brace to wear as necessary for comfort when sitting up or standing.  The only concern with this brace would be that it may place increased pressure over the abdomen in the area of the abdominal aortic aneurysm.  The patient may be mobilized as tolerated with physical therapy, assuming she is stable from a medical standpoint.  Thank you for asking me to participate in the care of this most pleasant yet unfortunate woman.  I will be happy to follow her with you.   Pascal Lux, MD  Beeper #:  [phone]  08/15/2021 11:32 AM

## 2021-08-15 NOTE — ED Provider Notes (Signed)
[facility] Emergency Department Provider Note   ____________________________________________   Event Date/Time   First MD Initiated Contact with Patient 08/15/21 [phone]     (approximate)  I have reviewed the triage vital signs and the nursing notes.   HISTORY  Chief Complaint Emesis    HPI Margaret Castillo is a 86 y.o. female brought to the ED via EMS from SNF for nausea/vomiting x1 day.  Patient denies chest pain, shortness of breath, abdominal pain.  Also endorses some diarrhea.  Denies fever, chills or cough.     Past Medical History:  Diagnosis Date   AAA (abdominal aortic aneurysm)    Anemia    Arthritis    Neck   Bladder prolapse, female, acquired    Carotid artery occlusion    Endoleak post endovascular aneurysm repair [place]), Type I 02/2016   [facility] for aortic cuff placement   Hearing aid worn    Hypothyroidism    Myocardial infarction [facility]) 1982   Polymyalgia rheumatica (HCC)    Prolapsed internal hemorrhoids, grade 3    Shingles    Uterine prolapse     Patient Active Problem List   Diagnosis Date Noted   Gastroenteritis due to COVID-19 virus 08/15/2021   GIB (gastrointestinal bleeding) 03/22/2018   Near syncope    Polymyalgia rheumatica (HCC) 06/23/2017   Symptomatic hypotension 06/23/2017   Prolapsed internal hemorrhoids, grade 2 and 3    Rectal bleeding 09/27/2016   Acute blood loss anemia    AAA (abdominal aortic aneurysm) 02/29/2016   Occlusion and stenosis of carotid artery without mention of cerebral infarction 08/09/2012    Past Surgical History:  Procedure Laterality Date   ABDOMINAL AORTIC ANEURYSM REPAIR  2008   Infrarenal aneurysm stent graft repair   ABDOMINAL AORTIC ENDOVASCULAR STENT GRAFT N/A 02/29/2016   Procedure: Aortic cuff/Gore;  Surgeon: Larina Earthly, MD;  Location: [facility] OR;  Service: Vascular;  Laterality: N/A;   APPENDECTOMY     CAROTID ENDARTERECTOMY  12-07-06   Left CEA   CARPAL TUNNEL  RELEASE Left    CATARACT EXTRACTION Bilateral    CHOLECYSTECTOMY     Gall Bladder   COLONOSCOPY WITH PROPOFOL N/A 09/28/2016   Procedure: COLONOSCOPY WITH PROPOFOL;  Surgeon: Rachael Fee, MD;  Location: [facility] ENDOSCOPY;  Service: Endoscopy;  Laterality: N/A;   HEMORRHOID BANDING     PERIPHERAL VASCULAR CATHETERIZATION N/A 02/03/2016   Procedure: Abdominal Aortogram;  Surgeon: Pryor Ochoa, MD;  Location: [facility] INVASIVE CV LAB;  Service: Cardiovascular;  Laterality: N/A;   PERIPHERAL VASCULAR CATHETERIZATION Bilateral 02/03/2016   Procedure: Lower Extremity Angiography;  Surgeon: Pryor Ochoa, MD;  Location: [facility] INVASIVE CV LAB;  Service: Cardiovascular;  Laterality: Bilateral;   TONSILLECTOMY      Prior to Admission medications   Medication Sig Start Date End Date Taking? Authorizing Provider  acetaminophen (TYLENOL) 500 MG tablet Take 500-1,000 mg by mouth as needed for mild pain.     [provider]  Cholecalciferol 25 MCG (1000 UT) tablet Take by mouth. 09/26/20   [provider]  cyanocobalamin 1000 MCG tablet Take by mouth.    [provider]  docusate sodium (COLACE) 100 MG capsule Take 1 capsule (100 mg total) by mouth 2 (two) times daily. 03/26/18   Ramonita Lab, MD  levothyroxine (SYNTHROID) 75 MCG tablet Take 1 tablet by mouth daily. 09/10/20   [provider]  Multiple Vitamin (MULTIVITAMIN) tablet Take 1 tablet by mouth daily.  [provider]  polyethylene glycol (MIRALAX / GLYCOLAX) packet Take 17 g by mouth daily as needed for mild constipation. Patient taking differently: Take 17 g by mouth 2 (two) times daily. 09/29/16   Rogue Bussing, MD  predniSONE (DELTASONE) 5 MG tablet Take 4 tablets (20 mg total) by mouth daily with breakfast. Patient taking differently: Take 30 mg by mouth daily with breakfast. 08/03/17   Gatha Mayer, MD  rosuvastatin (CRESTOR) 5 MG tablet Take 5 mg by mouth at bedtime.    [provider]     Allergies Atorvastatin, Codeine, Crestor [rosuvastatin], Penicillins, Adhesive [tape], and Cephalexin  Family History  Problem Relation Age of Onset   Heart disease Father        After age 97    Heart attack Father    Diabetes Sister    Ovarian cancer Sister        may be uterine...   Cancer Brother    Heart disease Brother    Diabetes Brother    Prostate cancer Brother    Stroke Mother    Stroke Sister    Heart disease Brother    Arthritis Brother    Prostate cancer Brother    Heart attack Sister    Diabetes Paternal Grandfather     Social History Social History   Tobacco Use   Smoking status: Former    Types: Cigarettes    Quit date: 08/22/1980    Years since quitting: 41.0   Smokeless tobacco: Never  Vaping Use   Vaping Use: Never used  Substance Use Topics   Alcohol use: No   Drug use: No    Review of Systems  Constitutional: No fever/chills Eyes: No visual changes. ENT: No sore throat. Cardiovascular: Denies chest pain. Respiratory: Denies shortness of breath. Gastrointestinal: No abdominal pain.  Positive for nausea, vomiting and diarrhea.  No constipation. Genitourinary: Negative for dysuria. Musculoskeletal: Negative for back pain. Skin: Negative for rash. Neurological: Negative for headaches, focal weakness or numbness.   ____________________________________________   PHYSICAL EXAM:  VITAL SIGNS: ED Triage Vitals  Enc Vitals Group     BP      Pulse      Resp      Temp      Temp src      SpO2      Weight      Height      Head Circumference      Peak Flow      Pain Score      Pain Loc      Pain Edu?      Excl. in [place]?     Constitutional: Alert and oriented.  Elderly appearing and in mild acute distress. Eyes: Conjunctivae are normal. PERRL. EOMI. Head: Atraumatic. Nose: No congestion/rhinnorhea. Mouth/Throat: Mucous membranes are mildly dry. Neck: No stridor.   Cardiovascular: Normal rate, regular rhythm. Grossly normal  heart sounds.  Good peripheral circulation. Respiratory: Normal respiratory effort.  No retractions. Lungs CTAB. Gastrointestinal: Soft and nontender to light or deep palpation. No distention. No abdominal bruits. No CVA tenderness. Musculoskeletal: No lower extremity tenderness nor edema.  No joint effusions. Neurologic:  Normal speech and language. No gross focal neurologic deficits are appreciated.  Skin:  Skin is warm, dry and intact. No rash noted. Psychiatric: Mood and affect are normal. Speech and behavior are normal.  ____________________________________________   LABS (all labs ordered are listed, but only abnormal results are displayed)  Labs Reviewed  RESP PANEL BY  RT-PCR (FLU A&B, COVID) ARPGX2 - Abnormal; Notable for the following components:      Result Value   SARS Coronavirus 2 by RT PCR POSITIVE (*)    All other components within normal limits  CBC WITH DIFFERENTIAL/PLATELET - Abnormal; Notable for the following components:   RBC 3.12 (*)    Hemoglobin 10.9 (*)    HCT 32.0 (*)    MCV 102.6 (*)    MCH 34.9 (*)    RDW 18.9 (*)    nRBC 0.4 (*)    All other components within normal limits  COMPREHENSIVE METABOLIC PANEL - Abnormal; Notable for the following components:   Glucose, Bld 142 (*)    Calcium 8.2 (*)    Total Protein 6.3 (*)    Albumin 2.7 (*)    AST 43 (*)    Total Bilirubin 1.3 (*)    All other components within normal limits  TROPONIN I (HIGH SENSITIVITY) - Abnormal; Notable for the following components:   Troponin I (High Sensitivity) 25 (*)    All other components within normal limits  LIPASE, BLOOD  LACTIC ACID, PLASMA  LACTIC ACID, PLASMA  URINALYSIS, ROUTINE W REFLEX MICROSCOPIC  BRAIN NATRIURETIC PEPTIDE  C-REACTIVE PROTEIN  D-DIMER, QUANTITATIVE  FERRITIN  FIBRINOGEN  HEPATITIS B SURFACE ANTIGEN  LACTATE DEHYDROGENASE  PROCALCITONIN  ABO/RH  TYPE AND SCREEN  TROPONIN I (HIGH SENSITIVITY)    ____________________________________________  EKG  ED ECG REPORT I, Jurell Basista J, the attending physician, personally viewed and interpreted this ECG.   Date: 08/15/2021  EKG Time: 0409  Rate: 87  Rhythm: normal sinus rhythm  Axis: Normal  Intervals: QTC 541  ST&T Change: Nonspecific  ____________________________________________  RADIOLOGY I, Sukhdeep Wieting J, personally viewed and evaluated these images (plain radiographs) as part of my medical decision making, as well as reviewing the written report by the radiologist.  ED MD interpretation: No acute cardiopulmonary process; no acute intra-abdominal process, increased size of unruptured infrarenal AAA  Official radiology report(s): CT Abdomen Pelvis Wo Contrast  Result Date: 08/15/2021 CLINICAL DATA:  86 year old female with vomiting and rectal pain. Abdominal aortic aneurysm treated with endograft. EXAM: CT ABDOMEN AND PELVIS WITHOUT CONTRAST TECHNIQUE: Multidetector CT imaging of the abdomen and pelvis was performed following the standard protocol without IV contrast. COMPARISON:  CTA Abdomen and Pelvis 12/21/2016. FINDINGS: Lower chest: Extensive calcified coronary artery atherosclerosis. Partially visible tortuous thoracic aorta with calcified plaque. No pericardial effusion. Stable borderline cardiomegaly. Stable mild lung base scarring. No pleural effusion. Hepatobiliary: Chronically absent gallbladder. Negative noncontrast liver. Pancreas: Atrophy. Spleen: Negative. Adrenals/Urinary Tract: Negative adrenal glands. Nonobstructed kidneys. Nephrolithiasis versus renal vascular calcification. Diminutive ureters and bladder. Incidental pelvic phleboliths. Stomach/Bowel: Redundant large bowel with intermittent retained stool increased compared to 2018. No large bowel inflammation identified. Normal appendix on coronal image 37. Decompressed terminal ileum. No dilated small bowel. Decompressed stomach and duodenum. Small volume of simple  density fluid in the stomach and the duodenum. No free air. No free fluid. Vascular/Lymphatic: Extensive Aortoiliac calcified atherosclerosis. Chronic bifurcated abdominal aortic endograft. However, the underlying infrarenal abdominal aortic aneurysm has substantially progressed since 2018, now up to 83 mm maximum transverse diameter (series 2, image 48) versus 59 mm in 2018. Aneurysm length of 90 mm has also increased from 82 mm in 2018. But there is no Peri aortic hematoma or inflammatory stranding identified. Vascular patency is not evaluated in the absence of IV contrast. No lymphadenopathy. Reproductive: Negative. Other: No pelvic free fluid. Musculoskeletal: Severe T12 compression fracture with  near vertebra plana (series 6, image 103) is new since 2018, and appears unhealed with associated AVN, vacuum phenomena. Minimal retropulsion of bone with no associated spinal stenosis. Osteopenia. Other visualized osseous structures appears stable. IMPRESSION: 1. Substantially progressed infrarenal abdominal aortic aneurysm since 2018 despite endograft. Native aneurysm sac now up to 8.3 cm maximum transverse diameter versus 5.9 cm in 2018. No para aortic hematoma or inflammatory stranding. Recommend Vascular Surgery consultation. This recommendation follows ACR consensus guidelines: White Paper of the ACR Incidental Findings Committee II on Vascular Findings. J Am Coll Radiol 2013; 10:789-794. Aortic Atherosclerosis (ICD10-I70.0). Aortic aneurysm NOS (ICD10-I71.9). 2. Severe T12 compression fracture with near vertebra plana is new since 2018, and appears acute or subacute with associated AVN. No significant retropulsion or other complicating features. 3. No other acute or inflammatory process identified in the non-contrast abdomen or pelvis. Electronically Signed   By: Genevie Ann M.D.   On: 08/15/2021 04:51   DG Chest Port 1 View  Result Date: 08/15/2021 CLINICAL DATA:  86 year old female with vomiting. EXAM: PORTABLE  CHEST 1 VIEW COMPARISON:  Chest radiographs 06/23/2017 and earlier. FINDINGS: Portable AP upright view at 0419 hours. Calcified aortic atherosclerosis. Right Common carotid area vascular stent appears to be new since 2018. Other mediastinal contours are within normal limits. Visualized tracheal air column is within normal limits. Chronic increased reticular opacity in the right upper lobe appears stable since 2018. Otherwise Allowing for portable technique the lungs are clear. No pneumothorax. Negative visible bowel gas. Stable cholecystectomy clips. Partially visible chronic abdominal aortic endograft. IMPRESSION: No acute cardiopulmonary abnormality. Aortic Atherosclerosis (ICD10-I70.0). Right common carotid vascular stent appears to be new since 2018. Chronic abdominal aortic endograft. Electronically Signed   By: Genevie Ann M.D.   On: 08/15/2021 04:31    ____________________________________________   PROCEDURES  Procedure(s) performed (including Critical Care):  .1-3 Lead EKG Interpretation Performed by: Paulette Blanch, MD Authorized by: Paulette Blanch, MD   Comments:     Patient placed on cardiac monitor to evaluate for arrhythmias   ____________________________________________   INITIAL IMPRESSION / ASSESSMENT AND PLAN / ED COURSE  As part of my medical decision making, I reviewed the following data within the [place] notes reviewed and incorporated, Labs reviewed, EKG interpreted, Old chart reviewed, Radiograph reviewed, and Notes from prior ED visits     86 year old female presenting with nausea/vomiting. Differential diagnosis includes, but is not limited to, ovarian cyst, ovarian torsion, acute appendicitis, diverticulitis, urinary tract infection/pyelonephritis, endometriosis, bowel obstruction, colitis, renal colic, gastroenteritis, hernia, etc.   Will obtain lab work, imaging studies.  Initiate IV fluid resuscitation.  Will reassess.  Clinical Course as  of 08/15/21 [id number]  Nancy Fetter Aug 15, 2021  [id number] Patient vomited again; large amount of brown-colored emesis.  Son at bedside.  Updated them both on laboratory and imaging results thus far.  Will discuss with hospitalist services for admission. [JS]  [id number] Patient is COVID+; will initiate treatment with remdesivir. [JS]    Clinical Course User Index [JS] Paulette Blanch, MD     ____________________________________________   FINAL CLINICAL IMPRESSION(S) / ED DIAGNOSES  Final diagnoses:  Nausea and vomiting, unspecified vomiting type  COVID-19     ED Discharge Orders     None        Note:  This document was prepared using Dragon voice recognition software and may include unintentional dictation errors.    Paulette Blanch, MD 08/15/21 [phone]

## 2021-08-15 NOTE — Progress Notes (Signed)
CTA reviewed. Discussed transfer with Vascular Surgery at [facility]. Complexity noted. Recommended transfer to [facility] for advanced Endovascular intervention. Hospitalist to arrange transfer.

## 2021-08-15 NOTE — ED Triage Notes (Signed)
Pt arrives via [facility] from  [facility]. CC of vomiting brown or black emesis. No known bowel movement today. Pt reports rectal pain. EMS VS: HR 91 106/62 100% RA

## 2021-08-15 NOTE — Progress Notes (Signed)
ANTICOAGULATION CONSULT NOTE  Pharmacy Consult for heparin infusion Indication: pulmonary embolus  Allergies  Allergen Reactions   Tape Other (See Comments)    Skin irritation   Atorvastatin Other (See Comments)    MUSCLE PAIN (all statins)   Codeine Nausea Only   Crestor [Rosuvastatin] Other (See Comments)    Leg pain/ joint pain  ( ALL THE STATIN's )   Penicillins Rash    Has patient had a PCN reaction causing immediate rash, facial/tongue/throat swelling, SOB or lightheadedness with hypotension: Yes Has patient had a PCN reaction causing severe rash involving mucus membranes or skin necrosis: No Has patient had a PCN reaction that required hospitalization No Has patient had a PCN reaction occurring within the last 10 years: No If all of the above answers are "NO", then may proceed with Cephalosporin use.    Cephalexin Rash    Patient Measurements: Height: 5\' 5"  (165.1 cm) Weight: 56.9 kg (125 lb 7.1 oz) IBW/kg (Calculated) : 57 kg   Vital Signs: Temp: 98.2 F (36.8 C) (12/25 1605) Temp Source: Oral (12/25 1605) BP: 111/64 (12/25 1605) Pulse Rate: 76 (12/25 1605)  Labs: Recent Labs    08/15/21 0359 08/15/21 0559 08/15/21 1605  HGB 10.9*  --  9.6*  HCT 32.0*  --  28.0*  PLT 397  --   --   CREATININE 0.63  --   --   TROPONINIHS 25* 24*  --     Estimated Creatinine Clearance: 47 mL/min (by C-G formula based on SCr of 0.63 mg/dL).   Medical History: Past Medical History:  Diagnosis Date   AAA (abdominal aortic aneurysm)    Anemia    Arthritis    Neck   Bladder prolapse, female, acquired    Carotid artery occlusion    Endoleak post endovascular aneurysm repair [facility]), Type I 02/2016   [facility] for aortic cuff placement   Hearing aid worn    Hypothyroidism    Myocardial infarction [facility]) 1982   Polymyalgia rheumatica (HCC)    Prolapsed internal hemorrhoids, grade 3    Shingles    Uterine prolapse     Medications:  Per chart review, no  anticoagulation noted prior to admission  Assessment: 86 y.o. female with a past medical history of hypothyroidism, hyperlipidemia, AAA status post endograft repair, carotid artery disease status post carotid enterectomy. Pharmacy has been consulted for heparin.   Goal of Therapy:  Heparin level 0.3-0.7 units/ml Monitor platelets by anticoagulation protocol: Yes   Plan:  Give 4000 units bolus x 1 Start heparin infusion at 1000 units/hr Check anti-Xa level in 8 hours and daily while on heparin Continue to monitor H&H and platelets  Nivin Braniff O Atharv Barriere 08/15/2021,5:00 PM

## 2021-08-15 NOTE — Progress Notes (Signed)
PHARMACY NOTE:  ANTIMICROBIAL RENAL DOSAGE ADJUSTMENT  Current antimicrobial regimen includes a mismatch between antimicrobial dosage and estimated renal function.  As per policy approved by the Pharmacy & Therapeutics and Medical Executive Committees, the antimicrobial dosage will be adjusted accordingly.  Current antimicrobial dosage:  Levofloxacin 750 mg daily for 5 doses  Indication: CAP  Renal Function:  Estimated Creatinine Clearance: 47 mL/min (by C-G formula based on SCr of 0.63 mg/dL).    Antimicrobial dosage has been changed to:  Levofloxacin q48h for 3 doses  Additional comments:   Thank you for allowing pharmacy to be a part of this patient's care.  Robyne Peers Camyra Vaeth, [facility] 08/15/2021 3:56 PM

## 2021-08-15 NOTE — H&P (Addendum)
H&P:    Margaret Castillo   WUJ:[id number] DOB: Apr 17, 1935 DOA: 08/15/2021  PCP: Suzzanne CloudLarson, Brittany Marie, MD  Chief Complaint: Nausea, vomiting   History of Present Illness:    HPI: Margaret PickingLouise T Castillo is a 86 y.o. female with a past medical history of hypothyroidism, hyperlipidemia, AAA status post endograft repair, carotid artery disease status post carotid enterectomy  The patient is very hard of hearing.  Did have 1 episode of brown emesis at bedside.  Son present at bedside assisting with the history.  Son states that she is vaccinated for COVID-19.  Did have a fall a few weeks ago at nursing home but has not been complaining of any back pain.  No chest pain.  No shortness of breath.  Nausea and vomiting started early this morning at local nursing home.  No aggravating or alleviating factors.  No fevers or chills.  ED Course: The patient tested positive for COVID-19.  EKG showed sinus rhythm with a rate of 87.  Chest x-ray showed no acute abnormalities.  CT abdomen pelvis without contrast demonstrated acute or subacute T12 compression fracture and progressing infrarenal abdominal aortic aneurysm from 5.9 to 8.3 cm with vascular surgery consultation recommended.  Labs are fairly unremarkable except for hemoglobin 10.9 and hematocrit 32.  Troponins minimally elevated initially at 25 and repeat flattened at 24.  1 L normal saline bolus was given and Zofran 4 mg IV was given as well.    ROS:   14 point review of systems is negative except for what is mentioned above in the HPI.   Past Medical History:   Past Medical History:  Diagnosis Date   AAA (abdominal aortic aneurysm)    Anemia    Arthritis    Neck   Bladder prolapse, female, acquired    Carotid artery occlusion    Endoleak post endovascular aneurysm repair [facility](HCC), Type I 02/2016   [facility]   Hearing aid worn    Hypothyroidism    Myocardial infarction [facility](HCC) 1982   Polymyalgia  rheumatica (HCC)    Prolapsed internal hemorrhoids, grade 3    Shingles    Uterine prolapse     Past Surgical History:   Past Surgical History:  Procedure Laterality Date   ABDOMINAL AORTIC ANEURYSM REPAIR  2008   Infrarenal aneurysm stent graft repair   ABDOMINAL AORTIC ENDOVASCULAR STENT GRAFT N/A 02/29/2016   Procedure: Aortic cuff/Gore;  Surgeon: Larina Earthlyodd F Early, MD;  [facility];  Service: Vascular;  Laterality: N/A;   APPENDECTOMY     CAROTID ENDARTERECTOMY  12-07-06   Left CEA   CARPAL TUNNEL RELEASE Left    CATARACT EXTRACTION Bilateral    CHOLECYSTECTOMY     Gall Bladder   COLONOSCOPY WITH PROPOFOL N/A 09/28/2016   Procedure: COLONOSCOPY WITH PROPOFOL;  Surgeon: Rachael Feeaniel P Jacobs, MD;  [facility];  Service: Endoscopy;  Laterality: N/A;   HEMORRHOID BANDING     PERIPHERAL VASCULAR CATHETERIZATION N/A 02/03/2016   Procedure: Abdominal Aortogram;  Surgeon: Pryor OchoaJames D Lawson, MD;  [facility];  Service: Cardiovascular;  Laterality: N/A;   PERIPHERAL VASCULAR CATHETERIZATION Bilateral 02/03/2016   Procedure: Lower Extremity Angiography;  Surgeon: Pryor OchoaJames D Lawson, MD;  [facility];  Service: Cardiovascular;  Laterality: Bilateral;   TONSILLECTOMY      Social History:   Social History   Socioeconomic History   Marital status: Widowed    Spouse name: Not on file  Number of children: 2   Years of education: Not on file   Highest education level: Not on file  Occupational History   Occupation: retired  Tobacco Use   Smoking status: Former    Types: Cigarettes    Quit date: 08/22/1980    Years since quitting: 41.0   Smokeless tobacco: Never  Vaping Use   Vaping Use: Never used  Substance and Sexual Activity   Alcohol use: No   Drug use: No   Sexual activity: Not on file  Other Topics Concern   Not on file  Social History Narrative   The patient is widowed. She has sons. 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    Social Determinants of Health   Financial Resource Strain: Not on file  Food Insecurity: Not on file  Transportation Needs: Not on file  Physical Activity: Not on file  Stress: Not on file  Social Connections: Not on file  Intimate Partner Violence: Not on file    Allergies:   Allergies  Allergen Reactions   Atorvastatin Other (See Comments)    MUSCLE PAIN (all statins)   Codeine Nausea Only   Crestor [Rosuvastatin] Other (See Comments)    Leg pain/ joint pain  ( ALL THE STATIN's )   Penicillins Rash    Has patient had a PCN reaction causing immediate rash, facial/tongue/throat swelling, SOB or lightheadedness with hypotension: Yes Has patient had a PCN reaction causing severe rash involving mucus membranes or skin necrosis: No Has patient had a PCN reaction that required hospitalization No Has patient had a PCN reaction occurring within the last 10 years: No If all of the above answers are "NO", then may proceed with Cephalosporin use.    Adhesive [Tape] Other (See Comments)   Cephalexin Rash    Family History:   Family History  Problem Relation Age of Onset   Heart disease Father        After age 22    Heart attack Father    Diabetes Sister    Ovarian cancer Sister        may be uterine...   Cancer Brother    Heart disease Brother    Diabetes Brother    Prostate cancer Brother    Stroke Mother    Stroke Sister    Heart disease Brother    Arthritis Brother    Prostate cancer Brother    Heart attack Sister    Diabetes Paternal Grandfather      Current Medications:   Prior to Admission medications   Medication Sig Start Date End Date Taking? Authorizing Provider  acetaminophen (TYLENOL) 500 MG tablet Take 500-1,000 mg by mouth as needed for mild pain.     [provider]  Cholecalciferol 25 MCG (1000 UT) tablet Take by mouth. 09/26/20   [provider]  cyanocobalamin 1000 MCG tablet Take by mouth.    [provider]  docusate  sodium (COLACE) 100 MG capsule Take 1 capsule (100 mg total) by mouth 2 (two) times daily. 03/26/18   Nicholes Mango, MD  levothyroxine (SYNTHROID) 75 MCG tablet Take 1 tablet by mouth daily. 09/10/20   [provider]  Multiple Vitamin (MULTIVITAMIN) tablet Take 1 tablet by mouth daily.    [provider]  polyethylene glycol (MIRALAX / GLYCOLAX) packet Take 17 g by mouth daily as needed for mild constipation. Patient taking differently: Take 17 g by mouth 2 (two) times daily. 09/29/16   Rogue Bussing, MD  predniSONE (DELTASONE) 5 MG tablet Take 4 tablets (20 mg total) by mouth daily with breakfast. Patient taking differently: Take 30 mg by mouth daily with breakfast. 08/03/17   Iva Boop, MD  rosuvastatin (CRESTOR) 5 MG tablet Take 5 mg by mouth at bedtime.    [provider]     Physical Exam:   Vitals:   08/15/21 0450 08/15/21 0500 08/15/21 0530 08/15/21 0600  BP: (!) 83/61 90/67 99/82    Pulse: 84 85 98 81  Resp: 13 16 14  (!) 24  Temp:      TempSrc:      SpO2: 100% 100% 100% 100%     General:  Appears calm and comfortable and is in NAD, very hard of hearing, dry mucous membranes and oral mucosa Cardiovascular:  RRR, no m/r/g.  Respiratory:   CTA bilaterally with no wheezes/rales/rhonchi.  Normal respiratory effort. Abdomen:  soft, NT, ND, NABS Skin:  no rash or induration seen on limited exam Musculoskeletal:  grossly normal tone BUE/BLE, good ROM, no bony abnormality Lower extremity:  No LE edema.  Limited foot exam with no ulcerations.  2+ distal pulses. Psychiatric:  grossly normal mood and affect, speech fluent and appropriate, Aox2 to name and place Neurologic:  CN 2-12 grossly intact, moves all extremities in coordinated fashion, sensation intact    Data Review:    Radiological Exams on Admission: Independently reviewed - see discussion in A/P where applicable  CT Abdomen Pelvis Wo Contrast  Result Date: 08/15/2021 CLINICAL  DATA:  86 year old female with vomiting and rectal pain. Abdominal aortic aneurysm treated with endograft. EXAM: CT ABDOMEN AND PELVIS WITHOUT CONTRAST TECHNIQUE: Multidetector CT imaging of the abdomen and pelvis was performed following the standard protocol without IV contrast. COMPARISON:  CTA Abdomen and Pelvis 12/21/2016. FINDINGS: Lower chest: Extensive calcified coronary artery atherosclerosis. Partially visible tortuous thoracic aorta with calcified plaque. No pericardial effusion. Stable borderline cardiomegaly. Stable mild lung base scarring. No pleural effusion. Hepatobiliary: Chronically absent gallbladder. Negative noncontrast liver. Pancreas: Atrophy. Spleen: Negative. Adrenals/Urinary Tract: Negative adrenal glands. Nonobstructed kidneys. Nephrolithiasis versus renal vascular calcification. Diminutive ureters and bladder. Incidental pelvic phleboliths. Stomach/Bowel: Redundant large bowel with intermittent retained stool increased compared to 2018. No large bowel inflammation identified. Normal appendix on coronal image 37. Decompressed terminal ileum. No dilated small bowel. Decompressed stomach and duodenum. Small volume of simple density fluid in the stomach and the duodenum. No free air. No free fluid. Vascular/Lymphatic: Extensive Aortoiliac calcified atherosclerosis. Chronic bifurcated abdominal aortic endograft. However, the underlying infrarenal abdominal aortic aneurysm has substantially progressed since 2018, now up to 83 mm maximum transverse diameter (series 2, image 48) versus 59 mm in 2018. Aneurysm length of 90 mm has also increased from 82 mm in 2018. But there is no Peri aortic hematoma or inflammatory stranding identified. Vascular patency is not evaluated in the absence of IV contrast. No lymphadenopathy. Reproductive: Negative. Other: No pelvic free fluid. Musculoskeletal: Severe T12 compression fracture with near vertebra plana (series 6, image 66) is new since 2018, and appears  unhealed with associated AVN, vacuum phenomena. Minimal retropulsion of bone with no associated spinal stenosis. Osteopenia. Other visualized osseous structures appears stable. IMPRESSION: 1. Substantially progressed infrarenal abdominal aortic aneurysm since 2018 despite endograft. Native aneurysm sac now up to 8.3 cm maximum transverse diameter versus 5.9 cm in 2018. No para aortic hematoma or inflammatory stranding. Recommend Vascular Surgery consultation. This recommendation follows ACR consensus guidelines: White Paper of the ACR Incidental Findings Committee II on Vascular Findings.  J Am Coll Radiol 2013; 10:789-794. Aortic Atherosclerosis (ICD10-I70.0). Aortic aneurysm NOS (ICD10-I71.9). 2. Severe T12 compression fracture with near vertebra plana is new since 2018, and appears acute or subacute with associated AVN. No significant retropulsion or other complicating features. 3. No other acute or inflammatory process identified in the non-contrast abdomen or pelvis. Electronically Signed   By: Odessa Fleming M.D.   On: 08/15/2021 04:51   DG Chest Port 1 View  Result Date: 08/15/2021 CLINICAL DATA:  86 year old female with vomiting. EXAM: PORTABLE CHEST 1 VIEW COMPARISON:  Chest radiographs 06/23/2017 and earlier. FINDINGS: Portable AP upright view at 0419 hours. Calcified aortic atherosclerosis. Right Common carotid area vascular stent appears to be new since 2018. Other mediastinal contours are within normal limits. Visualized tracheal air column is within normal limits. Chronic increased reticular opacity in the right upper lobe appears stable since 2018. Otherwise Allowing for portable technique the lungs are clear. No pneumothorax. Negative visible bowel gas. Stable cholecystectomy clips. Partially visible chronic abdominal aortic endograft. IMPRESSION: No acute cardiopulmonary abnormality. Aortic Atherosclerosis (ICD10-I70.0). Right common carotid vascular stent appears to be new since 2018. Chronic  abdominal aortic endograft. Electronically Signed   By: Odessa Fleming M.D.   On: 08/15/2021 04:31    EKG: Independently reviewed.  SR 87 bpm   Labs on Admission: I have personally reviewed the available labs and imaging studies at the time of the admission.  Pertinent labs on Admission: Hemoglobin 10.9, hematocrit 32, initial troponin 25, repeat troponin 24; COVID-19 PCR positive.     Assessment/Plan:    Intractable nausea vomiting and diarrhea secondary to COVID-19 viral gastroenteritis: Start remdesivir and IV steroids.  Supportive care with antiemetics as needed.  Start maintenance IV fluids with lactated Ringer's.  One reported episode of brown emesis in the ED.  We will obtain a gastric occult.  Start PPI IV twice daily in the interim. N.p.o. and advance diet as tolerated.  Hold any chemical DVT prophylaxis for now.  Monitor hemodynamics closely as her blood pressures are currently near hypotension.  GI consult if gastric occult is positive.  Elevated high-sensitivity troponins: Minimally elevated and now flattened.  Likely secondary to demand ischemia.  Denies any chest pain.  AAA: With history of endograft repair.  CT abdomen pelvis without contrast demonstrated progressing native infrarenal abdominal aortic aneurysm from 5.9 to 8.3 cm.  We will consult vascular surgery.  T12 compression fracture: Imaging noted acute or subacute T12 compression fracture with AVN.  We will consult orthopedics.  Hypothyroidism: Continue home Synthroid  Hyperlipidemia: Continue home Lipitor  Iron deficiency anemia: Continue home ferrous sulfate  Insomnia: Continue home Remeron and melatonin  Carotid artery disease: Continue home Lipitor and aspirin   Other information:    Level of Care: MedSurg DVT prophylaxis: SCDs Code Status: Full code Consults: Vascular surgery, Ortho Admission status: Inpatient   Verdia Kuba DO [facility]   How to contact the [facility] Attending or Consulting  provider 7A - 7P or covering provider during after hours 7P -7A, for this patient?  Check the care team in [facility] and look for a) attending/consulting [facility] provider listed and b) the [facility] team listed Log into [email or web address] and use [facility] universal password to access. If you do not have the password, please contact the hospital operator. Locate the [facility] provider you are looking for under [facility] and page to a number that you can be directly reached. If you still have difficulty reaching the provider, please page the [facility] (  Director on Call) for the Hospitalists listed on amion for assistance.   08/15/2021, 6:58 AM

## 2021-08-15 NOTE — Consult Note (Signed)
[facility] Vascular Consult Note  MRN : [id number]  Margaret Castillo is a 86 y.o. (19-Nov-1934) female who presents with chief complaint of  Chief Complaint  Patient presents with   Emesis  .  History of Present Illness: Patient admitted with COVID gastroenteritis. History of EVAR prior to 2017. Subsequently found to have a small Type 1a and Type 2 endoleaks in 2017/2018 at [facility]. The aortic sac was stable at that time and no intervention was performed. Unclear if there was any further follow up. Patient presents with COVID + gastroenteritis. Found on a non contrst CT to have expansion of her AAA to 8.3 without stranding or evidence of rupture. Patient also has a T12 compression fracture. + Nausea, + emesis, denied abdominal pain. Some low back pain that has been intermittent over the last several months.  Current Facility-Administered Medications  Medication Dose Route Frequency Provider Last Rate Last Admin   acetaminophen (TYLENOL) tablet 650 mg  650 mg Oral Q6H PRN Lynwood Dawley S, DO       aspirin chewable tablet 81 mg  81 mg Oral QODAY Anwar, Shayan S, DO   81 mg at 08/15/21 1155   atorvastatin (LIPITOR) tablet 10 mg  10 mg Oral QHS Lynwood Dawley S, DO       cholecalciferol (VITAMIN D3) tablet 1,000 Units  1,000 Units Oral Daily Lynwood Dawley S, DO   1,000 Units at 08/15/21 1155   [START ON 08/16/2021] conjugated estrogens (PREMARIN) vaginal cream 0.25 Applicatorful  0.5 g Vaginal Once per day on Mon Thu Anwar, Shayan S, DO       ferrous sulfate tablet 324 mg  324 mg Oral Avie Arenas, Shayan S, DO   324 mg at 08/15/21 1155   HYDROcodone-acetaminophen (NORCO/VICODIN) 5-325 MG per tablet 1 tablet  1 tablet Oral Q6H PRN Lynwood Dawley S, DO   1 tablet at 08/15/21 1155   iohexol (OMNIPAQUE) 350 MG/ML injection 100 mL  100 mL Intravenous Once PRN Lynwood Dawley S, DO       lactated ringers infusion   Intravenous Continuous Verdia Kuba, DO 75 mL/hr at 08/15/21  0734 New Bag at 08/15/21 0734   [START ON 08/16/2021] levothyroxine (SYNTHROID) tablet 75 mcg  75 mcg Oral Daily Anwar, Shayan S, DO       melatonin tablet 3 mg  3 mg Oral QHS PRN Lynwood Dawley S, DO       methylPREDNISolone sodium succinate (SOLU-MEDROL) 125 mg/2 mL injection 61.25 mg  1 mg/kg Intravenous Q12H Irean Hong, MD       Followed by   Melene Muller ON 08/18/2021] predniSONE (DELTASONE) tablet 50 mg  50 mg Oral Daily Irean Hong, MD       mirtazapine (REMERON) tablet 15 mg  15 mg Oral QHS Lynwood Dawley S, DO       multivitamin with minerals tablet 1 tablet  1 tablet Oral Daily Lynwood Dawley S, DO   1 tablet at 08/15/21 1155   ondansetron (ZOFRAN) injection 4 mg  4 mg Intravenous Q6H PRN Lynwood Dawley S, DO       pantoprazole (PROTONIX) injection 40 mg  40 mg Intravenous Q12H Anwar, Hunt Oris S, DO   40 mg at 08/15/21 1155   polyethylene glycol (MIRALAX / GLYCOLAX) packet 17 g  17 g Oral Daily PRN Lynwood Dawley S, DO       promethazine (PHENERGAN) 6.25 mg in sodium chloride 0.9 % 50 mL IVPB  6.25 mg  Intravenous Q6H PRN Imagene Sheller S, DO 200 mL/hr at 08/15/21 1206 6.25 mg at 08/15/21 1206   [START ON 08/16/2021] remdesivir 100 mg in sodium chloride 0.9 % 100 mL IVPB  100 mg Intravenous Daily Paulette Blanch, MD       sodium chloride flush (NS) 0.9 % injection 10-40 mL  10-40 mL Intracatheter Q12H Poggi, Marshall Cork, MD   10 mL at 08/15/21 1203   sodium chloride flush (NS) 0.9 % injection 10-40 mL  10-40 mL Intracatheter PRN Poggi, Marshall Cork, MD       vitamin B-12 (CYANOCOBALAMIN) tablet 1,000 mcg  1,000 mcg Oral Daily Imagene Sheller S, DO   1,000 mcg at 08/15/21 1155    Past Medical History:  Diagnosis Date   AAA (abdominal aortic aneurysm)    Anemia    Arthritis    Neck   Bladder prolapse, female, acquired    Carotid artery occlusion    Endoleak post endovascular aneurysm repair [facility]), Type I 02/2016   [facility] for aortic cuff placement   Hearing aid worn    Hypothyroidism     Myocardial infarction [facility]) 1982   Polymyalgia rheumatica ([place])    Prolapsed internal hemorrhoids, grade 3    Shingles    Uterine prolapse     Past Surgical History:  Procedure Laterality Date   ABDOMINAL AORTIC ANEURYSM REPAIR  2008   Infrarenal aneurysm stent graft repair   ABDOMINAL AORTIC ENDOVASCULAR STENT GRAFT N/A 02/29/2016   Procedure: Aortic cuff/Gore;  Surgeon: Rosetta Posner, MD;  Location: [place];  Service: Vascular;  Laterality: N/A;   APPENDECTOMY     CAROTID ENDARTERECTOMY  12-07-06   Left CEA   CARPAL TUNNEL RELEASE Left    CATARACT EXTRACTION Bilateral    CHOLECYSTECTOMY     Gall Bladder   COLONOSCOPY WITH PROPOFOL N/A 09/28/2016   Procedure: COLONOSCOPY WITH PROPOFOL;  Surgeon: Milus Banister, MD;  Location: [place];  Service: Endoscopy;  Laterality: N/A;   HEMORRHOID BANDING     PERIPHERAL VASCULAR CATHETERIZATION N/A 02/03/2016   Procedure: Abdominal Aortogram;  Surgeon: Mal Misty, MD;  Location: [place] CV LAB;  Service: Cardiovascular;  Laterality: N/A;   PERIPHERAL VASCULAR CATHETERIZATION Bilateral 02/03/2016   Procedure: Lower Extremity Angiography;  Surgeon: Mal Misty, MD;  Location: [place] CV LAB;  Service: Cardiovascular;  Laterality: Bilateral;   TONSILLECTOMY      Social History Social History   Tobacco Use   Smoking status: Former    Types: Cigarettes    Quit date: 08/22/1980    Years since quitting: 41.0   Smokeless tobacco: Never  Vaping Use   Vaping Use: Never used  Substance Use Topics   Alcohol use: No   Drug use: No    Family History Family History  Problem Relation Age of Onset   Heart disease Father        After age 51    Heart attack Father    Diabetes Sister    Ovarian cancer Sister        may be uterine...   Cancer Brother    Heart disease Brother    Diabetes Brother    Prostate cancer Brother    Stroke Mother    Stroke Sister    Heart disease Brother    Arthritis Brother    Prostate cancer Brother     Heart attack Sister    Diabetes Paternal Grandfather     Allergies  Allergen Reactions  Tape Other (See Comments)    Skin irritation   Atorvastatin Other (See Comments)    MUSCLE PAIN (all statins)   Codeine Nausea Only   Crestor [Rosuvastatin] Other (See Comments)    Leg pain/ joint pain  ( ALL THE STATIN's )   Penicillins Rash    Has patient had a PCN reaction causing immediate rash, facial/tongue/throat swelling, SOB or lightheadedness with hypotension: Yes Has patient had a PCN reaction causing severe rash involving mucus membranes or skin necrosis: No Has patient had a PCN reaction that required hospitalization No Has patient had a PCN reaction occurring within the last 10 years: No If all of the above answers are "NO", then may proceed with Cephalosporin use.    Cephalexin Rash     REVIEW OF SYSTEMS (Negative unless checked)  Constitutional: [] Weight loss  [] Fever  [] Chills Cardiac: [] Chest pain   [] Chest pressure   [] Palpitations   [] Shortness of breath when laying flat   [] Shortness of breath at rest   [] Shortness of breath with exertion. Vascular:  [] Pain in legs with walking   [] Pain in legs at rest   [] Pain in legs when laying flat   [] Claudication   [] Pain in feet when walking  [] Pain in feet at rest  [] Pain in feet when laying flat   [] History of DVT   [] Phlebitis   [] Swelling in legs   [] Varicose veins   [] Non-healing ulcers Pulmonary:   [] Uses home oxygen   [] Productive cough   [] Hemoptysis   [] Wheeze  [] COPD   [] Asthma Neurologic:  [] Dizziness  [] Blackouts   [] Seizures   [] History of stroke   [] History of TIA  [] Aphasia   [] Temporary blindness   [] Dysphagia   [] Weakness or numbness in arms   [] Weakness or numbness in legs Musculoskeletal:  [] Arthritis   [] Joint swelling   [] Joint pain   [] Low back pain Hematologic:  [] Easy bruising  [] Easy bleeding   [] Hypercoagulable state   [] Anemic  [] Hepatitis Gastrointestinal:  [] Blood in stool   [] Vomiting blood   [] Gastroesophageal reflux/heartburn   [] Difficulty swallowing. Genitourinary:  [] Chronic kidney disease   [] Difficult urination  [] Frequent urination  [] Burning with urination   [] Blood in urine Skin:  [] Rashes   [] Ulcers   [] Wounds Psychological:  [] History of anxiety   []  History of major depression.  Physical Examination  Vitals:   08/15/21 0841 08/15/21 0900 08/15/21 1013 08/15/21 1151  BP:  (!) 108/43  (!) 120/49  Pulse:  79  98  Resp:  13  18  Temp:    (!) 97.4 F (36.3 C)  TempSrc:    Oral  SpO2:  100%  100%  Weight: 61.2 kg  56.9 kg   Height:   5\' 5"  (1.651 m)    Body mass index is 20.87 kg/m. Gen:  WD/WN, NAD Neck: Trachea midline.  No JVD.  Pulmonary:  Good air movement, respirations not labored, equal bilaterally.  Cardiac: RRR, normal S1, S2. Vascular:  Vessel Right Left  Radial Palpable Palpable  Ulnar    Brachial    Carotid    Aorta palpable N/A  Femoral Palpable Palpable  Popliteal    PT    DP     Gastrointestinal: soft, non-tender/non-distended. No guarding/reflex.  Musculoskeletal: M/S 5/5 throughout.  Extremities without ischemic changes.  No deformity or atrophy. No edema. Neurologic: Sensation grossly intact in extremities.  Symmetrical.  Speech is fluent. Motor exam as listed above. Psychiatric: Judgment intact, Mood & affect appropriate for pt's clinical situation.  Dermatologic: No rashes or ulcers noted.  No cellulitis or open wounds. Lymph : No Cervical, Axillary, or Inguinal lymphadenopathy.     CBC Lab Results  Component Value Date   WBC 5.2 08/15/2021   HGB 10.9 (L) 08/15/2021   HCT 32.0 (L) 08/15/2021   MCV 102.6 (H) 08/15/2021   PLT 397 08/15/2021    BMET    Component Value Date/Time   NA 136 08/15/2021 0359   K 3.6 08/15/2021 0359   CL 104 08/15/2021 0359   CO2 24 08/15/2021 0359   GLUCOSE 142 (H) 08/15/2021 0359   BUN 12 08/15/2021 0359   CREATININE 0.63 08/15/2021 0359   CALCIUM 8.2 (L) 08/15/2021 0359   GFRNONAA >60  08/15/2021 0359   GFRAA >60 03/23/2018 0319   Estimated Creatinine Clearance: 47 mL/min (by C-G formula based on SCr of 0.63 mg/dL).  COAG Lab Results  Component Value Date   INR 1.04 03/22/2018   INR 1.02 09/26/2016   INR 1.26 02/29/2016    Radiology CT Abdomen Pelvis Wo Contrast  Result Date: 08/15/2021 CLINICAL DATA:  86 year old female with vomiting and rectal pain. Abdominal aortic aneurysm treated with endograft. EXAM: CT ABDOMEN AND PELVIS WITHOUT CONTRAST TECHNIQUE: Multidetector CT imaging of the abdomen and pelvis was performed following the standard protocol without IV contrast. COMPARISON:  CTA Abdomen and Pelvis 12/21/2016. FINDINGS: Lower chest: Extensive calcified coronary artery atherosclerosis. Partially visible tortuous thoracic aorta with calcified plaque. No pericardial effusion. Stable borderline cardiomegaly. Stable mild lung base scarring. No pleural effusion. Hepatobiliary: Chronically absent gallbladder. Negative noncontrast liver. Pancreas: Atrophy. Spleen: Negative. Adrenals/Urinary Tract: Negative adrenal glands. Nonobstructed kidneys. Nephrolithiasis versus renal vascular calcification. Diminutive ureters and bladder. Incidental pelvic phleboliths. Stomach/Bowel: Redundant large bowel with intermittent retained stool increased compared to 2018. No large bowel inflammation identified. Normal appendix on coronal image 37. Decompressed terminal ileum. No dilated small bowel. Decompressed stomach and duodenum. Small volume of simple density fluid in the stomach and the duodenum. No free air. No free fluid. Vascular/Lymphatic: Extensive Aortoiliac calcified atherosclerosis. Chronic bifurcated abdominal aortic endograft. However, the underlying infrarenal abdominal aortic aneurysm has substantially progressed since 2018, now up to 83 mm maximum transverse diameter (series 2, image 48) versus 59 mm in 2018. Aneurysm length of 90 mm has also increased from 82 mm in 2018. But  there is no Peri aortic hematoma or inflammatory stranding identified. Vascular patency is not evaluated in the absence of IV contrast. No lymphadenopathy. Reproductive: Negative. Other: No pelvic free fluid. Musculoskeletal: Severe T12 compression fracture with near vertebra plana (series 6, image 66) is new since 2018, and appears unhealed with associated AVN, vacuum phenomena. Minimal retropulsion of bone with no associated spinal stenosis. Osteopenia. Other visualized osseous structures appears stable. IMPRESSION: 1. Substantially progressed infrarenal abdominal aortic aneurysm since 2018 despite endograft. Native aneurysm sac now up to 8.3 cm maximum transverse diameter versus 5.9 cm in 2018. No para aortic hematoma or inflammatory stranding. Recommend Vascular Surgery consultation. This recommendation follows ACR consensus guidelines: White Paper of the ACR Incidental Findings Committee II on Vascular Findings. J Am Coll Radiol 2013; 10:789-794. Aortic Atherosclerosis (ICD10-I70.0). Aortic aneurysm NOS (ICD10-I71.9). 2. Severe T12 compression fracture with near vertebra plana is new since 2018, and appears acute or subacute with associated AVN. No significant retropulsion or other complicating features. 3. No other acute or inflammatory process identified in the non-contrast abdomen or pelvis. Electronically Signed   By: Genevie Ann M.D.   On: 08/15/2021 04:51   DG Chest [facility]  1 View  Result Date: 08/15/2021 CLINICAL DATA:  86 year old female with vomiting. EXAM: PORTABLE CHEST 1 VIEW COMPARISON:  Chest radiographs 06/23/2017 and earlier. FINDINGS: Portable AP upright view at 0419 hours. Calcified aortic atherosclerosis. Right Common carotid area vascular stent appears to be new since 2018. Other mediastinal contours are within normal limits. Visualized tracheal air column is within normal limits. Chronic increased reticular opacity in the right upper lobe appears stable since 2018. Otherwise Allowing for  portable technique the lungs are clear. No pneumothorax. Negative visible bowel gas. Stable cholecystectomy clips. Partially visible chronic abdominal aortic endograft. IMPRESSION: No acute cardiopulmonary abnormality. Aortic Atherosclerosis (ICD10-I70.0). Right common carotid vascular stent appears to be new since 2018. Chronic abdominal aortic endograft. Electronically Signed   By: Genevie Ann M.D.   On: 08/15/2021 04:31      Assessment/Plan 1. Patient with Previous EVAR, prior Endoleaks Type 1a and 2 in 2017/2018, now with expansion to 8.3 with suprarenal involvement 2. Obtain CTA Chest/Abdomen and Pelvis- to evaluate the proximal extent of aneurysm, etiology of leak and EVAR position- Will review once complete. Further recommendations once complete. 3. Complex issue. Will require higher level of care as it is likely this will require advanced endovascular intervention with suprarenal fixation/ branched graft or open repair. The patient is fragile and would have significant morbidity and mortality with open repair. Would recommend transfer to [facility] or other center of Higher care.   Evaristo Bury, MD  08/15/2021 1:12 PM    This note was created with Dragon medical transcription system.  Any error is purely unintentional

## 2021-08-16 DIAGNOSIS — U071 COVID-19: Principal | ICD-10-CM

## 2021-08-16 DIAGNOSIS — K920 Hematemesis: Secondary | ICD-10-CM

## 2021-08-16 DIAGNOSIS — I7143 Infrarenal abdominal aortic aneurysm, without rupture: Secondary | ICD-10-CM

## 2021-08-16 DIAGNOSIS — A0839 Other viral enteritis: Secondary | ICD-10-CM

## 2021-08-16 LAB — CBC
HCT: 26.6 % — ABNORMAL LOW (ref 36.0–46.0)
Hemoglobin: 9.1 g/dL — ABNORMAL LOW (ref 12.0–15.0)
MCH: 35.7 pg — ABNORMAL HIGH (ref 26.0–34.0)
MCHC: 34.2 g/dL (ref 30.0–36.0)
MCV: 104.3 fL — ABNORMAL HIGH (ref 80.0–100.0)
Platelets: 280 10*3/uL (ref 150–400)
RBC: 2.55 MIL/uL — ABNORMAL LOW (ref 3.87–5.11)
RDW: 18.6 % — ABNORMAL HIGH (ref 11.5–15.5)
WBC: 4.4 10*3/uL (ref 4.0–10.5)
nRBC: 0 % (ref 0.0–0.2)

## 2021-08-16 LAB — HEPARIN LEVEL (UNFRACTIONATED)
Heparin Unfractionated: 1.01 IU/mL — ABNORMAL HIGH (ref 0.30–0.70)
Heparin Unfractionated: 0.93 IU/mL — ABNORMAL HIGH (ref 0.30–0.70)
Heparin Unfractionated: 0.84 IU/mL — ABNORMAL HIGH (ref 0.30–0.70)

## 2021-08-16 MED ORDER — HEPARIN (PORCINE) 25000 UT/250ML-% IV SOLN
550.0000 [IU]/h | INTRAVENOUS | Status: AC
  Administered 2021-08-17: 01:00:00 650 [IU]/h via INTRAVENOUS
  Filled 2021-08-16: qty 250

## 2021-08-16 NOTE — Progress Notes (Signed)
ANTICOAGULATION CONSULT NOTE  Pharmacy Consult for heparin infusion Indication: pulmonary embolus  Allergies  Allergen Reactions   Tape Other (See Comments)    Skin irritation   Atorvastatin Other (See Comments)    MUSCLE PAIN (all statins)   Codeine Nausea Only   Crestor [Rosuvastatin] Other (See Comments)    Leg pain/ joint pain  ( ALL THE STATIN's )   Penicillins Rash    Has patient had a PCN reaction causing immediate rash, facial/tongue/throat swelling, SOB or lightheadedness with hypotension: Yes Has patient had a PCN reaction causing severe rash involving mucus membranes or skin necrosis: No Has patient had a PCN reaction that required hospitalization No Has patient had a PCN reaction occurring within the last 10 years: No If all of the above answers are "NO", then may proceed with Cephalosporin use.    Cephalexin Rash    Patient Measurements: Height: 5\' 5"  (165.1 cm) Weight: 56.9 kg (125 lb 7.1 oz) IBW/kg (Calculated) : 57 kg   Vital Signs: Temp: 97.5 F (36.4 C) (12/26 0838) Temp Source: Oral (12/26 0838) BP: 116/78 (12/26 0838) Pulse Rate: 87 (12/26 0838)  Labs: Recent Labs    08/15/21 0359 08/15/21 0559 08/15/21 1605 08/15/21 1802 08/15/21 2219 08/16/21 0304 08/16/21 1444  HGB 10.9*  --  9.6*  --  10.5* 9.1*  --   HCT 32.0*  --  28.0*  --  30.3* 26.6*  --   PLT 397  --   --   --   --  280  --   APTT  --   --   --  33  --   --   --   LABPROT  --   --   --  16.6*  --   --   --   INR  --   --   --  1.3*  --   --   --   HEPARINUNFRC  --   --   --   --   --  1.01* 0.93*  CREATININE 0.63  --   --   --   --   --   --   TROPONINIHS 25* 24*  --   --   --   --   --      Estimated Creatinine Clearance: 47 mL/min (by C-G formula based on SCr of 0.63 mg/dL).   Medical History: Past Medical History:  Diagnosis Date   AAA (abdominal aortic aneurysm)    Anemia    Arthritis    Neck   Bladder prolapse, female, acquired    Carotid artery occlusion     Endoleak post endovascular aneurysm repair [facility]), Type I 02/2016   [facility] for aortic cuff placement   Hearing aid worn    Hypothyroidism    Myocardial infarction [facility]) 1982   Polymyalgia rheumatica ([facility])    Prolapsed internal hemorrhoids, grade 3    Shingles    Uterine prolapse     Medications:  Per chart review, no anticoagulation noted prior to admission  Assessment: 86 y.o. female with a past medical history of hypothyroidism, hyperlipidemia, AAA status post endograft repair, carotid artery disease status post carotid enterectomy. Pharmacy has been consulted for heparin.   Goal of Therapy:  Heparin level 0.3-0.7 units/ml Monitor platelets by anticoagulation protocol: Yes   Plan:  12/26: HL @ 1444 = 0.93, SUPRAtherapeutic  Will decrease heparin drip to 650 units/hr.  Will recheck HL 8 hrs after rate change.  Arsal Tappan A Santresa Levett 08/16/2021,3:32  PM

## 2021-08-16 NOTE — Consult Note (Signed)
Melodie Bouillon, MD [address], [address], [place], [place], [address] [address], [address], [place], [place], [address] Phone: [phone]  Fax: [phone]  Consultation  Referring Provider:     Dr. Linna Darner Primary Care Physician:  [name], MD Reason for Consultation:     Coffee-ground emesis  Date of Admission:  08/15/2021 Date of Consultation:  08/16/2021         HPI:   Margaret Castillo is a 86 y.o. female who is very hard of hearing, presented for nausea vomiting, found to be COVID positive and diagnosed with COVID-19 viral gastroenteritis.  GI being consulted for 1 episode of brown emesis when patient presented to the ED.  Patient found to be COVID-19 positive.  Admitted with T12 compression fracture, progressing infrarenal abdominal aortic aneurysm.  Patient has not had any further episodes of emesis or coffee-ground emesis since initial episode when she initially presented to the ER.  Vascular surgery has recommended transfer to [facility] for advanced endovascular intervention as the only option for patient's vascular findings.  Please see their note from today.  She is on heparin drip  Denies any abdominal pain  Past Medical History:  Diagnosis Date   AAA (abdominal aortic aneurysm)    Anemia    Arthritis    Neck   Bladder prolapse, female, acquired    Carotid artery occlusion    Endoleak post endovascular aneurysm repair [facility]), Type I 02/2016   [facility] for aortic cuff placement   Hearing aid worn    Hypothyroidism    Myocardial infarction [facility]) 1982   Polymyalgia rheumatica (HCC)    Prolapsed internal hemorrhoids, grade 3    Shingles    Uterine prolapse     Past Surgical History:  Procedure Laterality Date   ABDOMINAL AORTIC ANEURYSM REPAIR  2008   Infrarenal aneurysm stent graft repair   ABDOMINAL AORTIC ENDOVASCULAR STENT GRAFT N/A 02/29/2016   Procedure: Aortic cuff/Gore;  Surgeon: Larina Earthly, MD;  Location: [facility]  OR;  Service: Vascular;  Laterality: N/A;   APPENDECTOMY     CAROTID ENDARTERECTOMY  12-07-06   Left CEA   CARPAL TUNNEL RELEASE Left    CATARACT EXTRACTION Bilateral    CHOLECYSTECTOMY     Gall Bladder   COLONOSCOPY WITH PROPOFOL N/A 09/28/2016   Procedure: COLONOSCOPY WITH PROPOFOL;  Surgeon: Rachael Fee, MD;  Location: [facility] ENDOSCOPY;  Service: Endoscopy;  Laterality: N/A;   HEMORRHOID BANDING     PERIPHERAL VASCULAR CATHETERIZATION N/A 02/03/2016   Procedure: Abdominal Aortogram;  Surgeon: Pryor Ochoa, MD;  Location: [facility] INVASIVE CV LAB;  Service: Cardiovascular;  Laterality: N/A;   PERIPHERAL VASCULAR CATHETERIZATION Bilateral 02/03/2016   Procedure: Lower Extremity Angiography;  Surgeon: Pryor Ochoa, MD;  Location: [facility] INVASIVE CV LAB;  Service: Cardiovascular;  Laterality: Bilateral;   TONSILLECTOMY      Prior to Admission medications   Medication Sig Start Date End Date Taking? Authorizing Provider  acetaminophen (TYLENOL) 500 MG tablet Take 1,000 mg by mouth every 8 (eight) hours as needed for mild pain or moderate pain.   Yes [provider]  aspirin 81 MG chewable tablet Chew 81 mg by mouth every other day.   Yes [provider]  atorvastatin (LIPITOR) 10 MG tablet Take 10 mg by mouth at bedtime. 08/12/21  Yes [provider]  Cholecalciferol 25 MCG (1000 UT) tablet Take 1,000 Units by mouth daily. 09/26/20  Yes [provider]  conjugated estrogens (  PREMARIN) vaginal cream Place 0.5 g vaginally 2 (two) times a week. Applies twice a week at bedtime. 06/15/21  Yes [provider]  cyanocobalamin 1000 MCG tablet Take 1,000 mcg by mouth daily.   Yes [provider]  ferrous sulfate 324 MG TBEC Take 324 mg by mouth every other day.   Yes [provider]  levothyroxine (SYNTHROID) 75 MCG tablet Take 75 mcg by mouth daily. 09/10/20  Yes [provider]  melatonin 3 MG TABS tablet Take 3 mg by mouth at  bedtime as needed (sleep).   Yes [provider]  mirtazapine (REMERON) 15 MG tablet Take 15 mg by mouth at bedtime. 08/12/21  Yes [provider]  Multiple Vitamin (MULTIVITAMIN) tablet Take 1 tablet by mouth daily.   Yes [provider]  polyethylene glycol (MIRALAX / GLYCOLAX) packet Take 17 g by mouth daily as needed for mild constipation. Patient taking differently: Take 17 g by mouth daily as needed for moderate constipation or mild constipation. 09/29/16  Yes Rogue Bussing, MD    Family History  Problem Relation Age of Onset   Heart disease Father        After age 55    Heart attack Father    Diabetes Sister    Ovarian cancer Sister        may be uterine...   Cancer Brother    Heart disease Brother    Diabetes Brother    Prostate cancer Brother    Stroke Mother    Stroke Sister    Heart disease Brother    Arthritis Brother    Prostate cancer Brother    Heart attack Sister    Diabetes Paternal Grandfather      Social History   Tobacco Use   Smoking status: Former    Types: Cigarettes    Quit date: 08/22/1980    Years since quitting: 41.0   Smokeless tobacco: Never  Vaping Use   Vaping Use: Never used  Substance Use Topics   Alcohol use: No   Drug use: No    Allergies as of 08/15/2021 - Review Complete 08/15/2021  Allergen Reaction Noted   Tape Other (See Comments) 02/25/2016   Atorvastatin Other (See Comments) 01/20/2016   Codeine Nausea Only 08/03/2012   Crestor [rosuvastatin] Other (See Comments) 08/13/2013   Penicillins Rash 08/03/2012   Cephalexin Rash 01/20/2016    Review of Systems:    All systems reviewed and negative except where noted in HPI.   Physical Exam:  Constitutional: General:   Alert,  Well-developed, well-nourished, pleasant and cooperative in NAD BP 116/78 (BP Location: Left Arm)    Pulse 87    Temp (!) 97.5 F (36.4 C) (Oral)    Resp 16    Ht 5\' 5"  (1.651 m)    Wt 56.9 kg     SpO2 99%    BMI 20.87 kg/m   Eyes:  Sclera clear, no icterus.   Conjunctiva pink. PERRLA  Ears:  No scars, lesions or masses, Normal auditory acuity. Nose:  No deformity, discharge, or lesions. Mouth:  No deformity or lesions, oropharynx pink & moist.  Neck:  Supple; no masses or thyromegaly.  Respiratory: Normal respiratory effort, Normal percussion  Gastrointestinal:  Normal bowel sounds.  No bruits.  Soft, non-tender and non-distended without masses, hepatosplenomegaly or hernias noted.  No guarding or rebound tenderness.     Cardiac: No clubbing or edema.  No cyanosis. Normal posterior tibial pedal pulses noted.  Lymphatic:  No significant cervical or axillary adenopathy.  Psych:  Alert and cooperative. Normal mood and affect.  Musculoskeletal:  Normal gait. Head normocephalic, atraumatic. Symmetrical without gross deformities. 5/5 Upper and Lower extremity strength bilaterally.  Skin: Warm. Intact without significant lesions or rashes. No jaundice.  Neurologic:  Face symmetrical, tongue midline, Normal sensation to touch;  grossly normal neurologically.  Psych:  Alert and oriented x3, Alert and cooperative. Normal mood and affect.   LAB RESULTS: Recent Labs    08/15/21 0359 08/15/21 1605 08/15/21 2219 08/16/21 0304  WBC 5.2  --   --  4.4  HGB 10.9* 9.6* 10.5* 9.1*  HCT 32.0* 28.0* 30.3* 26.6*  PLT 397  --   --  280   BMET Recent Labs    08/15/21 0359  NA 136  K 3.6  CL 104  CO2 24  GLUCOSE 142*  BUN 12  CREATININE 0.63  CALCIUM 8.2*   LFT Recent Labs    08/15/21 0359  PROT 6.3*  ALBUMIN 2.7*  AST 43*  ALT 14  ALKPHOS 97  BILITOT 1.3*   PT/INR Recent Labs    08/15/21 1802  LABPROT 16.6*  INR 1.3*    STUDIES: CT Abdomen Pelvis Wo Contrast  Result Date: 08/15/2021 CLINICAL DATA:  86 year old female with vomiting and rectal pain. Abdominal aortic aneurysm treated with endograft. EXAM: CT ABDOMEN AND PELVIS WITHOUT CONTRAST TECHNIQUE:  Multidetector CT imaging of the abdomen and pelvis was performed following the standard protocol without IV contrast. COMPARISON:  CTA Abdomen and Pelvis 12/21/2016. FINDINGS: Lower chest: Extensive calcified coronary artery atherosclerosis. Partially visible tortuous thoracic aorta with calcified plaque. No pericardial effusion. Stable borderline cardiomegaly. Stable mild lung base scarring. No pleural effusion. Hepatobiliary: Chronically absent gallbladder. Negative noncontrast liver. Pancreas: Atrophy. Spleen: Negative. Adrenals/Urinary Tract: Negative adrenal glands. Nonobstructed kidneys. Nephrolithiasis versus renal vascular calcification. Diminutive ureters and bladder. Incidental pelvic phleboliths. Stomach/Bowel: Redundant large bowel with intermittent retained stool increased compared to 2018. No large bowel inflammation identified. Normal appendix on coronal image 37. Decompressed terminal ileum. No dilated small bowel. Decompressed stomach and duodenum. Small volume of simple density fluid in the stomach and the duodenum. No free air. No free fluid. Vascular/Lymphatic: Extensive Aortoiliac calcified atherosclerosis. Chronic bifurcated abdominal aortic endograft. However, the underlying infrarenal abdominal aortic aneurysm has substantially progressed since 2018, now up to 83 mm maximum transverse diameter (series 2, image 48) versus 59 mm in 2018. Aneurysm length of 90 mm has also increased from 82 mm in 2018. But there is no Peri aortic hematoma or inflammatory stranding identified. Vascular patency is not evaluated in the absence of IV contrast. No lymphadenopathy. Reproductive: Negative. Other: No pelvic free fluid. Musculoskeletal: Severe T12 compression fracture with near vertebra plana (series 6, image 66) is new since 2018, and appears unhealed with associated AVN, vacuum phenomena. Minimal retropulsion of bone with no associated spinal stenosis. Osteopenia. Other visualized osseous structures  appears stable. IMPRESSION: 1. Substantially progressed infrarenal abdominal aortic aneurysm since 2018 despite endograft. Native aneurysm sac now up to 8.3 cm maximum transverse diameter versus 5.9 cm in 2018. No para aortic hematoma or inflammatory stranding. Recommend Vascular Surgery consultation. This recommendation follows ACR consensus guidelines: White Paper of the ACR Incidental Findings Committee II on Vascular Findings. J Am Coll Radiol 2013; 10:789-794. Aortic Atherosclerosis (ICD10-I70.0). Aortic aneurysm NOS (ICD10-I71.9). 2. Severe T12 compression fracture with near vertebra plana is new since 2018, and appears acute or subacute with associated AVN. No significant retropulsion or other complicating features. 3.  No other acute or inflammatory process identified in the non-contrast abdomen or pelvis. Electronically Signed   By: Genevie Ann M.D.   On: 08/15/2021 04:51   DG Chest Port 1 View  Result Date: 08/15/2021 CLINICAL DATA:  86 year old female with vomiting. EXAM: PORTABLE CHEST 1 VIEW COMPARISON:  Chest radiographs 06/23/2017 and earlier. FINDINGS: Portable AP upright view at 0419 hours. Calcified aortic atherosclerosis. Right Common carotid area vascular stent appears to be new since 2018. Other mediastinal contours are within normal limits. Visualized tracheal air column is within normal limits. Chronic increased reticular opacity in the right upper lobe appears stable since 2018. Otherwise Allowing for portable technique the lungs are clear. No pneumothorax. Negative visible bowel gas. Stable cholecystectomy clips. Partially visible chronic abdominal aortic endograft. IMPRESSION: No acute cardiopulmonary abnormality. Aortic Atherosclerosis (ICD10-I70.0). Right common carotid vascular stent appears to be new since 2018. Chronic abdominal aortic endograft. Electronically Signed   By: Genevie Ann M.D.   On: 08/15/2021 04:31   CT Angio Chest/Abd/Pel for Dissection W and/or W/WO  Result Date:  08/15/2021 CLINICAL DATA:  Abdominal aortic aneurysm, nausea, vomiting EXAM: CT ANGIOGRAPHY CHEST, ABDOMEN AND PELVIS TECHNIQUE: Multidetector CT imaging through the chest, abdomen and pelvis was performed using the standard protocol during bolus administration of intravenous contrast. Multiplanar reconstructed images and MIPs were obtained and reviewed to evaluate the vascular anatomy. CONTRAST:  155mL OMNIPAQUE IOHEXOL 350 MG/ML SOLN COMPARISON:  None. FINDINGS: CTA CHEST FINDINGS Cardiovascular: There is homogeneous enhancement in thoracic aorta. There is ectasia of ascending thoracic aorta measuring 4 cm. There are intraluminal filling defects in the segmental and subsegmental pulmonary artery branches in the posteromedial right lower lobe. No other definite intraluminal filling defects are seen. There are no definite signs of acute right ventricular strain. Mediastinum/Nodes: No significant lymphadenopathy is seen. There is fluid in the lumen of thoracic esophagus suggesting gastroesophageal reflux. Lungs/Pleura: Small patchy infiltrates are seen in the posterior right apex. There are linear densities in left lower lung fields. There is no pleural effusion or pneumothorax. Musculoskeletal: Marked compression fracture is seen in the body of T12 vertebra which was not seen in an earlier examination done on 12/21/2016. Review of the MIP images confirms the above findings. CTA ABDOMEN AND PELVIS FINDINGS VASCULAR Aorta: There is ectasia of proximal abdominal aorta with atherosclerotic plaques and calcifications. There is infrarenal aortic aneurysm measuring 8.1 x 6.3 cm. Infrarenal aortic aneurysm measured 5.9 x 5 cm in the examination of 12/21/2016. There is previous endovascular stent repair in the infrarenal abdominal aortic aneurysm. There are calcifications are seen in the lumen of infrarenal aortic aneurysm. There is linear tubular contrast filled structure in the upper aspect of infrarenal aortic aneurysm  which appears to terminate within the native aneurysm. Celiac: Patent SMA: Patent Renals: Patent IMA: Small caliber IMA is patent. Veins: Unremarkable. Review of the MIP images confirms the above findings. NON-VASCULAR Hepatobiliary: There is fatty infiltration in the liver. Surgical clips are seen in gallbladder fossa. Pancreas: No focal abnormality is seen. Spleen: Unremarkable. Adrenals/Urinary Tract: Adrenals are unremarkable. There is no hydronephrosis. There is cortical thinning. Are no renal or ureteral stones. Stomach/Bowel: Small hiatal hernia is seen. There is fluid in the thoracic esophagus suggesting gastroesophageal reflux. There is no significant small bowel dilation. Appendix is not dilated. There is no significant wall thickening in colon. There is no pericolic stranding. Lymphatic: Unremarkable. Reproductive: Unremarkable. Other: There is no ascites or pneumoperitoneum. Musculoskeletal: Degenerative changes are noted in the pubic symphysis. There is  marked compression of anterior aspect of body of T12 vertebra. There is minimal anterolisthesis at L3-L4 level. Review of the MIP images confirms the above findings. IMPRESSION: There is acute pulmonary embolism in the right lower lung fields with small thrombus burden. There is no evidence of acute right ventricular strain. Small patchy infiltrate is seen in the right upper lobe suggesting pneumonia. Linear densities in the left lower lung fields suggest scarring or subsegmental atelectasis. There is large infrarenal aortic aneurysm measuring up to 8.1 cm in diameter. There is significant interval increase in size of aneurysm in comparison with previous study done on 12/21/2016. There are few scattered calcifications in the native aneurysm. There is a tubular contrast filled structure in the upper aspect of native aneurysm suggesting possible endoleak. There is interval appearance of marked compression fracture in the anterior aspect of body of T12  vertebra. There is no evidence of intestinal obstruction or pneumoperitoneum. There is no hydronephrosis. There is no ascites or pneumoperitoneum. Fatty liver. Gastroesophageal reflux. Imaging finding of acute pulmonary embolism and significant increase in size of infrarenal aortic aneurysm were relayed to Dr. Rowe Pavy by telephone call. Electronically Signed   By: Elmer Picker M.D.   On: 08/15/2021 14:27      Impression / Plan:   Margaret Castillo is a 86 y.o. y/o female with COVID-19 positive, nausea vomiting on presentation with 1 episode of brown/coffee-ground emesis, with acute PE large infrarenal aortic aneurysm with significant increase in size, and transferred to [facility] recommended by vascular surgery  Given that patient has not had any further episodes of brown or coffee-ground emesis and nausea vomiting had resolved would not recommend endoscopy in this high risk patient at this time  In addition, her hemoglobin is 9.1 with her baseline being around 8 in 2019, and was found to be around 8 in August 2022 as well  Coffee-ground emesis is not specific for GI bleed and may be due to other reasons  In addition, conservative management with PPI IV twice daily can treat any underlying esophagitis, small ulcers or gastritis that may have led to the coffee-ground appearance of her emesis as well, without putting her through the risks of a procedure at this time in the setting of other acute medical issues.  Risks of procedure outweigh benefits at this time  PPI IV twice daily  Continue serial CBCs and transfuse PRN Avoid NSAIDs Maintain 2 large-bore IV lines Please page GI with any acute hemodynamic changes, or signs of active GI bleeding  If patient has further episodes of active bleeding, upper endoscopy can be considered as necessary    Thank you for involving me in the care of this patient.      LOS: 1 day   Virgel Manifold, MD  08/16/2021, 2:05 PM

## 2021-08-16 NOTE — Progress Notes (Signed)
PROGRESS NOTE    Margaret Castillo  ZOX:[id number] DOB: May 19, 1935 DOA: 08/15/2021 PCP: [name], MD   Brief Narrative: Taken from H&P.  Margaret Castillo is a 86 y.o. female with a past medical history of hypothyroidism, hyperlipidemia, AAA status post endograft repair, carotid artery disease status post carotid enterectomy, presented to ED with complaints of nausea and vomiting.  Tested positive for COVID-19.  Chest x-ray without any acute abnormality.  CT abdomen and pelvis with acute or subacute T12 compression fracture and progressing infrarenal abdominal aortic aneurysm from 5.9 to 8.3 cm.  Vascular surgery was consulted and they ordered chest and abdominal CTA which shows a right lower lobe PE and progression of her previously treated AAA, there was also some concern of endoleak and involvement of perirenal arteries.  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  Because of the complexity of her disease they recommend transferring to [facility],. Tried and talked with Dr.Ruiz, vascular surgeon over there and after reviewing her films he does not think that she is an acute emergency and they were recommending outpatient follow-up with her on vascular surgeon at [facility] who treated her before,Dr Ealy.  Patient was started on heparin infusion for PE, remained on room air.  Discussed with vascular surgery she will be high risk for anticoagulation, they are advised to discuss with family and if they want to proceed they are recommending low-dose Eliquis.  Discussed with grandson today who will discuss with his mother who is her POA, patient does not have capacity to make any decision due to advance dementia.  Orthopedic surgery was also consulted for T12 compression fracture and they does not think that she is a candidate for kyphoplasty and are recommending conservative management and pain control.  We will continue current management until family comes to a  conclusion. Discontinuing aspirin to decrease her risk. Patient is vaccinated for COVID-19-started on remdesivir Patient is very high risk for deterioration and death-palliative care consult was placed.  Subjective: Patient was seen and examined today.  Very hard of hearing.  Stating that she is hungry and wants to eat.  Denies any pain.  Does not remember when she vomited the last time.  She wants to go home  Assessment & Plan:   Principal Problem:   Gastroenteritis due to COVID-19 virus  Gastroenteritis due to COVID-19 virus.  Seems improving.  Procalcitonin negative, mildly elevated inflammatory markers.  Currently on room air -Continue with remdesivir-day 2 -Continue with steroid -Continue with supportive care and supplements  AAA progression.  Patient is not a candidate for transfer as there is no emergent need.  See above note in summary.  Currently on heparin infusion. Patient will be high risk for rupture and anticoagulation will increase her risk, family to decide whether to continue with anticoagulation or not.  If they want to continue we can start her on low-dose Eliquis. -Continue heparin infusion for now -Discontinue aspirin to decrease the risk -Palliative care consult  Right lower lobe pulmonary embolism.  Low concern of right heart strain on images.  Remained on room air. -Continue with heparin infusion for now-we can switch to low-dose Eliquis if family decided to proceed with anticoagulation.  Elevated troponin.  Most likely secondary to demand ischemia, flat curve and no chest pain.  T12 compression fracture.  No need for kyphoplasty per orthopedic surgery and they are recommending conservative management. -Continue with pain control as needed  Hypothyroidism.  -Continue with  home Synthroid  CAD/hyperlipidemia.  Patient was on aspirin and statin at home. -Continue Lipitor -Holding aspirin as patient is on anticoagulation.  Iron deficiency anemia.  Hemoglobin  stable -Continue home iron supplement  Insomnia. -Continue home Remeron and melatonin   Objective: Vitals:   08/15/21 1605 08/15/21 2134 08/16/21 0454 08/16/21 0838  BP: 111/64 (!) 95/56 (!) 117/48 116/78  Pulse: 76 72 66 87  Resp: Temp: 98.2 F (36.8 C) 97.7 F (36.5 C) (!) 97.4 F (36.3 C) (!) 97.5 F (36.4 C)  TempSrc: Oral Oral Axillary Oral  SpO2: 100% 99% 99% 99%  Weight:      Height:        Intake/Output Summary (Last 24 hours) at 08/16/2021 1523 Last data filed at 08/15/2021 2136 Gross per 24 hour  Intake 53.49 ml  Output --  Net 53.49 ml   Filed Weights   08/15/21 0841 08/15/21 1013  Weight: 61.2 kg 56.9 kg    Examination:  General exam: Very frail and hard of hearing lady, appears calm and comfortable  Respiratory system: Clear to auscultation. Respiratory effort normal. Cardiovascular system: S1 & S2 heard, RRR.  Gastrointestinal system: Soft, nontender, nondistended, bowel sounds positive. Central nervous system: Alert and oriented to self. No focal neurological deficits. Extremities: No edema, no cyanosis, pulses intact and symmetrical. Psychiatry: Judgement and insight appear impaired.   DVT prophylaxis: Heparin infusion Code Status: Full Family Communication: Discussed with grandson, he will discuss with his mother who is her POA Disposition Plan:  Status is: Inpatient  Remains inpatient appropriate because: Severity of illness  Level of care: Med-Surg  All the records are reviewed and case discussed with Care Management/Social Worker. Management plans discussed with the patient, nursing and they are in agreement.  Consultants:  Orthopedic surgery Vascular surgery  Procedures:  Antimicrobials:   Data Reviewed: I have personally reviewed following labs and imaging studies  CBC: Recent Labs  Lab 08/15/21 0359 08/15/21 1605 08/15/21 2219 08/16/21 0304  WBC 5.2  --   --  4.4  NEUTROABS 3.8  --   --   --   HGB 10.9*  9.6* 10.5* 9.1*  HCT 32.0* 28.0* 30.3* 26.6*  MCV 102.6*  --   --  104.3*  PLT 397  --   --  280   Basic Metabolic Panel: Recent Labs  Lab 08/15/21 0359  NA 136  K 3.6  CL 104  CO2 24  GLUCOSE 142*  BUN 12  CREATININE 0.63  CALCIUM 8.2*   GFR: Estimated Creatinine Clearance: 47 mL/min (by C-G formula based on SCr of 0.63 mg/dL). Liver Function Tests: Recent Labs  Lab 08/15/21 0359  AST 43*  ALT 14  ALKPHOS 97  BILITOT 1.3*  PROT 6.3*  ALBUMIN 2.7*   Recent Labs  Lab 08/15/21 0359  LIPASE 30   No results for input(s): AMMONIA in the last 168 hours. Coagulation Profile: Recent Labs  Lab 08/15/21 1802  INR 1.3*   Cardiac Enzymes: No results for input(s): CKTOTAL, CKMB, CKMBINDEX, TROPONINI in the last 168 hours. BNP (last 3 results) No results for input(s): PROBNP in the last 8760 hours. HbA1C: No results for input(s): HGBA1C in the last 72 hours. CBG: No results for input(s): GLUCAP in the last 168 hours. Lipid Profile: No results for input(s): CHOL, HDL, LDLCALC, TRIG, CHOLHDL, LDLDIRECT in the last 72 hours. Thyroid Function Tests: No results for input(s): TSH, T4TOTAL, FREET4, T3FREE, THYROIDAB in the last 72 hours. Anemia Panel:  Recent Labs    08/15/21 0717 08/15/21 1605  FERRITIN 840*  --   TIBC  --  133*  IRON  --  67   Sepsis Labs: Recent Labs  Lab 08/15/21 0359 08/15/21 0717  PROCALCITON  --  <0.10  LATICACIDVEN 1.7 1.7    Recent Results (from the past 240 hour(s))  Resp Panel by RT-PCR (Flu A&B, Covid) Nasopharyngeal Swab     Status: Abnormal   Collection Time: 08/15/21  4:00 AM   Specimen: Nasopharyngeal Swab; Nasopharyngeal(NP) swabs in vial transport medium  Result Value Ref Range Status   SARS Coronavirus 2 by RT PCR POSITIVE (A) NEGATIVE Final    Comment: (NOTE) SARS-CoV-2 target nucleic acids are DETECTED.  The SARS-CoV-2 RNA is generally detectable in upper respiratory specimens during the acute phase of infection.  Positive results are indicative of the presence of the identified virus, but do not rule out bacterial infection or co-infection with other pathogens not detected by the test. Clinical correlation with patient history and other diagnostic information is necessary to determine patient infection status. The expected result is Negative.  Fact Sheet for Patients: BloggerCourse.com  Fact Sheet for Healthcare Providers: SeriousBroker.it  This test is not yet approved or cleared by the [place] FDA and  has been authorized for detection and/or diagnosis of SARS-CoV-2 by FDA under an Emergency Use Authorization (EUA).  This EUA will remain in effect (meaning this test can be used) for the duration of  the COVID-19 declaration under Section 564(b)(1) of the A ct, 21 U.S.C. section 360bbb-3(b)(1), unless the authorization is terminated or revoked sooner.     Influenza A by PCR NEGATIVE NEGATIVE Final   Influenza B by PCR NEGATIVE NEGATIVE Final    Comment: (NOTE) The Xpert Xpress SARS-CoV-2/FLU/RSV plus assay is intended as an aid in the diagnosis of influenza from Nasopharyngeal swab specimens and should not be used as a sole basis for treatment. Nasal washings and aspirates are unacceptable for Xpert Xpress SARS-CoV-2/FLU/RSV testing.  Fact Sheet for Patients: BloggerCourse.com  Fact Sheet for Healthcare Providers: SeriousBroker.it  This test is not yet approved or cleared by the [place] FDA and has been authorized for detection and/or diagnosis of SARS-CoV-2 by FDA under an Emergency Use Authorization (EUA). This EUA will remain in effect (meaning this test can be used) for the duration of the COVID-19 declaration under Section 564(b)(1) of the Act, 21 U.S.C. section 360bbb-3(b)(1), unless the authorization is terminated or revoked.  Performed at [facility],  [address] [place], [place] [address]      Radiology Studies: CT Abdomen Pelvis Wo Contrast  Result Date: 08/15/2021 CLINICAL DATA:  86 year old female with vomiting and rectal pain. Abdominal aortic aneurysm treated with endograft. EXAM: CT ABDOMEN AND PELVIS WITHOUT CONTRAST TECHNIQUE: Multidetector CT imaging of the abdomen and pelvis was performed following the standard protocol without IV contrast. COMPARISON:  CTA Abdomen and Pelvis 12/21/2016. FINDINGS: Lower chest: Extensive calcified coronary artery atherosclerosis. Partially visible tortuous thoracic aorta with calcified plaque. No pericardial effusion. Stable borderline cardiomegaly. Stable mild lung base scarring. No pleural effusion. Hepatobiliary: Chronically absent gallbladder. Negative noncontrast liver. Pancreas: Atrophy. Spleen: Negative. Adrenals/Urinary Tract: Negative adrenal glands. Nonobstructed kidneys. Nephrolithiasis versus renal vascular calcification. Diminutive ureters and bladder. Incidental pelvic phleboliths. Stomach/Bowel: Redundant large bowel with intermittent retained stool increased compared to 2018. No large bowel inflammation identified. Normal appendix on coronal image 37. Decompressed terminal ileum. No dilated small bowel. Decompressed stomach and duodenum. Small volume of simple  density fluid in the stomach and the duodenum. No free air. No free fluid. Vascular/Lymphatic: Extensive Aortoiliac calcified atherosclerosis. Chronic bifurcated abdominal aortic endograft. However, the underlying infrarenal abdominal aortic aneurysm has substantially progressed since 2018, now up to 83 mm maximum transverse diameter (series 2, image 48) versus 59 mm in 2018. Aneurysm length of 90 mm has also increased from 82 mm in 2018. But there is no Peri aortic hematoma or inflammatory stranding identified. Vascular patency is not evaluated in the absence of IV contrast. No lymphadenopathy. Reproductive: Negative. Other: No  pelvic free fluid. Musculoskeletal: Severe T12 compression fracture with near vertebra plana (series 6, image 66) is new since 2018, and appears unhealed with associated AVN, vacuum phenomena. Minimal retropulsion of bone with no associated spinal stenosis. Osteopenia. Other visualized osseous structures appears stable. IMPRESSION: 1. Substantially progressed infrarenal abdominal aortic aneurysm since 2018 despite endograft. Native aneurysm sac now up to 8.3 cm maximum transverse diameter versus 5.9 cm in 2018. No para aortic hematoma or inflammatory stranding. Recommend Vascular Surgery consultation. This recommendation follows ACR consensus guidelines: White Paper of the ACR Incidental Findings Committee II on Vascular Findings. J Am Coll Radiol 2013; 10:789-794. Aortic Atherosclerosis (ICD10-I70.0). Aortic aneurysm NOS (ICD10-I71.9). 2. Severe T12 compression fracture with near vertebra plana is new since 2018, and appears acute or subacute with associated AVN. No significant retropulsion or other complicating features. 3. No other acute or inflammatory process identified in the non-contrast abdomen or pelvis. Electronically Signed   By: Odessa Fleming M.D.   On: 08/15/2021 04:51   DG Chest Port 1 View  Result Date: 08/15/2021 CLINICAL DATA:  86 year old female with vomiting. EXAM: PORTABLE CHEST 1 VIEW COMPARISON:  Chest radiographs 06/23/2017 and earlier. FINDINGS: Portable AP upright view at 0419 hours. Calcified aortic atherosclerosis. Right Common carotid area vascular stent appears to be new since 2018. Other mediastinal contours are within normal limits. Visualized tracheal air column is within normal limits. Chronic increased reticular opacity in the right upper lobe appears stable since 2018. Otherwise Allowing for portable technique the lungs are clear. No pneumothorax. Negative visible bowel gas. Stable cholecystectomy clips. Partially visible chronic abdominal aortic endograft. IMPRESSION: No acute  cardiopulmonary abnormality. Aortic Atherosclerosis (ICD10-I70.0). Right common carotid vascular stent appears to be new since 2018. Chronic abdominal aortic endograft. Electronically Signed   By: Odessa Fleming M.D.   On: 08/15/2021 04:31   CT Angio Chest/Abd/Pel for Dissection W and/or W/WO  Result Date: 08/15/2021 CLINICAL DATA:  Abdominal aortic aneurysm, nausea, vomiting EXAM: CT ANGIOGRAPHY CHEST, ABDOMEN AND PELVIS TECHNIQUE: Multidetector CT imaging through the chest, abdomen and pelvis was performed using the standard protocol during bolus administration of intravenous contrast. Multiplanar reconstructed images and MIPs were obtained and reviewed to evaluate the vascular anatomy. CONTRAST:  OMNIPAQUE IOHEXOL 350 MG/ML SOLN COMPARISON:  None. FINDINGS: CTA CHEST FINDINGS Cardiovascular: There is homogeneous enhancement in thoracic aorta. There is ectasia of ascending thoracic aorta measuring 4 cm. There are intraluminal filling defects in the segmental and subsegmental pulmonary artery branches in the posteromedial right lower lobe. No other definite intraluminal filling defects are seen. There are no definite signs of acute right ventricular strain. Mediastinum/Nodes: No significant lymphadenopathy is seen. There is fluid in the lumen of thoracic esophagus suggesting gastroesophageal reflux. Lungs/Pleura: Small patchy infiltrates are seen in the posterior right apex. There are linear densities in left lower lung fields. There is no pleural effusion or pneumothorax. Musculoskeletal: Marked compression fracture is seen in the body of T12 vertebra  which was not seen in an earlier examination done on 12/21/2016. Review of the MIP images confirms the above findings. CTA ABDOMEN AND PELVIS FINDINGS VASCULAR Aorta: There is ectasia of proximal abdominal aorta with atherosclerotic plaques and calcifications. There is infrarenal aortic aneurysm measuring 8.1 x 6.3 cm. Infrarenal aortic aneurysm measured 5.9 x 5  cm in the examination of 12/21/2016. There is previous endovascular stent repair in the infrarenal abdominal aortic aneurysm. There are calcifications are seen in the lumen of infrarenal aortic aneurysm. There is linear tubular contrast filled structure in the upper aspect of infrarenal aortic aneurysm which appears to terminate within the native aneurysm. Celiac: Patent SMA: Patent Renals: Patent IMA: Small caliber IMA is patent. Veins: Unremarkable. Review of the MIP images confirms the above findings. NON-VASCULAR Hepatobiliary: There is fatty infiltration in the liver. Surgical clips are seen in gallbladder fossa. Pancreas: No focal abnormality is seen. Spleen: Unremarkable. Adrenals/Urinary Tract: Adrenals are unremarkable. There is no hydronephrosis. There is cortical thinning. Are no renal or ureteral stones. Stomach/Bowel: Small hiatal hernia is seen. There is fluid in the thoracic esophagus suggesting gastroesophageal reflux. There is no significant small bowel dilation. Appendix is not dilated. There is no significant wall thickening in colon. There is no pericolic stranding. Lymphatic: Unremarkable. Reproductive: Unremarkable. Other: There is no ascites or pneumoperitoneum. Musculoskeletal: Degenerative changes are noted in the pubic symphysis. There is marked compression of anterior aspect of body of T12 vertebra. There is minimal anterolisthesis at L3-L4 level. Review of the MIP images confirms the above findings. IMPRESSION: There is acute pulmonary embolism in the right lower lung fields with small thrombus burden. There is no evidence of acute right ventricular strain. Small patchy infiltrate is seen in the right upper lobe suggesting pneumonia. Linear densities in the left lower lung fields suggest scarring or subsegmental atelectasis. There is large infrarenal aortic aneurysm measuring up to 8.1 cm in diameter. There is significant interval increase in size of aneurysm in comparison with previous  study done on 12/21/2016. There are few scattered calcifications in the native aneurysm. There is a tubular contrast filled structure in the upper aspect of native aneurysm suggesting possible endoleak. There is interval appearance of marked compression fracture in the anterior aspect of body of T12 vertebra. There is no evidence of intestinal obstruction or pneumoperitoneum. There is no hydronephrosis. There is no ascites or pneumoperitoneum. Fatty liver. Gastroesophageal reflux. Imaging finding of acute pulmonary embolism and significant increase in size of infrarenal aortic aneurysm were relayed to Dr. Linna Darner by telephone call. Electronically Signed   By: Ernie Avena M.D.   On: 08/15/2021 14:27    Scheduled Meds:  aspirin  81 mg Oral QODAY   atorvastatin  10 mg Oral QHS   cholecalciferol  1,000 Units Oral Daily   conjugated estrogens  0.5 g Vaginal Once per day on Mon Thu   ferrous sulfate  324 mg Oral QODAY   levothyroxine  75 mcg Oral Daily   methylPREDNISolone (SOLU-MEDROL) injection  1 mg/kg Intravenous Q12H   Followed by   Melene Muller ON 08/18/2021] predniSONE  50 mg Oral Daily   mirtazapine  15 mg Oral QHS   multivitamin with minerals  1 tablet Oral Daily   pantoprazole (PROTONIX) IV  40 mg Intravenous Q12H   sodium chloride flush  10-40 mL Intracatheter Q12H   cyanocobalamin  1,000 mcg Oral Daily   Continuous Infusions:  heparin 800 Units/hr (08/16/21 0557)   lactated ringers Stopped (08/15/21 0846)   promethazine (PHENERGAN) injection (  IM or IVPB) 6.25 mg (08/15/21 1206)   remdesivir 100 mg in NS 100 mL 100 mg (08/16/21 0936)     LOS: 1 day   Time spent: 45 minutes. More than 50% of the time was spent in counseling/coordination of care  Arnetha Courser, MD [facility]  If 7PM-7AM, please contact night-coverage [email or web address]  08/16/2021, 3:23 PM   This record has been created using Conservation officer, historic buildings. Errors have been sought and corrected,but may  not always be located. Such creation errors do not reflect on the standard of care.

## 2021-08-16 NOTE — Progress Notes (Signed)
Patient denies abdominal pain this morning.  Again CTA reveals 8.3cm AAA sac. The EVAR is patent, no evidence of rupture or stranding. There is evidence of Type 2 lumbar endoleak, proximal fixation appears to be intact however difficult to determine with certainty with current imaging.  In addition- patient has stenosis of  the Celiac Artery and heavily calcified SMA- likely contributing to her current gastritis  Recommend transfer to [facility] for advanced endovascular intervention as this is the only option operative for this patient.  Extensive discussion with grandson Margaret Castillo via phone, all questions answered. Understanding expressed. He wishes to have her transferred to [facility] when a bed is available.

## 2021-08-16 NOTE — Progress Notes (Signed)
ANTICOAGULATION CONSULT NOTE  Pharmacy Consult for heparin infusion Indication: pulmonary embolus  Allergies  Allergen Reactions   Tape Other (See Comments)    Skin irritation   Atorvastatin Other (See Comments)    MUSCLE PAIN (all statins)   Codeine Nausea Only   Crestor [Rosuvastatin] Other (See Comments)    Leg pain/ joint pain  ( ALL THE STATIN's )   Penicillins Rash    Has patient had a PCN reaction causing immediate rash, facial/tongue/throat swelling, SOB or lightheadedness with hypotension: Yes Has patient had a PCN reaction causing severe rash involving mucus membranes or skin necrosis: No Has patient had a PCN reaction that required hospitalization No Has patient had a PCN reaction occurring within the last 10 years: No If all of the above answers are "NO", then may proceed with Cephalosporin use.    Cephalexin Rash    Patient Measurements: Height: 5\' 5"  (165.1 cm) Weight: 56.9 kg (125 lb 7.1 oz) IBW/kg (Calculated) : 57 kg   Vital Signs: Temp: 97.7 F (36.5 C) (12/25 2134) Temp Source: Oral (12/25 2134) BP: 95/56 (12/25 2134) Pulse Rate: 72 (12/25 2134)  Labs: Recent Labs    08/15/21 0359 08/15/21 0559 08/15/21 1605 08/15/21 1802 08/15/21 2219 08/16/21 0304  HGB 10.9*  --  9.6*  --  10.5* 9.1*  HCT 32.0*  --  28.0*  --  30.3* 26.6*  PLT 397  --   --   --   --  280  APTT  --   --   --  33  --   --   LABPROT  --   --   --  16.6*  --   --   INR  --   --   --  1.3*  --   --   HEPARINUNFRC  --   --   --   --   --  1.01*  CREATININE 0.63  --   --   --   --   --   TROPONINIHS 25* 24*  --   --   --   --      Estimated Creatinine Clearance: 47 mL/min (by C-G formula based on SCr of 0.63 mg/dL).   Medical History: Past Medical History:  Diagnosis Date   AAA (abdominal aortic aneurysm)    Anemia    Arthritis    Neck   Bladder prolapse, female, acquired    Carotid artery occlusion    Endoleak post endovascular aneurysm repair [facility]), Type I 02/2016    [facility] for aortic cuff placement   Hearing aid worn    Hypothyroidism    Myocardial infarction [facility]) 1982   Polymyalgia rheumatica (HCC)    Prolapsed internal hemorrhoids, grade 3    Shingles    Uterine prolapse     Medications:  Per chart review, no anticoagulation noted prior to admission  Assessment: 86 y.o. female with a past medical history of hypothyroidism, hyperlipidemia, AAA status post endograft repair, carotid artery disease status post carotid enterectomy. Pharmacy has been consulted for heparin.   Goal of Therapy:  Heparin level 0.3-0.7 units/ml Monitor platelets by anticoagulation protocol: Yes   Plan:  12/26: HL @ 0430 = 1.01, SUPRAtherapeutic  Will hold heparin drip for 1 hr and restart drip @ 800 units/hr.  Will recheck HL 8 hrs after restart.   Marchell Froman D 08/16/2021,4:30 AM

## 2021-08-17 DIAGNOSIS — A0839 Other viral enteritis: Secondary | ICD-10-CM | POA: Diagnosis not present

## 2021-08-17 DIAGNOSIS — I714 Abdominal aortic aneurysm, without rupture, unspecified: Secondary | ICD-10-CM

## 2021-08-17 DIAGNOSIS — U071 COVID-19: Secondary | ICD-10-CM | POA: Diagnosis not present

## 2021-08-17 LAB — CBC
HCT: 22.2 % — ABNORMAL LOW (ref 36.0–46.0)
Hemoglobin: 7.7 g/dL — ABNORMAL LOW (ref 12.0–15.0)
MCH: 35.8 pg — ABNORMAL HIGH (ref 26.0–34.0)
MCHC: 34.7 g/dL (ref 30.0–36.0)
MCV: 103.3 fL — ABNORMAL HIGH (ref 80.0–100.0)
Platelets: 268 10*3/uL (ref 150–400)
RBC: 2.15 MIL/uL — ABNORMAL LOW (ref 3.87–5.11)
RDW: 18.8 % — ABNORMAL HIGH (ref 11.5–15.5)
WBC: 4.4 10*3/uL (ref 4.0–10.5)
nRBC: 0.5 % — ABNORMAL HIGH (ref 0.0–0.2)

## 2021-08-17 LAB — HEPARIN LEVEL (UNFRACTIONATED)
Heparin Unfractionated: 0.6 IU/mL (ref 0.30–0.70)
Heparin Unfractionated: 0.37 IU/mL (ref 0.30–0.70)

## 2021-08-17 MED ORDER — LACTATED RINGERS IV BOLUS
500.0000 mL | Freq: Once | INTRAVENOUS | Status: AC
  Administered 2021-08-17: 07:00:00 500 mL via INTRAVENOUS

## 2021-08-17 MED ORDER — LACTATED RINGERS IV BOLUS
500.0000 mL | Freq: Once | INTRAVENOUS | Status: AC
  Administered 2021-08-17: 06:00:00 500 mL via INTRAVENOUS

## 2021-08-17 NOTE — Progress Notes (Signed)
PROGRESS NOTE    Margaret Castillo  HMC:[id number] DOB: 23-May-1935 DOA: 08/15/2021 PCP: [name], MD   Brief Narrative: Taken from H&P.  Margaret Castillo is a 86 y.o. female with a past medical history of hypothyroidism, hyperlipidemia, AAA status post endograft repair, carotid artery disease status post carotid enterectomy, presented to ED with complaints of nausea and vomiting.  Tested positive for COVID-19.  Chest x-ray without any acute abnormality.  CT abdomen and pelvis with acute or subacute T12 compression fracture and progressing infrarenal abdominal aortic aneurysm from 5.9 to 8.3 cm.  Vascular surgery was consulted and they ordered chest and abdominal CTA which shows a right lower lobe PE and progression of her previously treated AAA, there was also some concern of endoleak and involvement of perirenal arteries.  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  Because of the complexity of her disease they recommend transferring to [facility] Tried and talked with Dr.Ruiz, vascular surgeon over there and after reviewing her films he does not think that she is an acute emergency and they were recommending outpatient follow-up with her on vascular surgeon at [facility] who treated her before,Dr Ealy.  Patient was started on heparin infusion for PE, remained on room air.  Discussed with vascular surgery she will be high risk for anticoagulation, they are advised to discuss with family and if they want to proceed they are recommending low-dose Eliquis.  Discussed with grandson today who will discuss with his mother who is her POA, patient does not have capacity to make any decision due to advance dementia.  Orthopedic surgery was also consulted for T12 compression fracture and they does not think that she is a candidate for kyphoplasty and are recommending conservative management and pain control.  We will continue current management until family comes to a  conclusion. Discontinuing aspirin to decrease her risk. Patient is vaccinated for COVID-19-started on remdesivir Patient is very high risk for deterioration and death-palliative care consult was placed.  12/27: Received message by vascular surgery, Dr. Wyn Quaker that he might be able to help her with embolization and coiling of her significantly enlarged AAA.  He is recommending continuation of heparin and he will talk with the family.  Subjective: Patient was seen and examined today.  No new complaints.  Grandson at bedside.  According to grandson they do not want her to take anticoagulation due to increased risk of bleeding. He agrees to talk with palliative care.  Assessment & Plan:   Principal Problem:   Gastroenteritis due to COVID-19 virus  Gastroenteritis due to COVID-19 virus.  Seems improving.  Procalcitonin negative, mildly elevated inflammatory markers.  Currently on room air -Continue with remdesivir-day 3 -Continue with steroid -Continue with supportive care and supplements  AAA progression.  Patient is not a candidate for transfer as there is no emergent need.  See above note in summary.  Currently on heparin infusion. Vascular surgeon Dr. Wyn Quaker messaged that he might be able to help her with embolization and coiling, he was recommending continuation of heparin for now and will talk with family. Per Dr. Wyn Quaker she can be placed on anticoagulation. -Continue heparin infusion for now -Discontinue aspirin to decrease the risk -Palliative care consult  Right lower lobe pulmonary embolism.  Low concern of right heart strain on images.  Remained on room air. -Continue with heparin infusion for now-we can switch to low-dose Eliquis if family decided to proceed with anticoagulation.  Elevated troponin.  Most likely secondary to demand ischemia, flat curve and no chest pain.  T12 compression fracture.  No need for kyphoplasty per orthopedic surgery and they are recommending conservative  management. -Continue with pain control as needed  Hypothyroidism.  -Continue with home Synthroid  CAD/hyperlipidemia.  Patient was on aspirin and statin at home. -Continue Lipitor -Holding aspirin as patient is on anticoagulation.  Iron deficiency anemia.  Hemoglobin stable -Continue home iron supplement  Insomnia. -Continue home Remeron and melatonin   Objective: Vitals:   08/17/21 0805 08/17/21 0900 08/17/21 1100 08/17/21 1528  BP: (!) 91/36 (!) 148/122 (!) 108/48 (!) 108/46  Pulse: 61 62 70 68  Resp: 18 18 18 18   Temp: 97.6 F (36.4 C) 97.8 F (36.6 C) (!) 97.5 F (36.4 C) 97.9 F (36.6 C)  TempSrc: Oral Oral Oral   SpO2: 99% 99% 99% 100%  Weight:      Height:        Intake/Output Summary (Last 24 hours) at 08/17/2021 1602 Last data filed at 08/17/2021 1416 Gross per 24 hour  Intake 955.67 ml  Output --  Net 955.67 ml    Filed Weights   08/15/21 0841 08/15/21 1013  Weight: 61.2 kg 56.9 kg    Examination:  General.  Frail and malnourished elderly lady, in no acute distress. Pulmonary.  Lungs clear bilaterally, normal respiratory effort. CV.  Regular rate and rhythm, no JVD, rub or murmur. Abdomen.  Soft, nontender, nondistended, BS positive. CNS.  Alert and oriented x3.  No focal neurologic deficit. Extremities.  No edema, no cyanosis, pulses intact and symmetrical. Psychiatry.  Judgment and insight appears impaired.   DVT prophylaxis: Heparin infusion Code Status: Full Family Communication: Discussed with grandson at bedside Disposition Plan:  Status is: Inpatient  Remains inpatient appropriate because: Severity of illness  Level of care: Med-Surg  All the records are reviewed and case discussed with Care Management/Social Worker. Management plans discussed with the patient, nursing and they are in agreement.  Consultants:  Orthopedic surgery Vascular surgery  Procedures:  Antimicrobials:   Data Reviewed: I have personally reviewed  following labs and imaging studies  CBC: Recent Labs  Lab 08/15/21 0359 08/15/21 1605 08/15/21 2219 08/16/21 0304 08/17/21 0909  WBC 5.2  --   --  4.4 4.4  NEUTROABS 3.8  --   --   --   --   HGB 10.9* 9.6* 10.5* 9.1* 7.7*  HCT 32.0* 28.0* 30.3* 26.6* 22.2*  MCV 102.6*  --   --  104.3* 103.3*  PLT 397  --   --  280 268    Basic Metabolic Panel: Recent Labs  Lab 08/15/21 0359  NA 136  K 3.6  CL 104  CO2 24  GLUCOSE 142*  BUN 12  CREATININE 0.63  CALCIUM 8.2*    GFR: Estimated Creatinine Clearance: 47 mL/min (by C-G formula based on SCr of 0.63 mg/dL). Liver Function Tests: Recent Labs  Lab 08/15/21 0359  AST 43*  ALT 14  ALKPHOS 97  BILITOT 1.3*  PROT 6.3*  ALBUMIN 2.7*    Recent Labs  Lab 08/15/21 0359  LIPASE 30    No results for input(s): AMMONIA in the last 168 hours. Coagulation Profile: Recent Labs  Lab 08/15/21 1802  INR 1.3*    Cardiac Enzymes: No results for input(s): CKTOTAL, CKMB, CKMBINDEX, TROPONINI in the last 168 hours. BNP (last 3 results) No results for input(s): PROBNP in the last 8760 hours. HbA1C: No results for input(s): HGBA1C in the  last 72 hours. CBG: No results for input(s): GLUCAP in the last 168 hours. Lipid Profile: No results for input(s): CHOL, HDL, LDLCALC, TRIG, CHOLHDL, LDLDIRECT in the last 72 hours. Thyroid Function Tests: No results for input(s): TSH, T4TOTAL, FREET4, T3FREE, THYROIDAB in the last 72 hours. Anemia Panel: Recent Labs    08/15/21 0717 08/15/21 1605  FERRITIN 840*  --   TIBC  --  133*  IRON  --  67    Sepsis Labs: Recent Labs  Lab 08/15/21 0359 08/15/21 0717  PROCALCITON  --  <0.10  LATICACIDVEN 1.7 1.7     Recent Results (from the past 240 hour(s))  Resp Panel by RT-PCR (Flu A&B, Covid) Nasopharyngeal Swab     Status: Abnormal   Collection Time: 08/15/21  4:00 AM   Specimen: Nasopharyngeal Swab; Nasopharyngeal(NP) swabs in vial transport medium  Result Value Ref Range  Status   SARS Coronavirus 2 by RT PCR POSITIVE (A) NEGATIVE Final    Comment: (NOTE) SARS-CoV-2 target nucleic acids are DETECTED.  The SARS-CoV-2 RNA is generally detectable in upper respiratory specimens during the acute phase of infection. Positive results are indicative of the presence of the identified virus, but do not rule out bacterial infection or co-infection with other pathogens not detected by the test. Clinical correlation with patient history and other diagnostic information is necessary to determine patient infection status. The expected result is Negative.  Fact Sheet for Patients: BloggerCourse.com  Fact Sheet for Healthcare Providers: SeriousBroker.it  This test is not yet approved or cleared by the [place] FDA and  has been authorized for detection and/or diagnosis of SARS-CoV-2 by FDA under an Emergency Use Authorization (EUA).  This EUA will remain in effect (meaning this test can be used) for the duration of  the COVID-19 declaration under Section 564(b)(1) of the A ct, 21 U.S.C. section 360bbb-3(b)(1), unless the authorization is terminated or revoked sooner.     Influenza A by PCR NEGATIVE NEGATIVE Final   Influenza B by PCR NEGATIVE NEGATIVE Final    Comment: (NOTE) The Xpert Xpress SARS-CoV-2/FLU/RSV plus assay is intended as an aid in the diagnosis of influenza from Nasopharyngeal swab specimens and should not be used as a sole basis for treatment. Nasal washings and aspirates are unacceptable for Xpert Xpress SARS-CoV-2/FLU/RSV testing.  Fact Sheet for Patients: BloggerCourse.com  Fact Sheet for Healthcare Providers: SeriousBroker.it  This test is not yet approved or cleared by the [place] FDA and has been authorized for detection and/or diagnosis of SARS-CoV-2 by FDA under an Emergency Use Authorization (EUA). This EUA will remain in  effect (meaning this test can be used) for the duration of the COVID-19 declaration under Section 564(b)(1) of the Act, 21 U.S.C. section 360bbb-3(b)(1), unless the authorization is terminated or revoked.  Performed at [facility], [address], [place], [place] [address]       Radiology Studies: No results found.  Scheduled Meds:  atorvastatin  10 mg Oral QHS   cholecalciferol  1,000 Units Oral Daily   conjugated estrogens  0.5 g Vaginal Once per day on Mon Thu   ferrous sulfate  324 mg Oral QODAY   levothyroxine  75 mcg Oral Daily   methylPREDNISolone (SOLU-MEDROL) injection  1 mg/kg Intravenous Q12H   Followed by   Melene Muller ON 08/18/2021] predniSONE  50 mg Oral Daily   mirtazapine  15 mg Oral QHS   multivitamin with minerals  1 tablet Oral Daily   pantoprazole (PROTONIX) IV  40  mg Intravenous Q12H   sodium chloride flush  10-40 mL Intracatheter Q12H   cyanocobalamin  1,000 mcg Oral Daily   Continuous Infusions:  heparin 550 Units/hr (08/17/21 1416)   lactated ringers Stopped (08/15/21 0846)   promethazine (PHENERGAN) injection (IM or IVPB) Stopped (08/15/21 1222)   remdesivir 100 mg in NS 100 mL 100 mg (08/17/21 0938)     LOS: 2 days   Time spent: 40 minutes. More than 50% of the time was spent in counseling/coordination of care  Arnetha Courser, MD [facility]  If 7PM-7AM, please contact night-coverage [email or web address]  08/17/2021, 4:02 PM   This record has been created using Conservation officer, historic buildings. Errors have been sought and corrected,but may not always be located. Such creation errors do not reflect on the standard of care.

## 2021-08-17 NOTE — H&P (View-Only) (Signed)
[facility]  Daily Progress Note   Subjective  -   Patient denies any current abdominal pain.  Says she ate okay today.  The history is difficult to obtain as she is very hard of hearing and cannot understand me completely.  I have also discussed the situation with her grandson.  Not having any respiratory symptoms currently.  GI symptoms seem to be improving.  No major issues overnight.  Objective Vitals:   08/17/21 0805 08/17/21 0900 08/17/21 1100 08/17/21 1528  BP: (!) 91/36 (!) 148/122 (!) 108/48 (!) 108/46  Pulse: 61 62 70 68  Resp: 18 18 18 18   Temp: 97.6 F (36.4 C) 97.8 F (36.6 C) (!) 97.5 F (36.4 C) 97.9 F (36.6 C)  TempSrc: Oral Oral Oral   SpO2: 99% 99% 99% 100%  Weight:      Height:        Intake/Output Summary (Last 24 hours) at 08/17/2021 1605 Last data filed at 08/17/2021 1416 Gross per 24 hour  Intake 955.67 ml  Output --  Net 955.67 ml    PULM  CTAB CV  RRR VASC  increased aortic impulse.  Not overly tender overlying her aorta.  Laboratory CBC    Component Value Date/Time   WBC 4.4 08/17/2021 0909   HGB 7.7 (L) 08/17/2021 0909   HCT 22.2 (L) 08/17/2021 0909   PLT 268 08/17/2021 0909    BMET    Component Value Date/Time   NA 136 08/15/2021 0359   K 3.6 08/15/2021 0359   CL 104 08/15/2021 0359   CO2 24 08/15/2021 0359   GLUCOSE 142 (H) 08/15/2021 0359   BUN 12 08/15/2021 0359   CREATININE 0.63 08/15/2021 0359   CALCIUM 8.2 (L) 08/15/2021 0359   GFRNONAA >60 08/15/2021 0359   GFRAA >60 03/23/2018 0319    Assessment/Planning:   Enlarging abdominal aortic aneurysm status post previous stent graft repair I have independently reviewed her CT angiogram and this pretty clearly appears to be a large type II endoleak.  Although she is aneurysmal in the supraceliac aorta, she appears to have a good seal proximally and distally.  He can also see the large lumbar arteries feeding the aneurysm sac.  I think this can be  addressed with coil embolization of the feeding branches into the aneurysm sac would be appropriate and should be done soon due to the very large size of the aneurysm sac.  I have put her on the schedule for tomorrow for angiogram and possible embolization.  Risks and benefits were discussed with the patient as well as the grandson. Celiac and SMA disease.  Appears that both vessels are fairly diseased.  She is currently not having any significant abdominal pain or worry for acute mesenteric ischemia.  May have a component of chronic visceral ischemia.  We can address this over time and consider treatment as an outpatient for this with celiac or SMA intervention in the future as needed. COVID infection.  Has had COVID for over 2 weeks now and continues to test positive without symptoms.  Not sure she would needs to remain in isolation and we will not delay procedures for this. Gastroenteritis likely due to COVID-19 virus.  Continue supportive care.  Appears to be improving. Coronary artery disease.  On aspirin and a statin agent at home. Pulmonary embolus.  I am okay with anticoagulation for pulmonary embolus.  Given her advanced age and other comorbidities, a lower dose of Eliquis is certainly reasonable as  well. Overall, the patient is somewhat complex with multiple ongoing issues and several different vascular issues as described above.  Her enlarging AAA is the most worrisome and primary issue and we will address this tomorrow.   Festus Barren  08/17/2021, 4:05 PM

## 2021-08-17 NOTE — Progress Notes (Signed)
[facility] for heparin infusion Indication: pulmonary embolus  Allergies  Allergen Reactions   Tape Other (See Comments)    Skin irritation   Atorvastatin Other (See Comments)    MUSCLE PAIN (all statins)   Codeine Nausea Only   Crestor [Rosuvastatin] Other (See Comments)    Leg pain/ joint pain  ( ALL THE STATIN's )   Penicillins Rash    Has patient had a PCN reaction causing immediate rash, facial/tongue/throat swelling, SOB or lightheadedness with hypotension: Yes Has patient had a PCN reaction causing severe rash involving mucus membranes or skin necrosis: No Has patient had a PCN reaction that required hospitalization No Has patient had a PCN reaction occurring within the last 10 years: No If all of the above answers are "NO", then may proceed with Cephalosporin use.    Cephalexin Rash    Patient Measurements: Height: 5\' 5"  (165.1 cm) Weight: 56.9 kg (125 lb 7.1 oz) IBW/kg (Calculated) : 57 kg   Vital Signs: Temp: 97.9 F (36.6 C) (12/27 1528) Temp Source: Oral (12/27 1100) BP: 108/46 (12/27 1528) Pulse Rate: 68 (12/27 1528)  Labs: Recent Labs    08/15/21 0359 08/15/21 0559 08/15/21 1605 08/15/21 1802 08/15/21 2219 08/16/21 0304 08/16/21 1444 08/16/21 2323 08/17/21 0909 08/17/21 1711  HGB 10.9*  --    < >  --  10.5* 9.1*  --   --  7.7*  --   HCT 32.0*  --    < >  --  30.3* 26.6*  --   --  22.2*  --   PLT 397  --   --   --   --  280  --   --  268  --   APTT  --   --   --  33  --   --   --   --   --   --   LABPROT  --   --   --  16.6*  --   --   --   --   --   --   INR  --   --   --  1.3*  --   --   --   --   --   --   HEPARINUNFRC  --   --   --   --   --  1.01*   < > 0.84* 0.60 0.37  CREATININE 0.63  --   --   --   --   --   --   --   --   --   TROPONINIHS 25* 24*  --   --   --   --   --   --   --   --    < > = values in this interval not displayed.     Estimated Creatinine Clearance: 47 mL/min (by C-G formula based  on SCr of 0.63 mg/dL).   Medical History: Past Medical History:  Diagnosis Date   AAA (abdominal aortic aneurysm)    Anemia    Arthritis    Neck   Bladder prolapse, female, acquired    Carotid artery occlusion    Endoleak post endovascular aneurysm repair [facility]), Type I 02/2016   [facility] for aortic cuff placement   Hearing aid worn    Hypothyroidism    Myocardial infarction ([place]) 1982   Polymyalgia rheumatica ([place])    Prolapsed internal hemorrhoids, grade 3    Shingles    Uterine prolapse  Medications:  Per chart review, no anticoagulation noted prior to admission  Assessment: 86 y.o. female with a past medical history of hypothyroidism, hyperlipidemia, AAA status post endograft repair, carotid artery disease status post carotid enterectomy. Pharmacy has been consulted for heparin.   Goal of Therapy:  Heparin level 0.3-0.7 units/ml Monitor platelets by anticoagulation protocol: Yes   Plan:  12/27: HL @ 1711 = 0.37, therapeutic x 2 Will continue heparin infusion at 550 units/hr.  Will check HL with AM labs CBC daily while on IV heparin   Raiford Noble, PharmD Clinical Pharmacist 08/17/2021 6:00 PM

## 2021-08-17 NOTE — Progress Notes (Signed)
°   08/17/21 0704  Assess: MEWS Score  Temp 97.6 F (36.4 C)  BP (!) 80/42  Pulse Rate (!) 56  Resp 18  Level of Consciousness Alert  SpO2 96 %  O2 Device Room Air  Assess: MEWS Score  MEWS Temp 0  MEWS Systolic 2  MEWS Pulse 0  MEWS RR 0  MEWS LOC 0  MEWS Score 2  MEWS Score Color Yellow  Assess: if the MEWS score is Yellow or Red  Were vital signs taken at a resting state? Yes  Focused Assessment Change from prior assessment (see assessment flowsheet)  Does the patient meet 2 or more of the SIRS criteria? No  MEWS guidelines implemented *See Row Information* Yes  Treat  MEWS Interventions Administered prn meds/treatments;Other (Comment) (will give another LR bolus)  Pain Scale 0-10  Pain Score 0  Take Vital Signs  Increase Vital Sign Frequency  Yellow: Q 2hr X 2 then Q 4hr X 2, if remains yellow, continue Q 4hrs  Escalate  MEWS: Escalate Yellow: discuss with charge nurse/RN and consider discussing with provider and RRT  Notify: Charge Nurse/RN  Name of Charge Nurse/RN Notified Marcella RN  Date Charge Nurse/RN Notified 08/17/21  Time Charge Nurse/RN Notified 2423  Notify: Provider  Provider Name/Title Dr Katrinka Blazing  Date Provider Notified 08/17/21  Time Provider Notified [phone]  Notification Type Page  Notification Reason Change in status;Other (Comment) (hypotensive)  Provider response See new orders  Date of Provider Response 08/17/21  Time of Provider Response [phone]  Document  Patient Outcome Other (Comment) (ongoing bolus right now, will continue to monitor)  Assess: SIRS CRITERIA  SIRS Temperature  0  SIRS Pulse 0  SIRS Respirations  0  SIRS WBC 0  SIRS Score Sum  0   V/S rechecked after LR 500 ml bolus, bp still low at 80/42. Pt denies dizziness or lightheadedness. Another bolus running right now per Dr order.

## 2021-08-17 NOTE — TOC Progression Note (Signed)
Transition of Care [facility]) - Progression Note    Patient Details  Name: Margaret Castillo MRN: [id number] Date of Birth: 1935-05-13  [facility] CM/SW Contact  Chapman Fitch, RN Phone Number: 08/17/2021, 5:07 PM  Clinical Narrative:    Message sent to Stanton Kidney at [facility] inquiring if patient is LTC.  PT recommending SNF.  I have asked that she go ahead and start insurance authorization.  Patient tested positive for covid on admission from [facility]         Expected Discharge Plan and Services                                                 Social Determinants of Health (SDOH) Interventions    Readmission Risk Interventions No flowsheet data found.

## 2021-08-17 NOTE — Evaluation (Addendum)
Physical Therapy Evaluation Patient Details Name: Margaret Castillo MRN: [id number] DOB: 01/25/1935 Today's Date: 08/17/2021  History of Present Illness  Margaret Castillo is an 29yoF who comes to [facility] on 12/25 for NV. Pt admitted with COVID gastroenteritis. Pt resides at [place]. Imaging revealing of T12 fracture which per chart is > 4 months old at this point, also noted progression of AAA s/p EVAR, now at 8.3cm, followed by vascular here, inititally recommending transfer, but [facility] vascular reports it can be managed as outpatient. Pt also followed as an outpatient for chronic repeated syncope and unresponsive episodes, neurogenic orthostasis and dysautonomia, not a candidate for medical therapies due to AAA, recommendations for ABD binder previously. Baselline dementia.  Clinical Impression  Pt admitted with above diagnosis. Pt currently with functional limitations due to the deficits listed below (see "PT Problem List"). Upon entry, pt in bed, awake, working on lunch. The pt is alert, pleasant, interactive, and able to provide very little info regarding prior level of function, both in tolerance and independence. Chart review revealing of PMH dysautonomia and frequent syncope, described in OP notes as "frequent loss of consciousness with almost any positional change." Pt is distracted in session, continually feels to be sitting on something, although she is repositioned several times, bed checked for items (none found), she continued to complain of sitting on something after purewick is removed- maybe her sacral prophylactic dressing is to be blamed. Pt rolling well in bed. Needs assist for scooting up. Pt denies any pain, reports to feel at baseline. Pt has no acute rehab needs at this time. Recommend daily mobility with NSG to chair. Would not suggest any AMB at this time given her memory issues and recurrent syncope and dysautonomia. Facility can determine post DC needs upon return.        Recommendations for follow up therapy are one component of a multi-disciplinary discharge planning process, led by the attending physician.  Recommendations may be updated based on patient status, additional functional criteria and insurance authorization.  Follow Up Recommendations  Long term institutional care without follow-up therapy     Assistance Recommended at Discharge Intermittent Supervision/Assistance  Functional Status Assessment Patient has not had a recent decline in their functional status  Equipment Recommendations  None recommended by PT    Recommendations for Other Services       Precautions / Restrictions Precautions Precautions: Fall Precaution Comments: AAA (watch pressures); neurogenic dysautonomia and syncope Restrictions Weight Bearing Restrictions: No      Mobility  Bed Mobility Overal bed mobility: Modified Independent;Needs Assistance Bed Mobility: Rolling Rolling: Modified independent (Device/Increase time)         General bed mobility comments: really good at rolling, but needs help with sitting up in bed    Transfers Overall transfer level:  (deffered at this time)                      Ambulation/Gait                  Stairs            Wheelchair Mobility    Modified Rankin (Stroke Patients Only)       Balance                                             Pertinent Vitals/Pain Pain  Assessment: No/denies pain    Home Living Family/patient expects to be discharged to:: Skilled nursing facility                   Additional Comments: [facility] since July 2019    Prior Function Prior Level of Function : Needs assist             Mobility Comments: recurrent syncope and dysautonomia, not AMB much, but pt is poor historian ADLs Comments: needs assist     Hand Dominance        Extremity/Trunk Assessment   Upper Extremity Assessment Upper Extremity Assessment:  Generalized weakness;Overall [facility] for tasks assessed    Lower Extremity Assessment Lower Extremity Assessment: Generalized weakness;Overall [facility] for tasks assessed       Communication      Cognition Arousal/Alertness: Awake/alert Behavior During Therapy: Impulsive;Restless Overall Cognitive Status: History of cognitive impairments - at baseline                                          General Comments      Exercises     Assessment/Plan    PT Assessment Patient does not need any further PT services;All further PT needs can be met in the next venue of care  PT Problem List Decreased activity tolerance;Decreased mobility;Decreased cognition       PT Treatment Interventions      PT Goals (Current goals can be found in the Care Plan section)  Acute Rehab PT Goals PT Goal Formulation: All assessment and education complete, DC therapy    Frequency     Barriers to discharge        Co-evaluation               AM-PAC PT "6 Clicks" Mobility  Outcome Measure Help needed turning from your back to your side while in a flat bed without using bedrails?: A Little Help needed moving from lying on your back to sitting on the side of a flat bed without using bedrails?: A Little Help needed moving to and from a bed to a chair (including a wheelchair)?: A Lot Help needed standing up from a chair using your arms (e.g., wheelchair or bedside chair)?: A Lot Help needed to walk in hospital room?: Total Help needed climbing 3-5 steps with a railing? : Total 6 Click Score: 12    End of Session   Activity Tolerance: Patient tolerated treatment well;No increased pain Patient left: in bed;with call bell/phone within reach;with bed alarm set Nurse Communication: Mobility status PT Visit Diagnosis: Other symptoms and signs involving the nervous system (R29.898);Difficulty in walking, not elsewhere classified (R26.2)    Time: 4665-9935 PT Time Calculation (min) (ACUTE  ONLY): 22 min   Charges:   PT Evaluation $PT Eval High Complexity: 1 High         4:30 PM, 08/17/21 Margaret Castillo, PT, DPT Physical Therapist - [facility]  [phone] ([place])    Margaret Castillo 08/17/2021, 4:25 PM

## 2021-08-17 NOTE — Progress Notes (Signed)
Bp low at 90/42, HR 57. Pt denies dizziness or lighheadedness. Oncall provider informed. LR 500 bolus given as per Dr.

## 2021-08-17 NOTE — Progress Notes (Signed)
[facility] for heparin infusion Indication: pulmonary embolus  Allergies  Allergen Reactions   Tape Other (See Comments)    Skin irritation   Atorvastatin Other (See Comments)    MUSCLE PAIN (all statins)   Codeine Nausea Only   Crestor [Rosuvastatin] Other (See Comments)    Leg pain/ joint pain  ( ALL THE STATIN's )   Penicillins Rash    Has patient had a PCN reaction causing immediate rash, facial/tongue/throat swelling, SOB or lightheadedness with hypotension: Yes Has patient had a PCN reaction causing severe rash involving mucus membranes or skin necrosis: No Has patient had a PCN reaction that required hospitalization No Has patient had a PCN reaction occurring within the last 10 years: No If all of the above answers are "NO", then may proceed with Cephalosporin use.    Cephalexin Rash    Patient Measurements: Height: 5\' 5"  (165.1 cm) Weight: 56.9 kg (125 lb 7.1 oz) IBW/kg (Calculated) : 57 kg   Vital Signs: Temp: 98.1 F (36.7 C) (12/26 2101) Temp Source: Oral (12/26 2101) BP: 92/44 (12/26 2101) Pulse Rate: 73 (12/26 2101)  Labs: Recent Labs    08/15/21 0359 08/15/21 0559 08/15/21 1605 08/15/21 1802 08/15/21 2219 08/16/21 0304 08/16/21 1444 08/16/21 2323  HGB 10.9*  --  9.6*  --  10.5* 9.1*  --   --   HCT 32.0*  --  28.0*  --  30.3* 26.6*  --   --   PLT 397  --   --   --   --  280  --   --   APTT  --   --   --  33  --   --   --   --   LABPROT  --   --   --  16.6*  --   --   --   --   INR  --   --   --  1.3*  --   --   --   --   HEPARINUNFRC  --   --   --   --   --  1.01* 0.93* 0.84*  CREATININE 0.63  --   --   --   --   --   --   --   TROPONINIHS 25* 24*  --   --   --   --   --   --      Estimated Creatinine Clearance: 47 mL/min (by C-G formula based on SCr of 0.63 mg/dL).   Medical History: Past Medical History:  Diagnosis Date   AAA (abdominal aortic aneurysm)    Anemia    Arthritis    Neck   Bladder  prolapse, female, acquired    Carotid artery occlusion    Endoleak post endovascular aneurysm repair [facility]), Type I 02/2016   [facility] for aortic cuff placement   Hearing aid worn    Hypothyroidism    Myocardial infarction [facility]) 1982   Polymyalgia rheumatica ([place])    Prolapsed internal hemorrhoids, grade 3    Shingles    Uterine prolapse     Medications:  Per chart review, no anticoagulation noted prior to admission  Assessment: 86 y.o. female with a past medical history of hypothyroidism, hyperlipidemia, AAA status post endograft repair, carotid artery disease status post carotid enterectomy. Pharmacy has been consulted for heparin.   Goal of Therapy:  Heparin level 0.3-0.7 units/ml Monitor platelets by anticoagulation protocol: Yes   Plan:  12/26: HL @  2323 = 0.84, SUPRAtherapeutic  Will decrease heparin drip to 550 units/hr.  Will recheck HL 8 hrs after rate change.  Otelia Sergeant, PharmD, [facility] 08/17/2021 12:56 AM

## 2021-08-17 NOTE — Progress Notes (Signed)
ANTICOAGULATION CONSULT NOTE  Pharmacy Consult for heparin infusion Indication: pulmonary embolus  Allergies  Allergen Reactions   Tape Other (See Comments)    Skin irritation   Atorvastatin Other (See Comments)    MUSCLE PAIN (all statins)   Codeine Nausea Only   Crestor [Rosuvastatin] Other (See Comments)    Leg pain/ joint pain  ( ALL THE STATIN's )   Penicillins Rash    Has patient had a PCN reaction causing immediate rash, facial/tongue/throat swelling, SOB or lightheadedness with hypotension: Yes Has patient had a PCN reaction causing severe rash involving mucus membranes or skin necrosis: No Has patient had a PCN reaction that required hospitalization No Has patient had a PCN reaction occurring within the last 10 years: No If all of the above answers are "NO", then may proceed with Cephalosporin use.    Cephalexin Rash    Patient Measurements: Height: 5\' 5"  (165.1 cm) Weight: 56.9 kg (125 lb 7.1 oz) IBW/kg (Calculated) : 57 kg   Vital Signs: Temp: 97.8 F (36.6 C) (12/27 0900) Temp Source: Oral (12/27 0900) BP: 148/122 (12/27 0900) Pulse Rate: 62 (12/27 0900)  Labs: Recent Labs    08/15/21 0359 08/15/21 0559 08/15/21 1605 08/15/21 1802 08/15/21 2219 08/15/21 2219 08/16/21 0304 08/16/21 1444 08/16/21 2323 08/17/21 0909  HGB 10.9*  --    < >  --  10.5*  --  9.1*  --   --  7.7*  HCT 32.0*  --    < >  --  30.3*  --  26.6*  --   --  22.2*  PLT 397  --   --   --   --   --  280  --   --  268  APTT  --   --   --  33  --   --   --   --   --   --   LABPROT  --   --   --  16.6*  --   --   --   --   --   --   INR  --   --   --  1.3*  --   --   --   --   --   --   HEPARINUNFRC  --   --   --   --   --    < > 1.01* 0.93* 0.84* 0.60  CREATININE 0.63  --   --   --   --   --   --   --   --   --   TROPONINIHS 25* 24*  --   --   --   --   --   --   --   --    < > = values in this interval not displayed.     Estimated Creatinine Clearance: 47 mL/min (by C-G formula  based on SCr of 0.63 mg/dL).   Medical History: Past Medical History:  Diagnosis Date   AAA (abdominal aortic aneurysm)    Anemia    Arthritis    Neck   Bladder prolapse, female, acquired    Carotid artery occlusion    Endoleak post endovascular aneurysm repair [facility]), Type I 02/2016   [facility] for aortic cuff placement   Hearing aid worn    Hypothyroidism    Myocardial infarction (HCC) 1982   Polymyalgia rheumatica (HCC)    Prolapsed internal hemorrhoids, grade 3    Shingles    Uterine prolapse  Medications:  Per chart review, no anticoagulation noted prior to admission  Assessment: 86 y.o. female with a past medical history of hypothyroidism, hyperlipidemia, AAA status post endograft repair, carotid artery disease status post carotid enterectomy. Pharmacy has been consulted for heparin.   Goal of Therapy:  Heparin level 0.3-0.7 units/ml Monitor platelets by anticoagulation protocol: Yes   Plan:  12/27: HL @ 0909 = 0.60, therapeutic x1  Will continue heparin infusion at 550 units/hr.  Will check confirmatory HL in 8 hrs   Pearla Dubonnet, PharmD Clinical Pharmacist 08/17/2021 10:23 AM

## 2021-08-17 NOTE — Progress Notes (Signed)
Vein and Vascular Surgery  Daily Progress Note   Subjective  -   Patient denies any current abdominal pain.  Says she ate okay today.  The history is difficult to obtain as she is very hard of hearing and cannot understand me completely.  I have also discussed the situation with her grandson.  Not having any respiratory symptoms currently.  GI symptoms seem to be improving.  No major issues overnight.  Objective Vitals:   08/17/21 0805 08/17/21 0900 08/17/21 1100 08/17/21 1528  BP: (!) 91/36 (!) 148/122 (!) 108/48 (!) 108/46  Pulse: 61 62 70 68  Resp: 18 18 18 18   Temp: 97.6 F (36.4 C) 97.8 F (36.6 C) (!) 97.5 F (36.4 C) 97.9 F (36.6 C)  TempSrc: Oral Oral Oral   SpO2: 99% 99% 99% 100%  Weight:      Height:        Intake/Output Summary (Last 24 hours) at 08/17/2021 1605 Last data filed at 08/17/2021 1416 Gross per 24 hour  Intake 955.67 ml  Output --  Net 955.67 ml    PULM  CTAB CV  RRR VASC  increased aortic impulse.  Not overly tender overlying her aorta.  Laboratory CBC    Component Value Date/Time   WBC 4.4 08/17/2021 0909   HGB 7.7 (L) 08/17/2021 0909   HCT 22.2 (L) 08/17/2021 0909   PLT 268 08/17/2021 0909    BMET    Component Value Date/Time   NA 136 08/15/2021 0359   K 3.6 08/15/2021 0359   CL 104 08/15/2021 0359   CO2 24 08/15/2021 0359   GLUCOSE 142 (H) 08/15/2021 0359   BUN 12 08/15/2021 0359   CREATININE 0.63 08/15/2021 0359   CALCIUM 8.2 (L) 08/15/2021 0359   GFRNONAA >60 08/15/2021 0359   GFRAA >60 03/23/2018 0319    Assessment/Planning:   Enlarging abdominal aortic aneurysm status post previous stent graft repair I have independently reviewed her CT angiogram and this pretty clearly appears to be a large type II endoleak.  Although she is aneurysmal in the supraceliac aorta, she appears to have a good seal proximally and distally.  He can also see the large lumbar arteries feeding the aneurysm sac.  I think this can be  addressed with coil embolization of the feeding branches into the aneurysm sac would be appropriate and should be done soon due to the very large size of the aneurysm sac.  I have put her on the schedule for tomorrow for angiogram and possible embolization.  Risks and benefits were discussed with the patient as well as the grandson. Celiac and SMA disease.  Appears that both vessels are fairly diseased.  She is currently not having any significant abdominal pain or worry for acute mesenteric ischemia.  May have a component of chronic visceral ischemia.  We can address this over time and consider treatment as an outpatient for this with celiac or SMA intervention in the future as needed. COVID infection.  Has had COVID for over 2 weeks now and continues to test positive without symptoms.  Not sure she would needs to remain in isolation and we will not delay procedures for this. Gastroenteritis likely due to COVID-19 virus.  Continue supportive care.  Appears to be improving. Coronary artery disease.  On aspirin and a statin agent at home. Pulmonary embolus.  I am okay with anticoagulation for pulmonary embolus.  Given her advanced age and other comorbidities, a lower dose of Eliquis is certainly reasonable as  well. Overall, the patient is somewhat complex with multiple ongoing issues and several different vascular issues as described above.  Her enlarging AAA is the most worrisome and primary issue and we will address this tomorrow.   Festus Barren  08/17/2021, 4:05 PM

## 2021-08-17 NOTE — Progress Notes (Addendum)
Patient has not had any further episodes of coffee-ground emesis and remains hemodynamically stable.  Vascular surgery seeing her and planning on angiogram tomorrow  I was able to speak to the patient's family, her grandson listed as her contact, I discussed the finding of coffee-ground emesis on admission in the context of her other acute medical issues  We discussed endoscopy for evaluation of her symptoms versus conservative management.  We discussed that endoscopy would help evaluate for any underlying ulcers, esophagitis, gastritis and rule out any concerning masses or malignancy in the upper GI tract.  With her ongoing issues, family prefers not to undergo endoscopy at this time since her coffee-ground emesis has resolved  Based on the above discussion, we will not be scheduling patient for endoscopic procedures.  In addition, in the absence of any further GI bleeding, patient being COVID-positive, and her acute vascular findings, risks of procedure would outweigh benefits at this time  However, if symptoms change or coffee-ground emesis reoccurs or anemia worsens, please page GI on-call at any time  PPI IV twice daily  Continue serial CBCs and transfuse PRN Avoid NSAIDs Maintain 2 large-bore IV lines Please page GI with any acute hemodynamic changes, or signs of active GI bleeding  GI clinic follow-up in 4 to 6 weeks after discharge to discuss if an outpatient endoscopy would be needed for this patient  GI service will sign off at this time

## 2021-08-17 NOTE — Progress Notes (Signed)
°   08/17/21 0900  Assess: MEWS Score  Temp 97.8 F (36.6 C)  BP (!) 148/122  Pulse Rate 62  Resp 18  SpO2 99 %  O2 Device Room Air  Assess: MEWS Score  MEWS Temp 0  MEWS Systolic 0  MEWS Pulse 0  MEWS RR 0  MEWS LOC 0  MEWS Score 0  MEWS Score Color Green  Treat  Pain Scale 0-10  Pain Score 0  Document  Patient Outcome Stabilized after interventions  Assess: SIRS CRITERIA  SIRS Temperature  0  SIRS Pulse 0  SIRS Respirations  0  SIRS WBC 0  SIRS Score Sum  0

## 2021-08-18 ENCOUNTER — Encounter
Admission: EM | Disposition: A | Discharge status: Skilled Nursing Facility | Payer: Self-pay | Source: Skilled Nursing Facility | Attending: Internal Medicine

## 2021-08-18 DIAGNOSIS — I714 Abdominal aortic aneurysm, without rupture, unspecified: Secondary | ICD-10-CM

## 2021-08-18 DIAGNOSIS — Z515 Encounter for palliative care: Secondary | ICD-10-CM | POA: Diagnosis not present

## 2021-08-18 DIAGNOSIS — Z789 Other specified health status: Secondary | ICD-10-CM | POA: Diagnosis not present

## 2021-08-18 DIAGNOSIS — U071 COVID-19: Secondary | ICD-10-CM | POA: Diagnosis not present

## 2021-08-18 DIAGNOSIS — R112 Nausea with vomiting, unspecified: Secondary | ICD-10-CM | POA: Diagnosis not present

## 2021-08-18 DIAGNOSIS — A0839 Other viral enteritis: Secondary | ICD-10-CM | POA: Diagnosis not present

## 2021-08-18 HISTORY — PX: EMBOLIZATION: CATH118239

## 2021-08-18 HISTORY — PX: ABDOMINAL AORTOGRAM: CATH118222

## 2021-08-18 LAB — CBC
HCT: 21.3 % — ABNORMAL LOW (ref 36.0–46.0)
Hemoglobin: 7.4 g/dL — ABNORMAL LOW (ref 12.0–15.0)
MCH: 35.6 pg — ABNORMAL HIGH (ref 26.0–34.0)
MCHC: 34.7 g/dL (ref 30.0–36.0)
MCV: 102.4 fL — ABNORMAL HIGH (ref 80.0–100.0)
Platelets: 243 10*3/uL (ref 150–400)
RBC: 2.08 MIL/uL — ABNORMAL LOW (ref 3.87–5.11)
RDW: 18.8 % — ABNORMAL HIGH (ref 11.5–15.5)
WBC: 3.6 10*3/uL — ABNORMAL LOW (ref 4.0–10.5)
nRBC: 0.6 % — ABNORMAL HIGH (ref 0.0–0.2)

## 2021-08-18 LAB — RETICULOCYTES
Immature Retic Fract: 13.8 % (ref 2.3–15.9)
RBC.: 2.07 MIL/uL — ABNORMAL LOW (ref 3.87–5.11)
Retic Count, Absolute: 33.7 10*3/uL (ref 19.0–186.0)
Retic Ct Pct: 1.6 % (ref 0.4–3.1)

## 2021-08-18 LAB — FERRITIN: Ferritin: 1045 ng/mL — ABNORMAL HIGH (ref 11–307)

## 2021-08-18 LAB — IRON AND TIBC
Iron: 102 ug/dL (ref 28–170)
Saturation Ratios: 82 % — ABNORMAL HIGH (ref 10.4–31.8)
TIBC: 125 ug/dL — ABNORMAL LOW (ref 250–450)
UIBC: 23 ug/dL

## 2021-08-18 LAB — FOLATE: Folate: 6.7 ng/mL (ref >5.9)

## 2021-08-18 LAB — PREPARE RBC (CROSSMATCH)

## 2021-08-18 LAB — HEPARIN LEVEL (UNFRACTIONATED): Heparin Unfractionated: 0.43 IU/mL (ref 0.30–0.70)

## 2021-08-18 LAB — VITAMIN B12: Vitamin B-12: 3686 pg/mL — ABNORMAL HIGH (ref 180–914)

## 2021-08-18 SURGERY — ABDOMINAL AORTOGRAM
Anesthesia: Moderate Sedation

## 2021-08-18 MED ORDER — SODIUM CHLORIDE 0.9% IV SOLUTION: Freq: Once | INTRAVENOUS | Status: AC

## 2021-08-18 MED ORDER — FENTANYL CITRATE PF 50 MCG/ML IJ SOSY
PREFILLED_SYRINGE | INTRAMUSCULAR | Status: AC
  Filled 2021-08-18: qty 2

## 2021-08-18 MED ORDER — VANCOMYCIN HCL IN DEXTROSE 1-5 GM/200ML-% IV SOLN
1000.0000 mg | INTRAVENOUS | Status: DC
  Administered 2021-08-18: 15:00:00 1000 mg via INTRAVENOUS
  Filled 2021-08-18 (×2): qty 200

## 2021-08-18 MED ORDER — MIDAZOLAM HCL 2 MG/2ML IJ SOLN
INTRAMUSCULAR | Status: DC | PRN
  Administered 2021-08-18: 1 mg via INTRAVENOUS

## 2021-08-18 MED ORDER — HEPARIN SODIUM (PORCINE) 1000 UNIT/ML IJ SOLN
INTRAMUSCULAR | Status: DC | PRN
  Administered 2021-08-18: 3000 [IU] via INTRAVENOUS

## 2021-08-18 MED ORDER — MIDAZOLAM HCL 2 MG/2ML IJ SOLN
INTRAMUSCULAR | Status: AC
  Filled 2021-08-18: qty 2

## 2021-08-18 MED ORDER — FENTANYL CITRATE (PF) 100 MCG/2ML IJ SOLN
INTRAMUSCULAR | Status: DC | PRN
  Administered 2021-08-18: 12.5 ug via INTRAVENOUS
  Administered 2021-08-18: 50 ug via INTRAVENOUS

## 2021-08-18 MED ORDER — SODIUM CHLORIDE 0.9 % IV SOLN: INTRAVENOUS | Status: DC

## 2021-08-18 SURGICAL SUPPLY — 33 items
CATH BEACON 5 .035 40 KMP TP (CATHETERS) IMPLANT
CATH BEACON 5 .038 40 KMP TP (CATHETERS) ×4
CATH MICROCATH PRGRT 2.8F 110 (CATHETERS) IMPLANT
CATH MICROCATH PRGRT 2.8F 130 (MICROCATHETER) IMPLANT
CATH PIG 70CM (CATHETERS) ×2 IMPLANT
CATH TEMPO 5F RIM 65CM (CATHETERS) ×2 IMPLANT
CATH VS15FR (CATHETERS) ×2 IMPLANT
COIL 400 COMPLEX SOFT 2X4CM (Vascular Products) ×2 IMPLANT
COIL 400 COMPLEX SOFT 3X15CM (Vascular Products) ×4 IMPLANT
COIL 400 COMPLEX SOFT 4X15CM (Vascular Products) ×2 IMPLANT
COIL 400 COMPLEX STD 3X12CM (Vascular Products) ×2 IMPLANT
COIL 400 COMPLEX STD 3X20CM (Vascular Products) ×4 IMPLANT
COIL 400 COMPLEX STD 4X30CM (Vascular Products) ×2 IMPLANT
COIL 400 COMPLEX STD 4X35CM (Vascular Products) ×2 IMPLANT
COIL 400 COMPLEX STD 8X40CM (Vascular Products) ×2 IMPLANT
COVER PROBE U/S 5X48 (MISCELLANEOUS) ×2 IMPLANT
DEVICE OCCLUSION POD3 (Embolic) IMPLANT
DEVICE OCCLUSION PODJ30 (Vascular Products) IMPLANT
DEVICE OCCLUSION PODJ60 (Vascular Products) IMPLANT
DEVICE STARCLOSE SE CLOSURE (Vascular Products) ×4 IMPLANT
DEVICE TORQUE (MISCELLANEOUS) ×2 IMPLANT
GLIDEWIRE STIFF .35X180X3 HYDR (WIRE) ×4 IMPLANT
HANDLE DETACHMENT COIL (MISCELLANEOUS) ×2 IMPLANT
MICROCATH PROGREAT 2.8F 110 CM (CATHETERS) ×4
MICROCATH PROGREAT 2.8F 130CM (MICROCATHETER) ×4
OCCLUSION DEVICE POD3 (Embolic) ×4 IMPLANT
OCCLUSION DEVICE PODJ30 (Vascular Products) ×4 IMPLANT
OCCLUSION DEVICE PODJ60 (Vascular Products) IMPLANT
SHEATH BRITE TIP 8FRX11 (SHEATH) ×2 IMPLANT
SHEATH PINNACLE 5F 10CM (SHEATH) ×4 IMPLANT
SYR MEDRAD MARK 7 150ML (SYRINGE) ×2 IMPLANT
TUBING CONTRAST HIGH PRESS 72 (TUBING) ×2 IMPLANT
WIRE GUIDERIGHT .035X150 (WIRE) ×2 IMPLANT

## 2021-08-18 NOTE — Interval H&P Note (Signed)
History and Physical Interval Note:  08/18/2021 2:05 PM  Margaret Castillo  has presented today for surgery, with the diagnosis of AAA.  The various methods of treatment have been discussed with the patient and family. After consideration of risks, benefits and other options for treatment, the patient has consented to  Procedure(s) with comments: ABDOMINAL AORTOGRAM (N/A) - with endoleak coiling as a surgical intervention.  The patient's history has been reviewed, patient examined, no change in status, stable for surgery.  I have reviewed the patient's chart and labs.  Questions were answered to the patient's satisfaction.     Festus Barren

## 2021-08-18 NOTE — TOC Progression Note (Signed)
Transition of Care [facility]) - Progression Note    Patient Details  Name: Margaret Castillo MRN: [id number] Date of Birth: [birth year]  Transition of [facility]) CM/SW Contact  Chapman Fitch, RN Phone Number: 08/18/2021, 3:00 PM  Clinical Narrative:    Per [facility] positive covid test date at SNF was 07/23/21        Expected Discharge Plan and Services                                                 Social Determinants of Health (SDOH) Interventions    Readmission Risk Interventions No flowsheet data found.

## 2021-08-18 NOTE — Op Note (Signed)
VEIN AND VASCULAR SURGERY   OPERATIVE NOTE  DATE: 08/18/2021  PRE-OPERATIVE DIAGNOSIS: Enlarging abdominal aortic aneurysm with previous stent graft repair and large type II endoleak's  POST-OPERATIVE DIAGNOSIS: same as above  PROCEDURE: 1.   Ultrasound guidance for vascular access bilateral femoral arteries 2.  Catheter placement into 2 tertiary right hypogastric artery branches from right femoral approach 3.  Catheter placement into a tertiary left hypogastric artery branch from left femoral approach 4.  Aortogram and bilateral hypogastric artery angiograms including superselective hypogastric artery angiograms for the arteries feeding the aneurysm sac 5.  Coil embolization of 2 tertiary right hypogastric artery branches with a series of Ruby coils 6.  Coil embolization of a tertiary left hypogastric artery branch as well as the main left hypogastric artery with a series of Ruby coils 7.  StarClose closure device bilateral femoral arteries  SURGEON: Festus Barren  ASSISTANT(S): None  ANESTHESIA: Local with moderate conscious sedation for approximately 93 minutes using a total of 1 mg of Versed and 62.5 mcg of Fentanyl  ESTIMATED BLOOD LOSS: 15 cc  FINDING(S): 1.  A large number of hypogastric artery branches feeding the aneurysm sac from both hypogastric arteries.  No type I or III endoleak identified on aortogram.   INDICATIONS:   Margaret Castillo is a 86 y.o. female who presents with an enlarging abdominal aortic aneurysm with a large type II endoleak after previous stent graft repair.  This is now greater than 8 cm.  She is brought in for an attempt at coil embolization of the branches feeding the aneurysm sac.  Risks and benefits were discussed and informed consent was obtained.  DESCRIPTION: After obtaining full informed written consent, the patient was brought back to the operating room and placed supine upon the operating table.  The patient was prepped and draped in  the standard fashion.  I began by accessing the right femoral artery under direct ultrasound guidance without difficulty with a Seldinger needle and a permanent image was recorded.  A 5 French sheath was placed.  Pigtail catheter was placed in the aorta above the stent graft and aortogram was performed.  This showed the renal arteries to be patent.  The stent graft itself was patent and there was no type I or III endoleak.  From this initial image, there were a large number of branches feeding the aneurysm sac largely from the hypogastric arteries bilaterally.  I then began by performing coil embolization of the right hypogastric artery branches.  Using a rim catheter, was able to selectively cannulate the right hypogastric artery and selective imaging showed 2 tertiary branches feeding the aneurysm sac directly.  I was able to initially cannulate the larger of these 2 and feet a prograde microcatheter all the way to just outside the aneurysm sac.  We would then performed coil embolization back to near the origin of this vessel with a series of 2, 3, and then a 4 Ruby coil.  The other tertiary branch was then cannulated and after selective imaging showed this feeding the sac, it was coiled last with 3 mm Ruby coils.  With the rim catheter and the main right hypogastric artery, no further flow was really seen into the aneurysm sac.  There was a lot of feeding to the IMA which with her celiac and SMA disease was not surprising.  I then turned my attention to the left side.  The rim catheter was removed on the right.  The left femoral artery was then  accessed under direct ultrasound guidance without difficulty with a Seldinger needle and a permanent image was recorded.  A J-wire and 5 French sheath were placed.  I tried to cannulate the left hypogastric artery with the rim catheter, but due to the steep angle I ultimately had to exchanged for a V S1 catheter to cannulate the left hypogastric artery.  This was then  imaged with selective image performed showing several branches some of which off of the main hypogastric artery and some is tertiary hypogastric artery branches feeding the aneurysm sac.  I was able to cannulate the largest of these with a prograde microcatheter and perform embolization with initially a 2 mm Ruby coil and then 3 mm Ruby coils back to its origin.  Many of these other branches were coming off steep angulations and I elected to place a large 8 mm Ruby coil in the main hypogastric artery to help diminish the flow through these branches.  Following these deployments, there was markedly reduced blood flow through the branches going to the aneurysm sac and I elected to terminate the procedure.  StarClose closure device deployed in the left femoral artery with excellent hemostatic result.  Similarly, StarClose closure device deployed in the right femoral artery with excellent hemostatic result. At this point, we elected to complete the procedure.  The patient was taken to the recovery room in stable condition.   COMPLICATIONS: None  CONDITION: Stable  Festus Barren  08/18/2021, 3:56 PM    This note was created with Dragon Medical transcription system. Any errors in dictation are purely unintentional.

## 2021-08-18 NOTE — Progress Notes (Signed)
ANTICOAGULATION CONSULT NOTE  Pharmacy Consult for heparin infusion Indication: pulmonary embolus  Allergies  Allergen Reactions   Tape Other (See Comments)    Skin irritation   Atorvastatin Other (See Comments)    MUSCLE PAIN (all statins)   Codeine Nausea Only   Crestor [Rosuvastatin] Other (See Comments)    Leg pain/ joint pain  ( ALL THE STATIN's )   Penicillins Rash    Has patient had a PCN reaction causing immediate rash, facial/tongue/throat swelling, SOB or lightheadedness with hypotension: Yes Has patient had a PCN reaction causing severe rash involving mucus membranes or skin necrosis: No Has patient had a PCN reaction that required hospitalization No Has patient had a PCN reaction occurring within the last 10 years: No If all of the above answers are "NO", then may proceed with Cephalosporin use.    Cephalexin Rash    Patient Measurements: Height: 5\' 5"  (165.1 cm) Weight: 56.9 kg (125 lb 7.1 oz) IBW/kg (Calculated) : 57 kg   Vital Signs: Temp: 97.6 F (36.4 C) (12/28 0321) Temp Source: Oral (12/28 0321) BP: 114/48 (12/28 0321) Pulse Rate: 67 (12/28 0321)  Labs: Recent Labs    08/15/21 1802 08/15/21 2219 08/16/21 0304 08/16/21 1444 08/17/21 0909 08/17/21 1711 08/18/21 0521  HGB  --    < > 9.1*  --  7.7*  --  7.4*  HCT  --    < > 26.6*  --  22.2*  --  21.3*  PLT  --   --  280  --  268  --  243  APTT 33  --   --   --   --   --   --   LABPROT 16.6*  --   --   --   --   --   --   INR 1.3*  --   --   --   --   --   --   HEPARINUNFRC  --   --  1.01*   < > 0.60 0.37 0.43   < > = values in this interval not displayed.     Estimated Creatinine Clearance: 47 mL/min (by C-G formula based on SCr of 0.63 mg/dL).   Medical History: Past Medical History:  Diagnosis Date   AAA (abdominal aortic aneurysm)    Anemia    Arthritis    Neck   Bladder prolapse, female, acquired    Carotid artery occlusion    Endoleak post endovascular aneurysm repair [facility]),  Type I 02/2016   [facility] for aortic cuff placement   Hearing aid worn    Hypothyroidism    Myocardial infarction [facility]) 1982   Polymyalgia rheumatica (HCC)    Prolapsed internal hemorrhoids, grade 3    Shingles    Uterine prolapse     Medications:  Per chart review, no anticoagulation noted prior to admission  Assessment: 86 y.o. female with a past medical history of hypothyroidism, hyperlipidemia, AAA status post endograft repair, carotid artery disease status post carotid enterectomy. Pharmacy has been consulted for heparin.   Goal of Therapy:  Heparin level 0.3-0.7 units/ml Monitor platelets by anticoagulation protocol: Yes   Plan:  12/28: HL @ 0521 = 0.43, therapeutic x 3 Will continue heparin infusion at 550 units/hr.  Will check HL daily with AM labs CBC daily while on IV heparin   1/29, PharmD, [facility] 08/18/2021 6:55 AM

## 2021-08-18 NOTE — Progress Notes (Signed)
Patient ID: Margaret Castillo, female   DOB: 1935-04-18, 86 y.o.   MRN: [id number]  Subjective: The patient has no new complaints pertaining to her back.  While in bed, she feels that her back discomfort is quite manageable.  However, she has not been mobilized due to her large abdominal aortic aneurysm.  She is scheduled for surgical management of this tomorrow by vascular surgery.   Objective: Vital signs in last 24 hours: Temp:  [97.5 F (36.4 C)-97.9 F (36.6 C)] 97.6 F (36.4 C) (12/28 0321) Pulse Rate:  [61-71] 67 (12/28 0321) Resp:  [16-20] 16 (12/28 0321) BP: (91-148)/(36-122) 114/48 (12/28 0321) SpO2:  [96 %-100 %] 99 % (12/28 0321)  Intake/Output from previous day: 12/27 0701 - 12/28 0700 In: 874.9 [P.O.:100; I.V.:371.1; IV Piggyback:403.8] Out: 400 [Urine:400] Intake/Output this shift: No intake/output data recorded.  Recent Labs    08/15/21 1605 08/15/21 2219 08/16/21 0304 08/17/21 0909 08/18/21 0521  HGB 9.6* 10.5* 9.1* 7.7* 7.4*   Recent Labs    08/17/21 0909 08/18/21 0521  WBC 4.4 3.6*  RBC 2.15* 2.08*  HCT 22.2* 21.3*  PLT 268 243   No results for input(s): NA, K, CL, CO2, BUN, CREATININE, GLUCOSE, CALCIUM in the last 72 hours. Recent Labs    08/15/21 1802  INR 1.3*    Physical Exam: Orthopedic examination again is limited to the lumbar spine and lower extremities.  Skin inspection of the patient's back remains unremarkable.  Her spine exhibits a mildly kyphotic deformity, but no swelling, erythema, ecchymosis, abrasions, or other skin abnormalities are observed.  There is only minimal tenderness to palpation along the mid and lower spine regions.  Specifically, she does not exhibit moderate focal tenderness over the T12 level.  She remains neurovascularly intact to both lower extremities, demonstrating the ability to dorsiflex and plantarflex her toes and ankles with good strength.  Sensation is intact to light touch over all distributions.  She has good  capillary refill to both feet.  Assessment: Significant T12 compression fracture of unclear age, presently stable symptomatically.  Plan: Once the patient has been cleared medically and vascularly, she may begin to be mobilized with physical therapy.  She may use a back extension brace for comfort if symptomatically necessary when sitting or standing.  She may continue to receive pain medication as felt to be medically appropriate.  Thank you for asking me to participate in the care of this most delightful yet unfortunate woman.  I will sign off at this time.  She can follow-up in our office in 1 month as needed for reevaluation.  If you have further need of orthopedic input during this hospitalization, please reconsult me.   Excell Seltzer Talia Hoheisel 08/17/21, 4:58 PM

## 2021-08-18 NOTE — Care Management Important Message (Signed)
Important Message  Patient Details  Name: Margaret Castillo MRN: [id number] Date of Birth: 12-05-1934   Medicare Important Message Given:  N/A - LOS <3 / Initial given by admissions     Johnell Comings 08/18/2021, 11:18 AM

## 2021-08-18 NOTE — Consult Note (Signed)
Consultation Note Date: 08/18/2021   Patient Name: Margaret Castillo  DOB: 1935/06/28  MRN: [id number]  Age / Sex: 86 y.o., female  PCP: Annett Gula, MD Referring Physician: Lorella Nimrod, MD  Reason for Consultation: Establishing goals of care  HPI/Patient Profile: 86 y.o. female  with past medical history of PVD, AAA (endovascular stent), hypothyroidism, CAD s/p MI, PMR, and HOH admitted on 08/15/2021 with nausea and vomiting.  Patient is a [facility] of [facility].  Patient found to have T12 fracture (age indeterminable) and, more concerning, a 9 cm abdominal aortic aneurysm.  Patient is scheduled for angiogram and possible embolization today.   Palliative medicine team was consulted to discuss goals of care.  Clinical Assessment and Goals of Care: I have reviewed medical records including EPIC notes, labs and imaging, assessed the patient and then met with patient, her grandson Margaret Castillo, and her daughter-in-law Margaret Castillo to discuss diagnosis prognosis, GOC, EOL wishes, disposition and options..  As I entered the room patient greets me with a smile and hello.  I attempted to introduce myself and she kept asking me "what? Who?".  She was pleasant but could not hear or understand what I was saying.  Shortly after I entered the room patient was transferred by nurse to procedure.  I introduced Palliative Medicine as specialized medical care for people living with serious illness. It focuses on providing relief from the symptoms and stress of a serious illness. The goal is to improve quality of life for both the patient and the family.  We discussed a brief life review of the patient.  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 Patient was married and has 2 sons.  1 son was tragically killed in a car accident earlier this year.  Her other son/next of kin is Margaret Castillo, who lives nearby,  works as an Mining engineer, and according to family at bedside (grandson and dtr. in law) is  intermittently involved in the patient's care.  I added his contact information to patient's chart.   As far as functional and nutritional status prior to this admission family endorses patient was not able to stand and walk on her own.  They endorse this decline in functional ability started in July of this year.  Family also endorses that patient will ask for food but when she receives it she only takes a few bites of any meal.  We discussed patient's current illness and what it means in the larger context of patient's on-going co-morbidities.  I attempted to elicit values and goals of care important to the patient. Advance directives, concepts specific to code status, and artificial feeding and hydration were considered and discussed.  Family shared that patient would likely want to allow a natural death and would not want to have a CODE BLUE called should she have a cardiopulmonary event.  Given that I had a brief time with the patient I was not clear on if she is able to make her own decisions or not.  Family seems to think she could participate and have some insight into what she would like her CODE STATUS to be.  I spoke with the patient's son Margaret Castillo over the telephone.  He was in agreement with family that the patient would likely want to be a DNR and allow a natural death.  No change to CODE STATUS at this time.  I will Castillo on the patient tomorrow and assuming it is appropriate to I will have a CODE STATUS conversation with her.   Discussed with patient/family the importance of continued conversation with family and the medical providers regarding overall plan of care and treatment options, ensuring decisions are within the context of the patients values and GOCs.    Questions and concerns were addressed. The family was encouraged to call with questions or concerns.   Primary Decision  Maker PATIENT  Code Status/Advance Care Planning: Full code  Prognosis:   Unable to determine  Discharge Planning: To Be Determined  Primary Diagnoses: Present on Admission:  Gastroenteritis due to COVID-19 virus   Physical Exam Vitals and nursing note reviewed.  Constitutional:      Appearance: Normal appearance.  HENT:     Head: Normocephalic and atraumatic.  Cardiovascular:     Rate and Rhythm: Normal rate.     Pulses: Normal pulses.  Pulmonary:     Effort: Pulmonary effort is normal.  Skin:    General: Skin is warm and dry.  Neurological:     Mental Status: She is alert. Mental status is at baseline.  Psychiatric:        Mood and Affect: Mood normal.        Behavior: Behavior normal.        Thought Content: Thought content normal.        Judgment: Judgment normal.    Vital Signs: BP (!) 144/61    Pulse 72    Temp 98.3 F (36.8 C) (Oral)    Resp 18    Ht 5' 5"  (1.651 m)    Wt 56.9 kg    SpO2 100%    BMI 20.87 kg/m  Pain Scale: PAINAD POSS *See Group Information*: 2-Acceptable,Slightly drowsy, easily aroused Pain Score: 0-No pain SpO2: SpO2: 100 % O2 Device:SpO2: 100 % O2 Flow Rate: .O2 Flow Rate (L/min): 4 L/min  Palliative Assessment/Data: 40%     I discussed this patient's plan of care with Dr. Reesa Chew, [facility] SW, patient, patient's daughter in law, grandson, and son.  Thank you for this consult. Palliative medicine will continue to follow and assist holistically.   Time Total: 70 minutes Greater than 50%  of this time was spent counseling and coordinating care related to the above assessment and plan.  Signed by: Jordan Hawks, DNP, FNP-BC Palliative Medicine    Please contact Palliative Medicine Team phone at [phone] for questions and concerns.  For individual provider: See Shea Evans

## 2021-08-18 NOTE — Progress Notes (Signed)
PT Cancellation Note  Patient Details Name: Margaret Castillo MRN: [id number] DOB: 1935/05/24   Cancelled Treatment:    Reason Eval/Treat Not Completed: PT screened, no needs identified, will sign off (Author returned to room to interview family for pt history.) Grandson and DIL in room. Both contribute to what is known about patient from medical record, confirm nonambulatory status at baseline due to severe persistent orthostatic syncope issues, report she remains in bed most days, occasionally gets up to chair with NSG at SNF. DIL also reports baseline large external hemorrhoid  which accounts for pt's complaint yesterday with Thereasa Parkin, feeling though she was sitting on something constantly. DIL says she has never been able to achieve compliance with donut pillow seat due to cognitive status. Family unaware of August outpatient note mentioning T12 compression fracture, but did mention a fall onto buttocks >1 years ago. Pt has not acute skilled PT needs at this time. Will defer to facility upon return.   1:11 PM, 08/18/21 Margaret Castillo, PT, DPT Physical Therapist - [facility]  [phone] (ASCOM)    Margaret Castillo 08/18/2021, 1:03 PM

## 2021-08-18 NOTE — TOC Progression Note (Signed)
Transition of Care [facility]) - Progression Note    Patient Details  Name: Margaret Castillo MRN: [id number] Date of Birth: November 10, 1934  [facility] CM/SW Contact  Chapman Fitch, RN Phone Number: 08/18/2021, 4:28 PM  Clinical Narrative:     Notified by PT that patient does not need STR for discharge. Plan for patient to return to LTC.  Stanton Kidney at [facility] notified that patient does NOT need auth       Expected Discharge Plan and Services                                                 Social Determinants of Health (SDOH) Interventions    Readmission Risk Interventions No flowsheet data found.

## 2021-08-18 NOTE — Progress Notes (Addendum)
PROGRESS NOTE    Margaret Castillo  QMG:[id number] DOB: Mar 12, 1935 DOA: 08/15/2021 PCP: [name], MD   Brief Narrative: Taken from H&P.  Margaret Castillo is a 86 y.o. female with a past medical history of hypothyroidism, hyperlipidemia, AAA status post endograft repair, carotid artery disease status post carotid enterectomy, presented to ED with complaints of nausea and vomiting.  Tested positive for COVID-19.  Chest x-ray without any acute abnormality.  CT abdomen and pelvis with acute or subacute T12 compression fracture and progressing infrarenal abdominal aortic aneurysm from 5.9 to 8.3 cm.  Vascular surgery was consulted and they ordered chest and abdominal CTA which shows a right lower lobe PE and progression of her previously treated AAA, there was also some concern of endoleak and involvement of perirenal arteries.  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  Because of the complexity of her disease they recommend transferring to [facility],. Tried and talked with Dr.Ruiz, vascular surgeon over there and after reviewing her films he does not think that she is an acute emergency and they were recommending outpatient follow-up with her on vascular surgeon at [facility] who treated her before,Dr Ealy.  Patient was started on heparin infusion for PE, remained on room air.  Discussed with vascular surgery she will be high risk for anticoagulation, they are advised to discuss with family and if they want to proceed they are recommending low-dose Eliquis.  Discussed with grandson today who will discuss with his mother who is her POA, patient does not have capacity to make any decision due to advance dementia.  Orthopedic surgery was also consulted for T12 compression fracture and they does not think that she is a candidate for kyphoplasty and are recommending conservative management and pain control.  We will continue current management until family comes to a  conclusion. Discontinuing aspirin to decrease her risk. Patient is vaccinated for COVID-19-started on remdesivir Patient is very high risk for deterioration and death-palliative care consult was placed.  12/27: Received message by vascular surgery, Dr. Wyn Quaker that he might be able to help her with embolization and coiling of her significantly enlarged AAA.  He is recommending continuation of heparin and he will talk with the family.  12/28: Patient is going for vascular procedure today.  Palliative care also started the discussion regarding goals of care.  She was apparently tested positive on 07/23/2021 at her facility, CT value of 33.9, we will discontinue isolation. Also found to have hemoglobin of 7.4, no obvious bleeding.  Giving 1 unit of PRBC.  Anemia panel with anemia of chronic disease, elevated B12 levels-discontinuing home supplement.  Subjective: Patient was seen and examined today.  She was resting comfortably in bed, she was asking for some water stating that my mouth is dry.  Waiting for her surgery.  No family at bedside  Assessment & Plan:   Principal Problem:   Gastroenteritis due to COVID-19 virus  Gastroenteritis due to COVID-19 virus.  Seems improving.  Procalcitonin negative, mildly elevated inflammatory markers.  Currently on room air.  It was found out today that she was tested positive at her facility on 07/23/2021.  CT value of 33.9.  We will discontinue isolation. -Discontinue remdesivir and steroid. -Continue with supportive care and supplements  AAA progression.  Patient is not a candidate for transfer as there is no emergent need.  See above note in summary.  Currently on heparin infusion. Vascular surgeon Dr. Wyn Quaker is going to take  her to the OR later today for embolization/coiling. Per Dr. Wyn Quaker she can be placed on anticoagulation. -Continue heparin infusion for now -Aspirin was discontinued to decrease the risk of bleeding -Palliative care consult-appreciate their  help.  Right lower lobe pulmonary embolism.  Low concern of right heart strain on images.  Remained on room air. -Continue with heparin infusion for now-she can be switched to NOAC from tomorrow after the procedure.  Elevated troponin.  Most likely secondary to demand ischemia, flat curve and no chest pain.  T12 compression fracture.  No need for kyphoplasty per orthopedic surgery and they are recommending conservative management. -Continue with pain control as needed  Hypothyroidism.  -Continue with home Synthroid  CAD/hyperlipidemia.  Patient was on aspirin and statin at home. -Continue Lipitor -Holding aspirin as patient is on anticoagulation.  Iron deficiency anemia.  Hemoglobin trending down, at 7.4 today.  No obvious bleeding.  Repeat anemia panel with anemia of chronic disease.  Markedly elevated B12 levels. -Give her 1 unit of PRBC -Continue home iron supplement -Discontinue home B12 supplement  Insomnia. -Continue home Remeron and melatonin   Objective: Vitals:   08/18/21 1505 08/18/21 1510 08/18/21 1516 08/18/21 1521  BP:      Pulse:      Resp:      Temp:      TempSrc:      SpO2: 95% 100% 100% 100%  Weight:      Height:        Intake/Output Summary (Last 24 hours) at 08/18/2021 1522 Last data filed at 08/18/2021 0320 Gross per 24 hour  Intake 537.54 ml  Output 400 ml  Net 137.54 ml    Filed Weights   08/15/21 0841 08/15/21 1013  Weight: 61.2 kg 56.9 kg    Examination:  General.  Frail elderly lady, in no acute distress. Pulmonary.  Lungs clear bilaterally, normal respiratory effort. CV.  Regular rate and rhythm, no JVD, rub or murmur. Abdomen.  Soft, nontender, nondistended, BS positive. CNS.  Alert and oriented .  No focal neurologic deficit. Extremities.  No edema, no cyanosis, pulses intact and symmetrical. Psychiatry.  Judgment and insight appears normal.   DVT prophylaxis: Heparin infusion Code Status: Full Family Communication:  Palliative care discussion with daughter-in-law and grandson. Disposition Plan:  Status is: Inpatient  Remains inpatient appropriate because: Severity of illness  Level of care: Med-Surg  All the records are reviewed and case discussed with Care Management/Social Worker. Management plans discussed with the patient, nursing and they are in agreement.  Consultants:  Orthopedic surgery Vascular surgery  Procedures:  Antimicrobials:   Data Reviewed: I have personally reviewed following labs and imaging studies  CBC: Recent Labs  Lab 08/15/21 0359 08/15/21 1605 08/15/21 2219 08/16/21 0304 08/17/21 0909 08/18/21 0521  WBC 5.2  --   --  4.4 4.4 3.6*  NEUTROABS 3.8  --   --   --   --   --   HGB 10.9* 9.6* 10.5* 9.1* 7.7* 7.4*  HCT 32.0* 28.0* 30.3* 26.6* 22.2* 21.3*  MCV 102.6*  --   --  104.3* 103.3* 102.4*  PLT 397  --   --  280 268 243    Basic Metabolic Panel: Recent Labs  Lab 08/15/21 0359  NA 136  K 3.6  CL 104  CO2 24  GLUCOSE 142*  BUN 12  CREATININE 0.63  CALCIUM 8.2*    GFR: Estimated Creatinine Clearance: 47 mL/min (by C-G formula based on SCr of 0.63 mg/dL). Liver  Function Tests: Recent Labs  Lab 08/15/21 0359  AST 43*  ALT 14  ALKPHOS 97  BILITOT 1.3*  PROT 6.3*  ALBUMIN 2.7*    Recent Labs  Lab 08/15/21 0359  LIPASE 30    No results for input(s): AMMONIA in the last 168 hours. Coagulation Profile: Recent Labs  Lab 08/15/21 1802  INR 1.3*    Cardiac Enzymes: No results for input(s): CKTOTAL, CKMB, CKMBINDEX, TROPONINI in the last 168 hours. BNP (last 3 results) No results for input(s): PROBNP in the last 8760 hours. HbA1C: No results for input(s): HGBA1C in the last 72 hours. CBG: No results for input(s): GLUCAP in the last 168 hours. Lipid Profile: No results for input(s): CHOL, HDL, LDLCALC, TRIG, CHOLHDL, LDLDIRECT in the last 72 hours. Thyroid Function Tests: No results for input(s): TSH, T4TOTAL, FREET4, T3FREE,  THYROIDAB in the last 72 hours. Anemia Panel: Recent Labs    08/15/21 1605 08/17/21 1711 08/18/21 0521 08/18/21 1025  VITAMINB12  --  3,686*  --   --   FOLATE  --   --   --  6.7  FERRITIN  --   --   --  1,045*  TIBC 133*  --   --  125*  IRON 67  --   --  102  RETICCTPCT  --   --  1.6  --     Sepsis Labs: Recent Labs  Lab 08/15/21 0359 08/15/21 0717  PROCALCITON  --  <0.10  LATICACIDVEN 1.7 1.7     Recent Results (from the past 240 hour(s))  Resp Panel by RT-PCR (Flu A&B, Covid) Nasopharyngeal Swab     Status: Abnormal   Collection Time: 08/15/21  4:00 AM   Specimen: Nasopharyngeal Swab; Nasopharyngeal(NP) swabs in vial transport medium  Result Value Ref Range Status   SARS Coronavirus 2 by RT PCR POSITIVE (A) NEGATIVE Final    Comment: (NOTE) SARS-CoV-2 target nucleic acids are DETECTED.  The SARS-CoV-2 RNA is generally detectable in upper respiratory specimens during the acute phase of infection. Positive results are indicative of the presence of the identified virus, but do not rule out bacterial infection or co-infection with other pathogens not detected by the test. Clinical correlation with patient history and other diagnostic information is necessary to determine patient infection status. The expected result is Negative.  Fact Sheet for Patients: BloggerCourse.com  Fact Sheet for Healthcare Providers: SeriousBroker.it  This test is not yet approved or cleared by the [place] FDA and  has been authorized for detection and/or diagnosis of SARS-CoV-2 by FDA under an Emergency Use Authorization (EUA).  This EUA will remain in effect (meaning this test can be used) for the duration of  the COVID-19 declaration under Section 564(b)(1) of the A ct, 21 U.S.C. section 360bbb-3(b)(1), unless the authorization is terminated or revoked sooner.     Influenza A by PCR NEGATIVE NEGATIVE Final   Influenza B by PCR  NEGATIVE NEGATIVE Final    Comment: (NOTE) The Xpert Xpress SARS-CoV-2/FLU/RSV plus assay is intended as an aid in the diagnosis of influenza from Nasopharyngeal swab specimens and should not be used as a sole basis for treatment. Nasal washings and aspirates are unacceptable for Xpert Xpress SARS-CoV-2/FLU/RSV testing.  Fact Sheet for Patients: BloggerCourse.com  Fact Sheet for Healthcare Providers: SeriousBroker.it  This test is not yet approved or cleared by the [place] FDA and has been authorized for detection and/or diagnosis of SARS-CoV-2 by FDA under an Emergency Use Authorization (EUA). This EUA will  remain in effect (meaning this test can be used) for the duration of the COVID-19 declaration under Section 564(b)(1) of the Act, 21 U.S.C. section 360bbb-3(b)(1), unless the authorization is terminated or revoked.  Performed at [facility], [address] [place], [place] [address]       Radiology Studies: No results found.  Scheduled Meds:  [MAR Hold] atorvastatin  10 mg Oral QHS   [MAR Hold] cholecalciferol  1,000 Units Oral Daily   [MAR Hold] conjugated estrogens  0.5 g Vaginal Once per day on Mon Thu   fentaNYL       [MAR Hold] ferrous sulfate  324 mg Oral QODAY   [MAR Hold] levothyroxine  75 mcg Oral Daily   midazolam       [MAR Hold] mirtazapine  15 mg Oral QHS   [MAR Hold] multivitamin with minerals  1 tablet Oral Daily   [MAR Hold] pantoprazole (PROTONIX) IV  40 mg Intravenous Q12H   [MAR Hold] predniSONE  50 mg Oral Daily   [MAR Hold] sodium chloride flush  10-40 mL Intracatheter Q12H   Continuous Infusions:  sodium chloride 20 mL/hr at 08/18/21 1423   heparin Stopped (08/18/21 1409)   lactated ringers 75 mL/hr at 08/18/21 0320   [MAR Hold] promethazine (PHENERGAN) injection (IM or IVPB) Stopped (08/15/21 1222)   [MAR Hold] remdesivir 100 mg in NS 100 mL 100 mg (08/18/21 0942)    vancomycin       LOS: 3 days   Time spent: 38 minutes. More than 50% of the time was spent in counseling/coordination of care  Arnetha Courser, MD [facility]  If 7PM-7AM, please contact night-coverage [email or web address]  08/18/2021, 3:22 PM   This record has been created using Conservation officer, historic buildings. Errors have been sought and corrected,but may not always be located. Such creation errors do not reflect on the standard of care.

## 2021-08-19 ENCOUNTER — Encounter: Payer: Self-pay | Admitting: Vascular Surgery

## 2021-08-19 DIAGNOSIS — U071 COVID-19: Secondary | ICD-10-CM | POA: Diagnosis not present

## 2021-08-19 DIAGNOSIS — A0839 Other viral enteritis: Secondary | ICD-10-CM | POA: Diagnosis not present

## 2021-08-19 DIAGNOSIS — Z515 Encounter for palliative care: Secondary | ICD-10-CM | POA: Diagnosis not present

## 2021-08-19 DIAGNOSIS — L8990 Pressure ulcer of unspecified site, unspecified stage: Secondary | ICD-10-CM | POA: Insufficient documentation

## 2021-08-19 DIAGNOSIS — Z66 Do not resuscitate: Secondary | ICD-10-CM

## 2021-08-19 DIAGNOSIS — R112 Nausea with vomiting, unspecified: Secondary | ICD-10-CM | POA: Diagnosis not present

## 2021-08-19 LAB — CBC
HCT: 28.4 % — ABNORMAL LOW (ref 36.0–46.0)
Hemoglobin: 9.9 g/dL — ABNORMAL LOW (ref 12.0–15.0)
MCH: 34.5 pg — ABNORMAL HIGH (ref 26.0–34.0)
MCHC: 34.9 g/dL (ref 30.0–36.0)
MCV: 99 fL (ref 80.0–100.0)
Platelets: 226 10*3/uL (ref 150–400)
RBC: 2.87 MIL/uL — ABNORMAL LOW (ref 3.87–5.11)
RDW: 18.6 % — ABNORMAL HIGH (ref 11.5–15.5)
WBC: 5.7 10*3/uL (ref 4.0–10.5)
nRBC: 0.3 % — ABNORMAL HIGH (ref 0.0–0.2)

## 2021-08-19 LAB — BPAM RBC
Blood Product Expiration Date: 202301202359
ISSUE DATE / TIME: 202212281120
Unit Type and Rh: 6200

## 2021-08-19 LAB — TYPE AND SCREEN
ABO/RH(D): A POS
Antibody Screen: NEGATIVE
Unit division: 0

## 2021-08-19 LAB — HEPARIN LEVEL (UNFRACTIONATED): Heparin Unfractionated: 0.66 IU/mL (ref 0.30–0.70)

## 2021-08-19 MED ORDER — APIXABAN 2.5 MG PO TABS: 2.5000 mg | ORAL_TABLET | Freq: Two times a day (BID) | ORAL | Status: AC

## 2021-08-19 MED ORDER — APIXABAN 2.5 MG PO TABS
2.5000 mg | ORAL_TABLET | Freq: Two times a day (BID) | ORAL | Status: DC
  Administered 2021-08-19: 11:00:00 2.5 mg via ORAL
  Filled 2021-08-19: qty 1

## 2021-08-19 NOTE — Plan of Care (Signed)
°  Problem: Clinical Measurements: Goal: Ability to maintain clinical measurements within normal limits will improve Outcome: Progressing Goal: Will remain free from infection Outcome: Progressing Goal: Diagnostic test results will improve Outcome: Progressing Goal: Respiratory complications will improve Outcome: Progressing Goal: Cardiovascular complication will be avoided Outcome: Progressing   Problem: Pain Managment: Goal: General experience of comfort will improve Outcome: Progressing  V/S stable. No complaints of pain. Bil Starclose closure device kept in place on both groin.

## 2021-08-19 NOTE — TOC Transition Note (Signed)
Transition of Care [facility]) - CM/SW Discharge Note   Patient Details  Name: Margaret Castillo MRN: [id number] Date of Birth: 12/10/1934  Transition of Care [facility]) CM/SW Contact:  Chapman Fitch, RN Phone Number: 08/19/2021, 3:28 PM   Clinical Narrative:    Patient will DC to:[facility] Anticipated DC date:08/19/21  Family notified: Son Transport by:EMS  Per MD patient ready for DC to . RN, patient, patient's family, and facility notified of DC. Discharge Summary sent to facility. RN given number for report. DC packet on chart. Ambulance transport requested for patient.  TOC signing off.  [name] [facility] [phone]          Patient Goals and CMS Choice        Discharge Placement                       Discharge Plan and Services                                     Social Determinants of Health (SDOH) Interventions     Readmission Risk Interventions No flowsheet data found.

## 2021-08-19 NOTE — Progress Notes (Signed)
ANTICOAGULATION CONSULT NOTE  Pharmacy Consult for Eliquis Indication: pulmonary embolus  Allergies  Allergen Reactions   Tape Other (See Comments)    Skin irritation   Atorvastatin Other (See Comments)    MUSCLE PAIN (all statins)   Codeine Nausea Only   Crestor [Rosuvastatin] Other (See Comments)    Leg pain/ joint pain  ( ALL THE STATIN's )   Penicillins Rash    Has patient had a PCN reaction causing immediate rash, facial/tongue/throat swelling, SOB or lightheadedness with hypotension: Yes Has patient had a PCN reaction causing severe rash involving mucus membranes or skin necrosis: No Has patient had a PCN reaction that required hospitalization No Has patient had a PCN reaction occurring within the last 10 years: No If all of the above answers are "NO", then may proceed with Cephalosporin use.    Cephalexin Rash    Patient Measurements: Height: 5\' 5"  (165.1 cm) Weight: 56.9 kg (125 lb 7.1 oz) IBW/kg (Calculated) : 57 kg   Vital Signs: Temp: 97.6 F (36.4 C) (12/29 0804) Temp Source: Oral (12/29 0804) BP: 91/43 (12/29 0804) Pulse Rate: 66 (12/29 0804)  Labs: Recent Labs    08/17/21 0909 08/17/21 1711 08/18/21 0521 08/19/21 0440  HGB 7.7*  --  7.4* 9.9*  HCT 22.2*  --  21.3* 28.4*  PLT 268  --  243 226  HEPARINUNFRC 0.60 0.37 0.43 0.66     Estimated Creatinine Clearance: 47 mL/min (by C-G formula based on SCr of 0.63 mg/dL).   Medical History: Past Medical History:  Diagnosis Date   AAA (abdominal aortic aneurysm)    Anemia    Arthritis    Neck   Bladder prolapse, female, acquired    Carotid artery occlusion    Endoleak post endovascular aneurysm repair [facility]), Type I 02/2016   [facility] for aortic cuff placement   Hearing aid worn    Hypothyroidism    Myocardial infarction [facility]) 1982   Polymyalgia rheumatica (HCC)    Prolapsed internal hemorrhoids, grade 3    Shingles    Uterine prolapse     Medications:  Per chart review, no  anticoagulation noted prior to admission  Assessment: 86 y.o. female with a past medical history of hypothyroidism, hyperlipidemia, AAA status post endograft repair, carotid artery disease status post carotid enterectomy. Pharmacy has been consulted for transitioning pt from heparin to Eliquis.  Goal of Therapy:  Monitor platelets by anticoagulation protocol: Yes   Plan:  Discontinue heparin drip and start Eliquis 2.5 mg BID (no loading dose and pt to start on low dose Eliquis per MD) CBC per protocol  97, PharmD Clinical Pharmacist  08/19/2021 10:17 AM

## 2021-08-19 NOTE — Progress Notes (Signed)
Gave report to Natalia, LPN from [facility]. Patient left via PTAR

## 2021-08-19 NOTE — Progress Notes (Addendum)
ANTICOAGULATION CONSULT NOTE  Pharmacy Consult for heparin infusion Indication: pulmonary embolus  Allergies  Allergen Reactions   Tape Other (See Comments)    Skin irritation   Atorvastatin Other (See Comments)    MUSCLE PAIN (all statins)   Codeine Nausea Only   Crestor [Rosuvastatin] Other (See Comments)    Leg pain/ joint pain  ( ALL THE STATIN's )   Penicillins Rash    Has patient had a PCN reaction causing immediate rash, facial/tongue/throat swelling, SOB or lightheadedness with hypotension: Yes Has patient had a PCN reaction causing severe rash involving mucus membranes or skin necrosis: No Has patient had a PCN reaction that required hospitalization No Has patient had a PCN reaction occurring within the last 10 years: No If all of the above answers are "NO", then may proceed with Cephalosporin use.    Cephalexin Rash    Patient Measurements: Height: 5\' 5"  (165.1 cm) Weight: 56.9 kg (125 lb 7.1 oz) IBW/kg (Calculated) : 57 kg   Vital Signs: Temp: 97.7 F (36.5 C) (12/29 0428) Temp Source: Oral (12/28 2103) BP: 103/81 (12/29 0428) Pulse Rate: 72 (12/29 0428)  Labs: Recent Labs    08/17/21 0909 08/17/21 1711 08/18/21 0521 08/19/21 0440  HGB 7.7*  --  7.4* 9.9*  HCT 22.2*  --  21.3* 28.4*  PLT 268  --  243 226  HEPARINUNFRC 0.60 0.37 0.43 0.66     Estimated Creatinine Clearance: 47 mL/min (by C-G formula based on SCr of 0.63 mg/dL).   Medical History: Past Medical History:  Diagnosis Date   AAA (abdominal aortic aneurysm)    Anemia    Arthritis    Neck   Bladder prolapse, female, acquired    Carotid artery occlusion    Endoleak post endovascular aneurysm repair [facility]), Type I 02/2016   [facility] for aortic cuff placement   Hearing aid worn    Hypothyroidism    Myocardial infarction [facility]) 1982   Polymyalgia rheumatica (HCC)    Prolapsed internal hemorrhoids, grade 3    Shingles    Uterine prolapse     Medications:  Per chart  review, no anticoagulation noted prior to admission  Assessment: 86 y.o. female with a past medical history of hypothyroidism, hyperlipidemia, AAA status post endograft repair, carotid artery disease status post carotid enterectomy. Pharmacy has been consulted for heparin.   Goal of Therapy:  Heparin level 0.3-0.7 units/ml Monitor platelets by anticoagulation protocol: Yes   Plan:  12/29: HL @ 0521 = 0.66, therapeutic x 4 Will continue heparin infusion at 550 units/hr.  Will check HL daily with AM labs CBC daily while on IV heparin   1/30, PharmD, [facility] 08/19/2021 6:55 AM

## 2021-08-19 NOTE — Progress Notes (Signed)
°                                                   °Palliative Care Progress Note, Assessment & Plan  ° °Patient Name: Margaret Castillo       Date: 08/19/2021 °DOB: 07/02/1935  Age: 86 y.o. MRN#: [id number] °Attending Physician: Amin, Sumayya, MD °Primary Care Physician: Larson, Brittany Marie, MD °Admit Date: 08/15/2021 ° °Reason for Consultation/Follow-up: Establishing goals of care ° °Subjective: °Patient is sitting in bed and speaking with pharmacy regarding her Eliquis.  She is in no apparent distress, acknowledges my entrance with a waving hello, and asks me if she is doing okay.  She has a smile and makes little giggle.  No other family is at bedside. ° °HPI: °86 y.o. female  with past medical history of PVD, AAA (endovascular stent), hypothyroidism, CAD s/p MI, PMR, and HOH admitted on 08/15/2021 with nausea and vomiting.  Patient is a long-term resident of [facility].  Patient found to have T12 fracture (age indeterminable) and, more concerning, a 9 cm abdominal aortic aneurysm. ° °12/28 patient underwent angiogram with coil embolization without complication. ° °Plan of Care: °I have reviewed medical records including EPIC notes, labs and imaging, assessed the patient and then met with patient at bedside to discuss diagnosis prognosis, GOC, EOL wishes, disposition, and options. ° °I introduced Palliative Medicine as specialized medical care for people living with serious illness. It focuses on providing relief from the symptoms and stress of a serious illness. The goal is to improve quality of life for both the patient and the family. ° °I shared with the patient that I had met her briefly yesterday and remained in her room to speak with her daughter-in-law and grandson yesterday.  She smiled and asked if she had had her procedure.  I shared that yes she had had the  procedure to help improve her abdominal aortic aneurysm.  She asked how she did and I said she Balick outside tolerated the procedure well.  She smiled and then asked when did she get to leave and where she going. ° °I shared that I had had a discussion with her family members regarding her CODE STATUS.  CPR, full CODE STATUS, resuscitation with epinephrine and shock described in detail.  Use of breathing tube and ventilator also detailed.   ° °Patient shared she would not want all of this done.  She said she would never want to be left somewhere on a machine in a vegetative state.  Allowing a natural death and a DO NOT RESUSCITATE order described in detail.  Patient agreed that she would want this to be her CODE STATUS.  ° °Code status changed to DNR. ° °Patient complained of dry mouth and asked if she could have some ice chips.  Nursing notified. ° °After speaking with the patient I spoke with her grandson Hayden to convey the patient's wishes to have a DNR in place.  I also shared that palliative outpatient services are available to be an extra layer support to the patient and her family in the community.  Grandson confirmed he would like this referral. ° °TOC referral placed for outpatient palliative services for patient at discharge. ° °Questions and concerns were addressed. The family was encouraged to call with questions or concerns.  ° °Care plan was   discussed with patient, patient's grandson ° °Code Status: °DNR ° °Prognosis: ° Unable to determine ° °Discharge Planning: °Skilled Nursing Facility for rehab with Palliative care service follow-up ° °Recommendations/Plan: °DNR °Treat the treatable ° °Physical Exam °Vitals and nursing note reviewed.  °Constitutional:   °   Appearance: Normal appearance.  °HENT:  °   Head: Normocephalic and atraumatic.  °   Ears:  °   Comments: HOH °   Mouth/Throat:  °   Comments: Poor dentation °Eyes:  °   Pupils: Pupils are equal, round, and reactive to light.  °Cardiovascular:   °   Rate and Rhythm: Normal rate.  °   Pulses: Normal pulses.  °Pulmonary:  °   Effort: Pulmonary effort is normal.  °Abdominal:  °   Palpations: Abdomen is soft.  °Musculoskeletal:  °   Comments: Limited mobility  °Skin: °   General: Skin is warm and dry.  °Neurological:  °   Mental Status: She is alert. Mental status is at baseline.  °Psychiatric:     °   Mood and Affect: Mood normal.     °   Behavior: Behavior normal.     °   Thought Content: Thought content normal.     °   Judgment: Judgment normal.  °         °     °Palliative Assessment/Data: 50% ° ° ° °  ° °Total Time 25 minutes  °Greater than 50%  of this time was spent counseling and coordinating care related to the above assessment and plan. ° °Thank you for allowing the Palliative Medicine Team to assist in the care of this patient. ° ° L. , DNP, FNP-BC °Palliative Medicine Team °Team Phone # [phone] °  °

## 2021-08-19 NOTE — Progress Notes (Signed)
[facility]  Daily Progress Note   Subjective  -   Doing well. No complaints. No events overnight  Objective Vitals:   08/18/21 1900 08/18/21 2103 08/19/21 0428 08/19/21 0804  BP: 130/80 (!) 119/51 103/81 (!) 91/43  Pulse:  69 72 66  Resp: 18 18 16 18   Temp:  (!) 97.3 F (36.3 C) 97.7 F (36.5 C) 97.6 F (36.4 C)  TempSrc:  Oral  Oral  SpO2: 98% 98% 100% 99%  Weight:      Height:        Intake/Output Summary (Last 24 hours) at 08/19/2021 1231 Last data filed at 08/19/2021 0500 Gross per 24 hour  Intake 1916.1 ml  Output 700 ml  Net 1216.1 ml    PULM  CTAB CV  RRR VASC  Access sites C/D/I  Laboratory CBC    Component Value Date/Time   WBC 5.7 08/19/2021 0440   HGB 9.9 (L) 08/19/2021 0440   HCT 28.4 (L) 08/19/2021 0440   PLT 226 08/19/2021 0440    BMET    Component Value Date/Time   NA 136 08/15/2021 0359   K 3.6 08/15/2021 0359   CL 104 08/15/2021 0359   CO2 24 08/15/2021 0359   GLUCOSE 142 (H) 08/15/2021 0359   BUN 12 08/15/2021 0359   CREATININE 0.63 08/15/2021 0359   CALCIUM 8.2 (L) 08/15/2021 0359   GFRNONAA >60 08/15/2021 0359   GFRAA >60 03/23/2018 0319    Assessment/Planning: POD #1 s/p endoleak coil embolization  Bilateral groin sites look good No restrictions Would plan to see her in the office in 3-4 weeks with EVAR duplex   05/23/2018  08/19/2021, 12:31 PM

## 2021-08-19 NOTE — Discharge Summary (Signed)
Physician Discharge Summary  ATHZIRY MILLICAN BJY:[id number] DOB: 06-19-1935 DOA: 08/15/2021  PCP: [name], MD  Admit date: 08/15/2021 Discharge date: 08/19/2021  Admitted From: SNF Disposition: SNF  Recommendations for Outpatient Follow-up:  Follow up with PCP in 1-2 weeks Follow-up with vascular surgery Follow-up with orthopedic surgery in 1 month Please obtain BMP/CBC in one week Please follow up on the following pending results: None  Home Health: No Equipment/Devices: Rolling walker Discharge Condition: Stable CODE STATUS: Full Diet recommendation: Heart Healthy   Brief/Interim Summary: Margaret Castillo is a 86 y.o. female with a past medical history of hypothyroidism, hyperlipidemia, AAA status post endograft repair, carotid artery disease status post carotid enterectomy, presented to ED with complaints of nausea and vomiting.  Tested positive for COVID-19.  Chest x-ray without any acute abnormality.  CT abdomen and pelvis with acute or subacute T12 compression fracture and progressing infrarenal abdominal aortic aneurysm from 5.9 to 8.3 cm.  Vascular surgery was consulted and they ordered chest and abdominal CTA which shows a right lower lobe PE and progression of her previously treated AAA, there was also some concern of endoleak and involvement of perirenal arteries.  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  Because of the complexity of her disease they recommend transferring to [facility],. Tried and talked with Dr.Ruiz, vascular surgeon over there and after reviewing her films he does not think that she is an acute emergency and they were recommending outpatient follow-up with her on vascular surgeon at [facility] who treated her before,Dr Ealy.  Our vascular surgeon Dr. Wyn Quaker was able to take her to the OR on 08/18/2021 and have a successful embolization and coiling of the arteries feeding to aneurysm.  She will follow-up with him as an  outpatient for further recommendations.  For PE she received Heparin most of the duration of hospitalization and transition to low-dose Eliquis as recommended by vascular surgery.  PE is most likely a sequelae of her recent COVID infection.  She will need at least 3 months of anticoagulation.  We also discontinue home dose of aspirin in order to decrease her risk of bleeding with Eliquis.  PCP can resume if needed.  Patient was tested positive for COVID.  She did received 3 doses of remdesivir.  Remained on room air.  On 08/18/2021 a family member mentioned that she was tested positive on 07/23/2021 at our facility.  Isolation was discontinued at that time.  No further treatment needed.  She was also found to have hemoglobin of 7.4, no obvious bleeding.  Received 1 unit of PRBC.  Hemoglobin at 9.9 on the day of discharge.  Anemia panel with anemia of chronic disease and elevated B12 levels, we discontinue home B12 supplement.  She can continue her iron supplement and follow-up with her primary care provider for further recommendations.  She was also evaluated by orthopedic surgery for her T12 compression fracture, no need for kyphoplasty and she can follow-up with them as an outpatient in 1 month for further recommendations.  She will continue with rest of her home medications and follow-up with her providers.  Discharge Diagnoses:  Principal Problem:   Gastroenteritis due to COVID-19 virus Active Problems:   Pressure injury of skin   Discharge Instructions  Discharge Instructions     Diet - low sodium heart healthy   Complete by: As directed    Increase activity slowly   Complete by: As directed    No dressing  needed   Complete by: As directed       Allergies as of 08/19/2021       Reactions   Tape Other (See Comments)   Skin irritation   Atorvastatin Other (See Comments)   MUSCLE PAIN (all statins)   Codeine Nausea Only   Crestor [rosuvastatin] Other (See Comments)   Leg  pain/ joint pain  ( ALL THE STATIN's )   Penicillins Rash   Has patient had a PCN reaction causing immediate rash, facial/tongue/throat swelling, SOB or lightheadedness with hypotension: Yes Has patient had a PCN reaction causing severe rash involving mucus membranes or skin necrosis: No Has patient had a PCN reaction that required hospitalization No Has patient had a PCN reaction occurring within the last 10 years: No If all of the above answers are "NO", then may proceed with Cephalosporin use.   Cephalexin Rash        Medication List     STOP taking these medications    aspirin 81 MG chewable tablet   cyanocobalamin 1000 MCG tablet       TAKE these medications    acetaminophen 500 MG tablet Commonly known as: TYLENOL Take 1,000 mg by mouth every 8 (eight) hours as needed for mild pain or moderate pain.   apixaban 2.5 MG Tabs tablet Commonly known as: ELIQUIS Take 1 tablet (2.5 mg total) by mouth 2 (two) times daily.   atorvastatin 10 MG tablet Commonly known as: LIPITOR Take 10 mg by mouth at bedtime.   Cholecalciferol 25 MCG (1000 UT) tablet Take 1,000 Units by mouth daily.   conjugated estrogens vaginal cream Commonly known as: PREMARIN Place 0.5 g vaginally 2 (two) times a week. Applies twice a week at bedtime.   ferrous sulfate 324 MG Tbec Take 324 mg by mouth every other day.   levothyroxine 75 MCG tablet Commonly known as: SYNTHROID Take 75 mcg by mouth daily.   melatonin 3 MG Tabs tablet Take 3 mg by mouth at bedtime as needed (sleep).   mirtazapine 15 MG tablet Commonly known as: REMERON Take 15 mg by mouth at bedtime.   multivitamin tablet Take 1 tablet by mouth daily.   polyethylene glycol 17 g packet Commonly known as: MIRALAX / GLYCOLAX Take 17 g by mouth daily as needed for mild constipation. What changed: reasons to take this               Discharge Care Instructions  (From admission, onward)           Start      Ordered   08/19/21 0000  No dressing needed        08/19/21 1213            Follow-up Information     [name], MD. Schedule an appointment as soon as possible for a visit in 1 week(s).   Specialty: Family Medicine Contact information: [address] [facility] [place] [address] [phone]         Annice Needy, MD Follow up in 1 week(s).   Specialties: Vascular Surgery, Radiology, Interventional Cardiology Contact information: [address] [place] [address] [phone]                Allergies  Allergen Reactions   Tape Other (See Comments)    Skin irritation   Atorvastatin Other (See Comments)    MUSCLE PAIN (all statins)   Codeine Nausea Only   Crestor [Rosuvastatin] Other (See  Comments)    Leg pain/ joint pain  ( ALL THE STATIN's )   Penicillins Rash    Has patient had a PCN reaction causing immediate rash, facial/tongue/throat swelling, SOB or lightheadedness with hypotension: Yes Has patient had a PCN reaction causing severe rash involving mucus membranes or skin necrosis: No Has patient had a PCN reaction that required hospitalization No Has patient had a PCN reaction occurring within the last 10 years: No If all of the above answers are "NO", then may proceed with Cephalosporin use.    Cephalexin Rash    Consultations: Vascular surgery Orthopedic surgery  Procedures/Studies: CT Abdomen Pelvis Wo Contrast  Result Date: 08/15/2021 CLINICAL DATA:  86 year old female with vomiting and rectal pain. Abdominal aortic aneurysm treated with endograft. EXAM: CT ABDOMEN AND PELVIS WITHOUT CONTRAST TECHNIQUE: Multidetector CT imaging of the abdomen and pelvis was performed following the standard protocol without IV contrast. COMPARISON:  CTA Abdomen and Pelvis 12/21/2016. FINDINGS: Lower chest: Extensive calcified coronary artery atherosclerosis. Partially visible tortuous thoracic aorta with calcified plaque. No  pericardial effusion. Stable borderline cardiomegaly. Stable mild lung base scarring. No pleural effusion. Hepatobiliary: Chronically absent gallbladder. Negative noncontrast liver. Pancreas: Atrophy. Spleen: Negative. Adrenals/Urinary Tract: Negative adrenal glands. Nonobstructed kidneys. Nephrolithiasis versus renal vascular calcification. Diminutive ureters and bladder. Incidental pelvic phleboliths. Stomach/Bowel: Redundant large bowel with intermittent retained stool increased compared to 2018. No large bowel inflammation identified. Normal appendix on coronal image 37. Decompressed terminal ileum. No dilated small bowel. Decompressed stomach and duodenum. Small volume of simple density fluid in the stomach and the duodenum. No free air. No free fluid. Vascular/Lymphatic: Extensive Aortoiliac calcified atherosclerosis. Chronic bifurcated abdominal aortic endograft. However, the underlying infrarenal abdominal aortic aneurysm has substantially progressed since 2018, now up to 83 mm maximum transverse diameter (series 2, image 48) versus 59 mm in 2018. Aneurysm length of 90 mm has also increased from 82 mm in 2018. But there is no Peri aortic hematoma or inflammatory stranding identified. Vascular patency is not evaluated in the absence of IV contrast. No lymphadenopathy. Reproductive: Negative. Other: No pelvic free fluid. Musculoskeletal: Severe T12 compression fracture with near vertebra plana (series 6, image 66) is new since 2018, and appears unhealed with associated AVN, vacuum phenomena. Minimal retropulsion of bone with no associated spinal stenosis. Osteopenia. Other visualized osseous structures appears stable. IMPRESSION: 1. Substantially progressed infrarenal abdominal aortic aneurysm since 2018 despite endograft. Native aneurysm sac now up to 8.3 cm maximum transverse diameter versus 5.9 cm in 2018. No para aortic hematoma or inflammatory stranding. Recommend Vascular Surgery consultation. This  recommendation follows ACR consensus guidelines: White Paper of the ACR Incidental Findings Committee II on Vascular Findings. J Am Coll Radiol 2013; 10:789-794. Aortic Atherosclerosis (ICD10-I70.0). Aortic aneurysm NOS (ICD10-I71.9). 2. Severe T12 compression fracture with near vertebra plana is new since 2018, and appears acute or subacute with associated AVN. No significant retropulsion or other complicating features. 3. No other acute or inflammatory process identified in the non-contrast abdomen or pelvis. Electronically Signed   By: Odessa Fleming M.D.   On: 08/15/2021 04:51   PERIPHERAL VASCULAR CATHETERIZATION  Result Date: 08/18/2021 See surgical note for result.  DG Chest Port 1 View  Result Date: 08/15/2021 CLINICAL DATA:  86 year old female with vomiting. EXAM: PORTABLE CHEST 1 VIEW COMPARISON:  Chest radiographs 06/23/2017 and earlier. FINDINGS: Portable AP upright view at 0419 hours. Calcified aortic atherosclerosis. Right Common carotid area vascular stent appears to be new since 2018. Other mediastinal contours are within normal limits.  Visualized tracheal air column is within normal limits. Chronic increased reticular opacity in the right upper lobe appears stable since 2018. Otherwise Allowing for portable technique the lungs are clear. No pneumothorax. Negative visible bowel gas. Stable cholecystectomy clips. Partially visible chronic abdominal aortic endograft. IMPRESSION: No acute cardiopulmonary abnormality. Aortic Atherosclerosis (ICD10-I70.0). Right common carotid vascular stent appears to be new since 2018. Chronic abdominal aortic endograft. Electronically Signed   By: Odessa Fleming M.D.   On: 08/15/2021 04:31   CT Angio Chest/Abd/Pel for Dissection W and/or W/WO  Result Date: 08/15/2021 CLINICAL DATA:  Abdominal aortic aneurysm, nausea, vomiting EXAM: CT ANGIOGRAPHY CHEST, ABDOMEN AND PELVIS TECHNIQUE: Multidetector CT imaging through the chest, abdomen and pelvis was performed using the  standard protocol during bolus administration of intravenous contrast. Multiplanar reconstructed images and MIPs were obtained and reviewed to evaluate the vascular anatomy. CONTRAST:  OMNIPAQUE IOHEXOL 350 MG/ML SOLN COMPARISON:  None. FINDINGS: CTA CHEST FINDINGS Cardiovascular: There is homogeneous enhancement in thoracic aorta. There is ectasia of ascending thoracic aorta measuring 4 cm. There are intraluminal filling defects in the segmental and subsegmental pulmonary artery branches in the posteromedial right lower lobe. No other definite intraluminal filling defects are seen. There are no definite signs of acute right ventricular strain. Mediastinum/Nodes: No significant lymphadenopathy is seen. There is fluid in the lumen of thoracic esophagus suggesting gastroesophageal reflux. Lungs/Pleura: Small patchy infiltrates are seen in the posterior right apex. There are linear densities in left lower lung fields. There is no pleural effusion or pneumothorax. Musculoskeletal: Marked compression fracture is seen in the body of T12 vertebra which was not seen in an earlier examination done on 12/21/2016. Review of the MIP images confirms the above findings. CTA ABDOMEN AND PELVIS FINDINGS VASCULAR Aorta: There is ectasia of proximal abdominal aorta with atherosclerotic plaques and calcifications. There is infrarenal aortic aneurysm measuring 8.1 x 6.3 cm. Infrarenal aortic aneurysm measured 5.9 x 5 cm in the examination of 12/21/2016. There is previous endovascular stent repair in the infrarenal abdominal aortic aneurysm. There are calcifications are seen in the lumen of infrarenal aortic aneurysm. There is linear tubular contrast filled structure in the upper aspect of infrarenal aortic aneurysm which appears to terminate within the native aneurysm. Celiac: Patent SMA: Patent Renals: Patent IMA: Small caliber IMA is patent. Veins: Unremarkable. Review of the MIP images confirms the above findings. NON-VASCULAR  Hepatobiliary: There is fatty infiltration in the liver. Surgical clips are seen in gallbladder fossa. Pancreas: No focal abnormality is seen. Spleen: Unremarkable. Adrenals/Urinary Tract: Adrenals are unremarkable. There is no hydronephrosis. There is cortical thinning. Are no renal or ureteral stones. Stomach/Bowel: Small hiatal hernia is seen. There is fluid in the thoracic esophagus suggesting gastroesophageal reflux. There is no significant small bowel dilation. Appendix is not dilated. There is no significant wall thickening in colon. There is no pericolic stranding. Lymphatic: Unremarkable. Reproductive: Unremarkable. Other: There is no ascites or pneumoperitoneum. Musculoskeletal: Degenerative changes are noted in the pubic symphysis. There is marked compression of anterior aspect of body of T12 vertebra. There is minimal anterolisthesis at L3-L4 level. Review of the MIP images confirms the above findings. IMPRESSION: There is acute pulmonary embolism in the right lower lung fields with small thrombus burden. There is no evidence of acute right ventricular strain. Small patchy infiltrate is seen in the right upper lobe suggesting pneumonia. Linear densities in the left lower lung fields suggest scarring or subsegmental atelectasis. There is large infrarenal aortic aneurysm measuring up to 8.1 cm  in diameter. There is significant interval increase in size of aneurysm in comparison with previous study done on 12/21/2016. There are few scattered calcifications in the native aneurysm. There is a tubular contrast filled structure in the upper aspect of native aneurysm suggesting possible endoleak. There is interval appearance of marked compression fracture in the anterior aspect of body of T12 vertebra. There is no evidence of intestinal obstruction or pneumoperitoneum. There is no hydronephrosis. There is no ascites or pneumoperitoneum. Fatty liver. Gastroesophageal reflux. Imaging finding of acute pulmonary  embolism and significant increase in size of infrarenal aortic aneurysm were relayed to Dr. Linna Darner by telephone call. Electronically Signed   By: Ernie Avena M.D.   On: 08/15/2021 14:27    Subjective: Patient was seen and examined today.  No new complaints.  She had her embolization with vascular yesterday and tolerated the procedure well.  Ready to go back to her facility.  Discharge Exam: Vitals:   08/19/21 0428 08/19/21 0804  BP: 103/81 (!) 91/43  Pulse: 72 66  Resp: 16 18  Temp: 97.7 F (36.5 C) 97.6 F (36.4 C)  SpO2: 100% 99%   Vitals:   08/18/21 1900 08/18/21 2103 08/19/21 0428 08/19/21 0804  BP: 130/80 (!) 119/51 103/81 (!) 91/43  Pulse:  69 72 66  Resp: 18 18 16 18   Temp:  (!) 97.3 F (36.3 C) 97.7 F (36.5 C) 97.6 F (36.4 C)  TempSrc:  Oral  Oral  SpO2: 98% 98% 100% 99%  Weight:      Height:        General: Pt is alert, awake, not in acute distress Cardiovascular: RRR, S1/S2 +, no rubs, no gallops Respiratory: CTA bilaterally, no wheezing, no rhonchi Abdominal: Soft, NT, ND, bowel sounds + Extremities: no edema, no cyanosis   The results of significant diagnostics from this hospitalization (including imaging, microbiology, ancillary and laboratory) are listed below for reference.    Microbiology: Recent Results (from the past 240 hour(s))  Resp Panel by RT-PCR (Flu A&B, Covid) Nasopharyngeal Swab     Status: Abnormal   Collection Time: 08/15/21  4:00 AM   Specimen: Nasopharyngeal Swab; Nasopharyngeal(NP) swabs in vial transport medium  Result Value Ref Range Status   SARS Coronavirus 2 by RT PCR POSITIVE (A) NEGATIVE Final    Comment: (NOTE) SARS-CoV-2 target nucleic acids are DETECTED.  The SARS-CoV-2 RNA is generally detectable in upper respiratory specimens during the acute phase of infection. Positive results are indicative of the presence of the identified virus, but do not rule out bacterial infection or co-infection with other pathogens  not detected by the test. Clinical correlation with patient history and other diagnostic information is necessary to determine patient infection status. The expected result is Negative.  Fact Sheet for Patients: 08/17/21  Fact Sheet for Healthcare Providers: BloggerCourse.com  This test is not yet approved or cleared by the [place] FDA and  has been authorized for detection and/or diagnosis of SARS-CoV-2 by FDA under an Emergency Use Authorization (EUA).  This EUA will remain in effect (meaning this test can be used) for the duration of  the COVID-19 declaration under Section 564(b)(1) of the A ct, 21 U.S.C. section 360bbb-3(b)(1), unless the authorization is terminated or revoked sooner.     Influenza A by PCR NEGATIVE NEGATIVE Final   Influenza B by PCR NEGATIVE NEGATIVE Final    Comment: (NOTE) The Xpert Xpress SARS-CoV-2/FLU/RSV plus assay is intended as an aid in the diagnosis of influenza from Nasopharyngeal swab  specimens and should not be used as a sole basis for treatment. Nasal washings and aspirates are unacceptable for Xpert Xpress SARS-CoV-2/FLU/RSV testing.  Fact Sheet for Patients: BloggerCourse.com  Fact Sheet for Healthcare Providers: SeriousBroker.it  This test is not yet approved or cleared by the [place] FDA and has been authorized for detection and/or diagnosis of SARS-CoV-2 by FDA under an Emergency Use Authorization (EUA). This EUA will remain in effect (meaning this test can be used) for the duration of the COVID-19 declaration under Section 564(b)(1) of the Act, 21 U.S.C. section 360bbb-3(b)(1), unless the authorization is terminated or revoked.  Performed at [facility], [address] [place], [place] [address]      Labs: BNP (last 3 results) Recent Labs    08/15/21 0717  BNP 808.4*   Basic Metabolic  Panel: Recent Labs  Lab 08/15/21 0359  NA 136  K 3.6  CL 104  CO2 24  GLUCOSE 142*  BUN 12  CREATININE 0.63  CALCIUM 8.2*   Liver Function Tests: Recent Labs  Lab 08/15/21 0359  AST 43*  ALT 14  ALKPHOS 97  BILITOT 1.3*  PROT 6.3*  ALBUMIN 2.7*   Recent Labs  Lab 08/15/21 0359  LIPASE 30   No results for input(s): AMMONIA in the last 168 hours. CBC: Recent Labs  Lab 08/15/21 0359 08/15/21 1605 08/15/21 2219 08/16/21 0304 08/17/21 0909 08/18/21 0521 08/19/21 0440  WBC 5.2  --   --  4.4 4.4 3.6* 5.7  NEUTROABS 3.8  --   --   --   --   --   --   HGB 10.9*   < > 10.5* 9.1* 7.7* 7.4* 9.9*  HCT 32.0*   < > 30.3* 26.6* 22.2* 21.3* 28.4*  MCV 102.6*  --   --  104.3* 103.3* 102.4* 99.0  PLT 397  --   --  280 268 243 226   < > = values in this interval not displayed.   Cardiac Enzymes: No results for input(s): CKTOTAL, CKMB, CKMBINDEX, TROPONINI in the last 168 hours. BNP: Invalid input(s): POCBNP CBG: No results for input(s): GLUCAP in the last 168 hours. D-Dimer No results for input(s): DDIMER in the last 72 hours. Hgb A1c No results for input(s): HGBA1C in the last 72 hours. Lipid Profile No results for input(s): CHOL, HDL, LDLCALC, TRIG, CHOLHDL, LDLDIRECT in the last 72 hours. Thyroid function studies No results for input(s): TSH, T4TOTAL, T3FREE, THYROIDAB in the last 72 hours.  Invalid input(s): FREET3 Anemia work up Recent Labs    08/17/21 1711 08/18/21 0521 08/18/21 1025  VITAMINB12 3,686*  --   --   FOLATE  --   --  6.7  FERRITIN  --   --  1,045*  TIBC  --   --  125*  IRON  --   --  102  RETICCTPCT  --  1.6  --    Urinalysis    Component Value Date/Time   COLORURINE YELLOW (A) 03/22/2018 1549   APPEARANCEUR CLEAR (A) 03/22/2018 1549   LABSPEC 1.008 03/22/2018 1549   PHURINE 5.0 03/22/2018 1549   GLUCOSEU >=500 (A) 03/22/2018 1549   HGBUR NEGATIVE 03/22/2018 1549   BILIRUBINUR NEGATIVE 03/22/2018 1549   KETONESUR 5 (A) 03/22/2018  1549   PROTEINUR NEGATIVE 03/22/2018 1549   NITRITE NEGATIVE 03/22/2018 1549   LEUKOCYTESUR NEGATIVE 03/22/2018 1549   Sepsis Labs Invalid input(s): PROCALCITONIN,  WBC,  LACTICIDVEN Microbiology Recent Results (from the past 240 hour(s))  Resp  Panel by RT-PCR (Flu A&B, Covid) Nasopharyngeal Swab     Status: Abnormal   Collection Time: 08/15/21  4:00 AM   Specimen: Nasopharyngeal Swab; Nasopharyngeal(NP) swabs in vial transport medium  Result Value Ref Range Status   SARS Coronavirus 2 by RT PCR POSITIVE (A) NEGATIVE Final    Comment: (NOTE) SARS-CoV-2 target nucleic acids are DETECTED.  The SARS-CoV-2 RNA is generally detectable in upper respiratory specimens during the acute phase of infection. Positive results are indicative of the presence of the identified virus, but do not rule out bacterial infection or co-infection with other pathogens not detected by the test. Clinical correlation with patient history and other diagnostic information is necessary to determine patient infection status. The expected result is Negative.  Fact Sheet for Patients: BloggerCourse.com  Fact Sheet for Healthcare Providers: SeriousBroker.it  This test is not yet approved or cleared by the [place] FDA and  has been authorized for detection and/or diagnosis of SARS-CoV-2 by FDA under an Emergency Use Authorization (EUA).  This EUA will remain in effect (meaning this test can be used) for the duration of  the COVID-19 declaration under Section 564(b)(1) of the A ct, 21 U.S.C. section 360bbb-3(b)(1), unless the authorization is terminated or revoked sooner.     Influenza A by PCR NEGATIVE NEGATIVE Final   Influenza B by PCR NEGATIVE NEGATIVE Final    Comment: (NOTE) The Xpert Xpress SARS-CoV-2/FLU/RSV plus assay is intended as an aid in the diagnosis of influenza from Nasopharyngeal swab specimens and should not be used as a sole basis for  treatment. Nasal washings and aspirates are unacceptable for Xpert Xpress SARS-CoV-2/FLU/RSV testing.  Fact Sheet for Patients: BloggerCourse.com  Fact Sheet for Healthcare Providers: SeriousBroker.it  This test is not yet approved or cleared by the [place] FDA and has been authorized for detection and/or diagnosis of SARS-CoV-2 by FDA under an Emergency Use Authorization (EUA). This EUA will remain in effect (meaning this test can be used) for the duration of the COVID-19 declaration under Section 564(b)(1) of the Act, 21 U.S.C. section 360bbb-3(b)(1), unless the authorization is terminated or revoked.  Performed at [facility], [address] [place], [place] [address]     Time coordinating discharge: Over 30 minutes  SIGNED:  Arnetha Courser, MD  [facility] 08/19/2021, 12:14 PM  If 7PM-7AM, please contact night-coverage [email or web address]  This record has been created using Conservation officer, historic buildings. Errors have been sought and corrected,but may not always be located. Such creation errors do not reflect on the standard of care.

## 2021-08-25 ENCOUNTER — Telehealth: Payer: Self-pay

## 2021-08-25 NOTE — Telephone Encounter (Signed)
Called pt, no answer and no VM... pt needs to schedule New pt appt with provider in the office

## 2021-08-25 NOTE — Telephone Encounter (Signed)
Pasty Spillers, MD  Mahamud Metts, Brendia Sacks, CMA Please make clinic follow up in 4-6 weeks

## 2021-09-10 ENCOUNTER — Telehealth: Payer: Self-pay

## 2021-09-10 NOTE — Telephone Encounter (Signed)
Pt needs to schedule hosp f/u new pt appt... offered Feb 27 at 1:15 with Dr Tobi Bastos

## 2021-09-20 ENCOUNTER — Emergency Department: Payer: Medicare HMO

## 2021-09-20 ENCOUNTER — Other Ambulatory Visit: Payer: Self-pay

## 2021-09-20 ENCOUNTER — Inpatient Hospital Stay
Admission: EM | Admit: 2021-09-20 | Discharge: 2021-09-22 | DRG: 951 | Disposition: E | Payer: Medicare HMO | Attending: Internal Medicine | Admitting: Internal Medicine

## 2021-09-20 DIAGNOSIS — R627 Adult failure to thrive: Secondary | ICD-10-CM | POA: Diagnosis present

## 2021-09-20 DIAGNOSIS — R4189 Other symptoms and signs involving cognitive functions and awareness: Secondary | ICD-10-CM | POA: Diagnosis present

## 2021-09-20 DIAGNOSIS — Z79818 Long term (current) use of other agents affecting estrogen receptors and estrogen levels: Secondary | ICD-10-CM

## 2021-09-20 DIAGNOSIS — Z885 Allergy status to narcotic agent status: Secondary | ICD-10-CM | POA: Diagnosis not present

## 2021-09-20 DIAGNOSIS — G928 Other toxic encephalopathy: Secondary | ICD-10-CM | POA: Diagnosis present

## 2021-09-20 DIAGNOSIS — Z7901 Long term (current) use of anticoagulants: Secondary | ICD-10-CM

## 2021-09-20 DIAGNOSIS — L8990 Pressure ulcer of unspecified site, unspecified stage: Secondary | ICD-10-CM | POA: Diagnosis present

## 2021-09-20 DIAGNOSIS — Z880 Allergy status to penicillin: Secondary | ICD-10-CM

## 2021-09-20 DIAGNOSIS — N179 Acute kidney failure, unspecified: Secondary | ICD-10-CM | POA: Diagnosis present

## 2021-09-20 DIAGNOSIS — K56609 Unspecified intestinal obstruction, unspecified as to partial versus complete obstruction: Secondary | ICD-10-CM | POA: Diagnosis present

## 2021-09-20 DIAGNOSIS — K760 Fatty (change of) liver, not elsewhere classified: Secondary | ICD-10-CM | POA: Diagnosis present

## 2021-09-20 DIAGNOSIS — E039 Hypothyroidism, unspecified: Secondary | ICD-10-CM | POA: Diagnosis present

## 2021-09-20 DIAGNOSIS — Z66 Do not resuscitate: Secondary | ICD-10-CM | POA: Diagnosis present

## 2021-09-20 DIAGNOSIS — K559 Vascular disorder of intestine, unspecified: Secondary | ICD-10-CM | POA: Diagnosis present

## 2021-09-20 DIAGNOSIS — M353 Polymyalgia rheumatica: Secondary | ICD-10-CM | POA: Diagnosis present

## 2021-09-20 DIAGNOSIS — T68XXXA Hypothermia, initial encounter: Secondary | ICD-10-CM | POA: Diagnosis present

## 2021-09-20 DIAGNOSIS — Z8616 Personal history of COVID-19: Secondary | ICD-10-CM | POA: Diagnosis not present

## 2021-09-20 DIAGNOSIS — N10 Acute tubulo-interstitial nephritis: Secondary | ICD-10-CM | POA: Diagnosis present

## 2021-09-20 DIAGNOSIS — Z79890 Hormone replacement therapy (postmenopausal): Secondary | ICD-10-CM

## 2021-09-20 DIAGNOSIS — I252 Old myocardial infarction: Secondary | ICD-10-CM

## 2021-09-20 DIAGNOSIS — Z515 Encounter for palliative care: Secondary | ICD-10-CM

## 2021-09-20 DIAGNOSIS — K529 Noninfective gastroenteritis and colitis, unspecified: Secondary | ICD-10-CM | POA: Diagnosis present

## 2021-09-20 DIAGNOSIS — J9691 Respiratory failure, unspecified with hypoxia: Secondary | ICD-10-CM | POA: Diagnosis present

## 2021-09-20 DIAGNOSIS — Z91048 Other nonmedicinal substance allergy status: Secondary | ICD-10-CM | POA: Diagnosis not present

## 2021-09-20 DIAGNOSIS — E872 Acidosis, unspecified: Secondary | ICD-10-CM | POA: Diagnosis present

## 2021-09-20 DIAGNOSIS — K625 Hemorrhage of anus and rectum: Secondary | ICD-10-CM | POA: Diagnosis present

## 2021-09-20 DIAGNOSIS — Z20822 Contact with and (suspected) exposure to covid-19: Secondary | ICD-10-CM | POA: Diagnosis present

## 2021-09-20 DIAGNOSIS — R680 Hypothermia, not associated with low environmental temperature: Secondary | ICD-10-CM | POA: Diagnosis present

## 2021-09-20 DIAGNOSIS — Z79899 Other long term (current) drug therapy: Secondary | ICD-10-CM

## 2021-09-20 DIAGNOSIS — R6521 Severe sepsis with septic shock: Secondary | ICD-10-CM | POA: Diagnosis present

## 2021-09-20 DIAGNOSIS — I959 Hypotension, unspecified: Secondary | ICD-10-CM | POA: Diagnosis not present

## 2021-09-20 DIAGNOSIS — E785 Hyperlipidemia, unspecified: Secondary | ICD-10-CM | POA: Diagnosis present

## 2021-09-20 DIAGNOSIS — R54 Age-related physical debility: Secondary | ICD-10-CM | POA: Diagnosis present

## 2021-09-20 DIAGNOSIS — Z8249 Family history of ischemic heart disease and other diseases of the circulatory system: Secondary | ICD-10-CM

## 2021-09-20 DIAGNOSIS — R0902 Hypoxemia: Secondary | ICD-10-CM | POA: Diagnosis not present

## 2021-09-20 DIAGNOSIS — Z87891 Personal history of nicotine dependence: Secondary | ICD-10-CM | POA: Diagnosis not present

## 2021-09-20 DIAGNOSIS — A419 Sepsis, unspecified organism: Secondary | ICD-10-CM | POA: Diagnosis present

## 2021-09-20 DIAGNOSIS — Z888 Allergy status to other drugs, medicaments and biological substances status: Secondary | ICD-10-CM | POA: Diagnosis not present

## 2021-09-20 LAB — CBC WITH DIFFERENTIAL/PLATELET
Abs Immature Granulocytes: 0.12 10*3/uL — ABNORMAL HIGH (ref 0.00–0.07)
Basophils Absolute: 0.1 10*3/uL (ref 0.0–0.1)
Basophils Relative: 0 %
Eosinophils Absolute: 0 10*3/uL (ref 0.0–0.5)
Eosinophils Relative: 0 %
HCT: 33.6 % — ABNORMAL LOW (ref 36.0–46.0)
Hemoglobin: 10.8 g/dL — ABNORMAL LOW (ref 12.0–15.0)
Immature Granulocytes: 1 %
Lymphocytes Relative: 10 %
Lymphs Abs: 1.5 10*3/uL (ref 0.7–4.0)
MCH: 36.6 pg — ABNORMAL HIGH (ref 26.0–34.0)
MCHC: 32.1 g/dL (ref 30.0–36.0)
MCV: 113.9 fL — ABNORMAL HIGH (ref 80.0–100.0)
Monocytes Absolute: 0.7 10*3/uL (ref 0.1–1.0)
Monocytes Relative: 4 %
Neutro Abs: 13.1 10*3/uL — ABNORMAL HIGH (ref 1.7–7.7)
Neutrophils Relative %: 85 %
Platelets: 277 10*3/uL (ref 150–400)
RBC: 2.95 MIL/uL — ABNORMAL LOW (ref 3.87–5.11)
RDW: 18.4 % — ABNORMAL HIGH (ref 11.5–15.5)
Smear Review: NORMAL
WBC: 15.5 10*3/uL — ABNORMAL HIGH (ref 4.0–10.5)
nRBC: 3 % — ABNORMAL HIGH (ref 0.0–0.2)

## 2021-09-20 LAB — PROTIME-INR
INR: 3.7 — ABNORMAL HIGH (ref 0.8–1.2)
Prothrombin Time: 36.4 seconds — ABNORMAL HIGH (ref 11.4–15.2)

## 2021-09-20 LAB — TSH: TSH: 6.156 u[IU]/mL — ABNORMAL HIGH (ref 0.350–4.500)

## 2021-09-20 LAB — COMPREHENSIVE METABOLIC PANEL
ALT: 98 U/L — ABNORMAL HIGH (ref 0–44)
AST: 489 U/L — ABNORMAL HIGH (ref 15–41)
Albumin: <1.5 g/dL — ABNORMAL LOW (ref 3.5–5.0)
Alkaline Phosphatase: 77 U/L (ref 38–126)
Anion gap: 16 — ABNORMAL HIGH (ref 5–15)
BUN: 18 mg/dL (ref 8–23)
CO2: 8 mmol/L — ABNORMAL LOW (ref 22–32)
Calcium: 6.6 mg/dL — ABNORMAL LOW (ref 8.9–10.3)
Chloride: 115 mmol/L — ABNORMAL HIGH (ref 98–111)
Creatinine, Ser: 1.32 mg/dL — ABNORMAL HIGH (ref 0.44–1.00)
GFR, Estimated: 40 mL/min — ABNORMAL LOW (ref >60)
Glucose, Bld: 45 mg/dL — ABNORMAL LOW (ref 70–99)
Potassium: 4 mmol/L (ref 3.5–5.1)
Sodium: 139 mmol/L (ref 135–145)
Total Bilirubin: 0.9 mg/dL (ref 0.3–1.2)
Total Protein: 3.2 g/dL — ABNORMAL LOW (ref 6.5–8.1)

## 2021-09-20 LAB — TROPONIN I (HIGH SENSITIVITY): Troponin I (High Sensitivity): 16 ng/L (ref <18)

## 2021-09-20 LAB — URINALYSIS, ROUTINE W REFLEX MICROSCOPIC
Glucose, UA: NEGATIVE mg/dL
Ketones, ur: 15 mg/dL — AB
Leukocytes,Ua: NEGATIVE
Nitrite: NEGATIVE
Protein, ur: 100 mg/dL — AB
RBC / HPF: >50 RBC/hpf — ABNORMAL HIGH (ref 0–5)
Specific Gravity, Urine: 1.025 (ref 1.005–1.030)
pH: 5.5 (ref 5.0–8.0)

## 2021-09-20 LAB — BLOOD GAS, VENOUS
Acid-base deficit: 21.6 mmol/L — ABNORMAL HIGH (ref 0.0–2.0)
Bicarbonate: 6.7 mmol/L — ABNORMAL LOW (ref 20.0–28.0)
O2 Saturation: 73.2 %
Patient temperature: 37
pCO2, Ven: 23 mmHg — ABNORMAL LOW (ref 44.0–60.0)
pH, Ven: 7.07 — CL (ref 7.250–7.430)
pO2, Ven: 56 mmHg — ABNORMAL HIGH (ref 32.0–45.0)

## 2021-09-20 LAB — RESP PANEL BY RT-PCR (FLU A&B, COVID) ARPGX2
Influenza A by PCR: NEGATIVE
Influenza B by PCR: NEGATIVE
SARS Coronavirus 2 by RT PCR: NEGATIVE

## 2021-09-20 LAB — APTT: aPTT: 52 seconds — ABNORMAL HIGH (ref 24–36)

## 2021-09-20 LAB — I-STAT CREATININE, ED: Creatinine, Ser: 1.2 mg/dL — ABNORMAL HIGH (ref 0.44–1.00)

## 2021-09-20 LAB — PROCALCITONIN: Procalcitonin: 0.48 ng/mL

## 2021-09-20 LAB — LACTIC ACID, PLASMA
Lactic Acid, Venous: >9 mmol/L (ref 0.5–1.9)
Lactic Acid, Venous: >9 mmol/L (ref 0.5–1.9)

## 2021-09-20 LAB — MAGNESIUM: Magnesium: 1.6 mg/dL — ABNORMAL LOW (ref 1.7–2.4)

## 2021-09-20 MED ORDER — IOHEXOL 300 MG/ML  SOLN
75.0000 mL | Freq: Once | INTRAMUSCULAR | Status: AC | PRN
  Administered 2021-09-20: 75 mL via INTRAVENOUS

## 2021-09-20 MED ORDER — NOREPINEPHRINE 4 MG/250ML-% IV SOLN
INTRAVENOUS | Status: AC
  Administered 2021-09-20: 2 ug/min via INTRAVENOUS
  Filled 2021-09-20: qty 250

## 2021-09-20 MED ORDER — POLYETHYLENE GLYCOL 3350 17 G PO PACK: 17.0000 g | PACK | Freq: Two times a day (BID) | ORAL | Status: DC | PRN

## 2021-09-20 MED ORDER — FENTANYL CITRATE PF 50 MCG/ML IJ SOSY: 25.0000 ug | PREFILLED_SYRINGE | Freq: Once | INTRAMUSCULAR | Status: AC

## 2021-09-20 MED ORDER — LOPERAMIDE HCL 1 MG/7.5ML PO SUSP
2.0000 mg | ORAL | Status: DC | PRN
  Filled 2021-09-20: qty 15

## 2021-09-20 MED ORDER — GLYCOPYRROLATE 0.2 MG/ML IJ SOLN
0.2000 mg | INTRAMUSCULAR | Status: DC | PRN
  Filled 2021-09-20: qty 1

## 2021-09-20 MED ORDER — GLYCOPYRROLATE 1 MG PO TABS
1.0000 mg | ORAL_TABLET | ORAL | Status: DC | PRN
  Filled 2021-09-20: qty 1

## 2021-09-20 MED ORDER — VANCOMYCIN HCL 1250 MG/250ML IV SOLN
1250.0000 mg | Freq: Once | INTRAVENOUS | Status: AC
  Administered 2021-09-20: 1250 mg via INTRAVENOUS
  Filled 2021-09-20: qty 250

## 2021-09-20 MED ORDER — ACETAMINOPHEN 325 MG PO TABS: 650.0000 mg | ORAL_TABLET | Freq: Four times a day (QID) | ORAL | Status: DC | PRN

## 2021-09-20 MED ORDER — SODIUM CHLORIDE 0.9 % IV BOLUS
3000.0000 mL | Freq: Once | INTRAVENOUS | Status: AC
Start: 2021-09-20 — End: 2021-09-20
  Administered 2021-09-20: 3000 mL via INTRAVENOUS

## 2021-09-20 MED ORDER — POLYVINYL ALCOHOL 1.4 % OP SOLN: 1.0000 [drp] | Freq: Four times a day (QID) | OPHTHALMIC | Status: DC | PRN

## 2021-09-20 MED ORDER — DIPHENHYDRAMINE HCL 50 MG/ML IJ SOLN: 25.0000 mg | INTRAMUSCULAR | Status: DC | PRN

## 2021-09-20 MED ORDER — DEXTROSE 5 % IV SOLN: INTRAVENOUS | Status: DC

## 2021-09-20 MED ORDER — SODIUM CHLORIDE 0.9 % IV SOLN
2.0000 g | Freq: Once | INTRAVENOUS | Status: AC
  Administered 2021-09-20: 2 g via INTRAVENOUS

## 2021-09-20 MED ORDER — MORPHINE SULFATE (PF) 2 MG/ML IV SOLN
INTRAVENOUS | Status: AC
  Filled 2021-09-20: qty 1

## 2021-09-20 MED ORDER — SODIUM CHLORIDE 0.9 % IV BOLUS
1000.0000 mL | Freq: Once | INTRAVENOUS | Status: AC
Start: 2021-09-20 — End: 2021-09-20
  Administered 2021-09-20: 1000 mL via INTRAVENOUS

## 2021-09-20 MED ORDER — NOREPINEPHRINE 4 MG/250ML-% IV SOLN: 0.0000 ug/min | INTRAVENOUS | Status: DC

## 2021-09-20 MED ORDER — LACTATED RINGERS IV BOLUS: 500.0000 mL | Freq: Once | INTRAVENOUS | Status: DC

## 2021-09-20 MED ORDER — LACTATED RINGERS IV SOLN: INTRAVENOUS | Status: DC

## 2021-09-20 MED ORDER — SODIUM CHLORIDE 0.9 % IV BOLUS: 1000.0000 mL | Freq: Once | INTRAVENOUS | Status: DC

## 2021-09-20 MED ORDER — FENTANYL CITRATE PF 50 MCG/ML IJ SOSY
PREFILLED_SYRINGE | INTRAMUSCULAR | Status: AC
  Administered 2021-09-20: 25 ug via INTRAVENOUS
  Filled 2021-09-20: qty 1

## 2021-09-20 MED ORDER — METRONIDAZOLE 500 MG/100ML IV SOLN
500.0000 mg | Freq: Once | INTRAVENOUS | Status: AC
  Administered 2021-09-20: 500 mg via INTRAVENOUS

## 2021-09-20 MED ORDER — MIDAZOLAM HCL 2 MG/2ML IJ SOLN: 2.0000 mg | INTRAMUSCULAR | Status: DC | PRN

## 2021-09-20 MED ORDER — ACETAMINOPHEN 325 MG RE SUPP: 650.0000 mg | Freq: Four times a day (QID) | RECTAL | Status: DC | PRN

## 2021-09-20 MED ORDER — MORPHINE SULFATE (PF) 2 MG/ML IV SOLN
2.0000 mg | INTRAVENOUS | Status: DC | PRN
  Administered 2021-09-20: 2 mg via INTRAVENOUS

## 2021-09-20 NOTE — ED Provider Notes (Signed)
I assumed care of this patient approximately 1530.  Please see outgoing further syncopal detail surrounding patient's initial evaluation assessment.  In brief patient presents via EMS from nursing facility for unresponsiveness.  Patient reportedly on arrival is hypothermic, hypotensive and tachypneic and minimally responsive.  She was DNR confirmed with appropriate paperwork on arrival.  She was immediately placed on BiPAP.  Peripheral access was unable to be obtained at all from the right I did place a left IJ central line.  Patient immediately started on IV fluids pressors and broad-spectrum antibiotics.  Concern for possible septic shock with additional etiologies including acute endocrine derangement, ACS, CVA and others.  EKG shows sinus rhythm with a ventricular rate of 66 and some nonspecific changes throughout.  Chest x-ray shows no focal consolidation, effusion, edema, pneumothorax or any other clear acute process.  Interpreted this x-ray and also reviewed radiologist interpretation and agree with the findings.  CBC shows WBC count of 15.5 and hemoglobin of 10.8.  Last hemoglobin I can see in care everywhere was 1 month ago at 9.9.  Platelets are 277 today.  Pro-Cal 0.48.  INR is 3.7.  COVID influenza PCR is negative.  Troponin nonelevated 16 not suggestive of acute ischemic event.  CT head shows some volume loss and fairly large ventricles.  There is evidence of chronic microvascular ischemic changes.  No clear acute ischemia hemorrhage or other clear acute process.  I interpreted this and reviewed radiologist findings.  I agree with her findings.  Statin and pelvis obtained does not show clear pneumonia on my assessment or PE.  Some nausea specific proximal bowel dilation on my assessment of the abdomen pelvis.  Radiology interpreted this and noted findings concerning for possible early small.  Obstruction versus ileus.  There is also evidence of some infrarenal aortic aneurysm that has been  repaired and evidence of possible acute pyelonephritis on the left.  No other acute findings noted.  Given patient's critical illness requiring pressor support and multiorgan failure I reached out to the medical ICU physician Dr. Belia Heman who came and evaluated the patient at bedside and had extensive discussion regarding goals of care with family at bedside.  Ultimately patient transition to comfort care.  Patient will be admitted to medicine service for ongoing comfort care measures.  I discussed this with admitting hospitalist will place these orders.  .Critical Care Performed by: Gilles Chiquito, MD Authorized by: Gilles Chiquito, MD   Critical care provider statement:    Critical care time (minutes):  30   Critical care was necessary to treat or prevent imminent or life-threatening deterioration of the following conditions:  Shock, respiratory failure and circulatory failure   Critical care was time spent personally by me on the following activities:  Development of treatment plan with patient or surrogate, discussions with consultants, evaluation of patient's response to treatment, examination of patient, ordering and review of laboratory studies, ordering and review of radiographic studies, ordering and performing treatments and interventions, pulse oximetry, re-evaluation of patient's condition and review of old charts   I assumed direction of critical care for this patient from another provider in my specialty: yes     Care discussed with: admitting provider         Gilles Chiquito, MD Oct 16, 2021 2118

## 2021-09-20 NOTE — H&P (Addendum)
History and Physical   Margaret Castillo [id number] DOB: Oct 11, 1934 DOA: 09/06/2021  PCP: Annett Gula, MD  Outpatient Specialists: Dr. Lucky Cowboy, vascular Patient coming from: [facility]  I have personally briefly reviewed patient's old medical records in [place].  Chief Concern: Vomiting and nonresponsiveness  HPI: [name] is an 87 year old female with medical history of abdominal aortic aneurysm status post endoleak coiling embolization, stenosis of the left subclavian artery, history of bilateral carotid artery stenosis, remote history of tobacco use, history of gastroenteritis, history of COVID-19 infection, history of GI bleeding, history of internal hemorrhoids with prolapse, hypothyroid, history of chronic anticoagulation use, insomnia, failure to thrive, constipation, hyperlipidemia, who presents emergency department from [facility] facility for chief concerns of vomiting and unresponsiveness.  Initial vitals in the emergency department showed temperature of 92.4, respiration rate of 23, heart rate of 86, initial blood pressure 79/68 and decreased to 48/20, SPO2 of 64% on 8 L nasal cannula.  A VBG in the emergency department showed 7.03/14/55.   Serum sodium 139, potassium 4.0, chloride 115, bicarb of 8, nonfasting blood glucose 45, BUN of 18, serum creatinine of 1.32, lactic acid greater than 9x2, procalcitonin was elevated at 0.48, white cell count was markedly elevated at 15.5, hemoglobin 10.8, platelets of 277.  TSH of 6.156.  COVID/influenza A/influenza B PCR were negative.  UA was positive for hemoglobin large, large amounts of bilirubin, and negative for nitrates and leukocytes.  Imaging in the emergency department included CT of the head without contrast which was read as no acute intracranial abnormality in a patient with diffuse cerebral and cerebellar atrophy as well as chronic microvascular ischemic changes.  CT of the chest abdomen pelvis  with contrast was read as proximal and mid small bowel loops mildly increased in caliber with air-fluid levels.  Distal small bowel loops are decreased to normal caliber.  May reflect low-grade partial obstruction versus ileus secondary to enteritis.  Similar appearance of large infrarenal abdominal aortic aneurysm status post endovascular stent graft repair.  Left kidney striated nephrograms, may reflect acute pyelonephritis versus sequelae of embolic disease.  Hepatic steatosis.  Bilateral rib fracture deformities.  Patient was initially treated with cefepime 2 g IV, metronidazole 500 mg IV once, vancomycin IV, sepsis bolus with a total of 4 L sodium chloride.  Patient continued to deteriorate.  EDP discussed with family regarding poor prognosis.  Family was agreeable to comfort measures.  Patient was started on comfort care measures in the emergency department. _____  At bedside patient was nonresponsive, mild shallow breathing, mildly tachypneic, O sign, actively dying.  She does not appear to be in acute distress.  Son and daughter-in-law were at bedside.  Social history: She lives at nursing facility, [facility].  She is a remote tobacco user, quitting more than 45 years ago.  Family denies EtOH and recreational drug use.  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  Vaccination history: Unknown  ROS: Unable to complete as patient is nonresponsive and actively dying.  ED Course: Discussed with emergency medicine provider, patient requiring hospitalization for chief concerns of comfort measures.  Assessment/Plan  Principal Problem:   End of life care Active Problems:   Rectal bleeding   Polymyalgia rheumatica (HCC)   Symptomatic hypotension   Pressure injury of skin   Severe sepsis with septic shock (CODE) (HCC)   Hypothyroid   Respiratory failure with hypoxia ([place])  AKI (acute kidney injury) ([place])    SBO (small bowel obstruction) (HCC)   Enteritis   Acute pyelonephritis   Hypothermia   Unresponsive state   Metabolic acidosis   Hepatic steatosis   -Patient presenting with apparent small bowel obstruction and/or ileus with enteritis and sepsis. -ED provider had long discussion, family has decided to proceed with comfort care only -Comfort measures only-However, her reserve is likely quite low given her age and overall frailty -Continue comfort care order set utilized with medications -No antibiotics per family's request -Continue oxygen supplementation for comfort measures  Chart reviewed  DVT prophylaxis: None - comfort measures Code Status: DNR - confirmed with family Family Communication: son and daughter in law at bedside Disposition Plan: Anticipate in-hospital death Consults called: None Admission status: Admit - It is my clinical opinion that admission to INPATIENT is reasonable and necessary because of the expectation that this patient will require hospital care that crosses at least 2 midnights to treat this condition based on the medical complexity of the problems presented.  Given the aforementioned information, the predictability of an adverse outcome is felt to be significant.  Past Medical History:  Diagnosis Date   AAA (abdominal aortic aneurysm)    Anemia    Arthritis    Neck   Bladder prolapse, female, acquired    Carotid artery occlusion    Endoleak post endovascular aneurysm repair [facility]), Type I 02/2016   [facility] for aortic cuff placement   Hearing aid worn    Hypothyroidism    Myocardial infarction [facility]) 1982   Polymyalgia rheumatica ([place])    Prolapsed internal hemorrhoids, grade 3    Shingles    Uterine prolapse    Past Surgical History:  Procedure Laterality Date   ABDOMINAL AORTIC ANEURYSM REPAIR  2008   Infrarenal aneurysm stent graft repair   ABDOMINAL AORTIC ENDOVASCULAR STENT GRAFT N/A 02/29/2016   Procedure: Aortic cuff/Gore;   Surgeon: Rosetta Posner, MD;  Location: [place];  Service: Vascular;  Laterality: N/A;   ABDOMINAL AORTOGRAM N/A 08/18/2021   Procedure: ABDOMINAL AORTOGRAM;  Surgeon: Algernon Huxley, MD;  Location: [place] [facility];  Service: Cardiovascular;  Laterality: N/A;  with endoleak coiling   APPENDECTOMY     CAROTID ENDARTERECTOMY  12-07-06   Left CEA   CARPAL TUNNEL RELEASE Left    CATARACT EXTRACTION Bilateral    CHOLECYSTECTOMY     Gall Bladder   COLONOSCOPY WITH PROPOFOL N/A 09/28/2016   Procedure: COLONOSCOPY WITH PROPOFOL;  Surgeon: Milus Banister, MD;  Location: [place];  Service: Endoscopy;  Laterality: N/A;   EMBOLIZATION  08/18/2021   Procedure: EMBOLIZATION;  Surgeon: Algernon Huxley, MD;  Location: [place] [facility];  Service: Cardiovascular;;   HEMORRHOID BANDING     PERIPHERAL VASCULAR CATHETERIZATION N/A 02/03/2016   Procedure: Abdominal Aortogram;  Surgeon: Mal Misty, MD;  Location: [place] [facility];  Service: Cardiovascular;  Laterality: N/A;   PERIPHERAL VASCULAR CATHETERIZATION Bilateral 02/03/2016   Procedure: Lower Extremity Angiography;  Surgeon: Mal Misty, MD;  Location: [place] [facility];  Service: Cardiovascular;  Laterality: Bilateral;   TONSILLECTOMY     Social History:  reports that she quit smoking about 41 years ago. She has never used smokeless tobacco. She reports that she does not drink alcohol and does not use drugs.  Allergies  Allergen Reactions   Tape Other (See Comments)    Skin irritation   Atorvastatin Other (See Comments)  MUSCLE PAIN (all statins)   Codeine Nausea Only   Crestor [Rosuvastatin] Other (See Comments)    Leg pain/ joint pain  ( ALL THE STATIN's )   Penicillins Rash    Has patient had a PCN reaction causing immediate rash, facial/tongue/throat swelling, SOB or lightheadedness with hypotension: Yes Has patient had a PCN reaction causing severe rash involving mucus membranes or skin necrosis: No Has patient had a PCN  reaction that required hospitalization No Has patient had a PCN reaction occurring within the last 10 years: No If all of the above answers are "NO", then may proceed with Cephalosporin use.    Cephalexin Rash   Family History  Problem Relation Age of Onset   Heart disease Father        After age 26    Heart attack Father    Diabetes Sister    Ovarian cancer Sister        may be uterine...   Cancer Brother    Heart disease Brother    Diabetes Brother    Prostate cancer Brother    Stroke Mother    Stroke Sister    Heart disease Brother    Arthritis Brother    Prostate cancer Brother    Heart attack Sister    Diabetes Paternal Grandfather    Family history: Family history reviewed and not pertinent.  Prior to Admission medications   Medication Sig Start Date End Date Taking? Authorizing Provider  acetaminophen (TYLENOL) 500 MG tablet Take 1,000 mg by mouth every 8 (eight) hours as needed for mild pain or moderate pain.    [provider]  apixaban (ELIQUIS) 2.5 MG TABS tablet Take 1 tablet (2.5 mg total) by mouth 2 (two) times daily. 08/19/21   Lorella Nimrod, MD  atorvastatin (LIPITOR) 10 MG tablet Take 10 mg by mouth at bedtime. 08/12/21   [provider]  Cholecalciferol 25 MCG (1000 UT) tablet Take 1,000 Units by mouth daily. 09/26/20   [provider]  conjugated estrogens (PREMARIN) vaginal cream Place 0.5 g vaginally 2 (two) times a week. Applies twice a week at bedtime. 06/15/21   [provider]  ferrous sulfate 324 MG TBEC Take 324 mg by mouth every other day.    [provider]  levothyroxine (SYNTHROID) 75 MCG tablet Take 75 mcg by mouth daily. 09/10/20   [provider]  melatonin 3 MG TABS tablet Take 3 mg by mouth at bedtime as needed (sleep).    [provider]  mirtazapine (REMERON) 15 MG tablet Take 15 mg by mouth at bedtime. 08/12/21   [provider]  Multiple Vitamin (MULTIVITAMIN) tablet  Take 1 tablet by mouth daily.    [provider]  polyethylene glycol (MIRALAX / GLYCOLAX) packet Take 17 g by mouth daily as needed for mild constipation. Patient taking differently: Take 17 g by mouth daily as needed for moderate constipation or mild constipation. 09/29/16   Rogue Bussing, MD   Physical Exam: Vitals:   08/27/2021 1810 08/29/2021 1815 09/05/2021 1930 09/12/2021 2035  BP: (!) 58/46 (!) 48/20    Pulse: 71 (!) 29    Resp: (!) 22 (!) 23 17 16   Temp: (!) 92.4 F (33.6 C)     TempSrc: Axillary     SpO2: (!) 86% 98%    Weight:      Height:       Constitutional: appears age-appropriate, frail, cachectic, actively dying Eyes: Bilateral pupils not reactive to light ENMT: Mucous  membranes are dry.  Unable to assess posterior pharynx, dentition, hearing.   Neck: normal, supple, no masses, no thyromegaly Respiratory: Shallow breaths, no wheezing, no crackles able to be appreciated. No accessory muscle use.  Cardiovascular: Regular rate and rhythm, no murmurs / rubs / gallops. No extremity edema.  No pedal pulses felt.   Abdomen: no tenderness, no masses palpated, no hepatosplenomegaly. Bowel sounds positive.  Musculoskeletal: no clubbing / cyanosis. No joint deformity upper and lower extremities. Good ROM, no contractures, no atrophy. Normal muscle tone.  Skin: no rashes, lesions, ulcers. No induration Neurologic: Sensation intact. Strength 5/5 in all 4.  Psychiatric: Normal judgment and insight. Alert and oriented x 3. Normal mood.   EKG: independently reviewed, showing sinus rhythm with rate of 66, QTc 565  Chest x-ray on Admission: I personally reviewed and I agree with radiologist reading as below.  CT HEAD WO CONTRAST ( )  Result Date: 08/31/2021 CLINICAL DATA:  Mental status change, unknown cause EXAM: CT HEAD WITHOUT CONTRAST TECHNIQUE: Contiguous axial images were obtained from the base of the skull through the vertex without intravenous contrast. RADIATION  DOSE REDUCTION: This exam was performed according to the departmental dose-optimization program which includes automated exposure control, adjustment of the mA and/or kV according to patient size and/or use of iterative reconstruction technique. COMPARISON:  None. BRAIN: BRAIN Cerebral ventricle sizes are concordant with the degree of cerebral volume loss. Patchy and confluent areas of decreased attenuation are noted throughout the deep and periventricular white matter of the cerebral hemispheres bilaterally, compatible with chronic microvascular ischemic disease. No evidence of large-territorial acute infarction. No parenchymal hemorrhage. No mass lesion. No extra-axial collection. No mass effect or midline shift. No hydrocephalus. Basilar cisterns are patent. Vascular: No hyperdense vessel. Atherosclerotic calcifications are present within the cavernous internal carotid arteries. Skull: No acute fracture or focal lesion. Hyperostosis frontalis interna. Sinuses/Orbits: Paranasal sinuses and mastoid air cells are clear. The orbits are unremarkable. Other: None. IMPRESSION: No acute intracranial abnormality In a patient with diffuse cerebral and cerebellar atrophy as well as chronic microvascular ischemic changes. Electronically Signed   By: Tish Frederickson M.D.   On: 09/18/2021 17:16   CT CHEST ABDOMEN PELVIS W CONTRAST  Result Date: 08/28/2021 CLINICAL DATA:  Evaluate for sepsis. Patient presents with weakness and unresponsiveness. EXAM: CT CHEST, ABDOMEN, AND PELVIS WITH CONTRAST TECHNIQUE: Multidetector CT imaging of the chest, abdomen and pelvis was performed following the standard protocol during bolus administration of intravenous contrast. RADIATION DOSE REDUCTION: This exam was performed according to the departmental dose-optimization program which includes automated exposure control, adjustment of the mA and/or kV according to patient size and/or use of iterative reconstruction technique. CONTRAST:   68mL OMNIPAQUE IOHEXOL 300 MG/ML  SOLN COMPARISON:  08/15/2021 FINDINGS: CT CHEST FINDINGS Cardiovascular: Heart size appears within normal limits. Aortic atherosclerosis with coronary artery calcifications. No pericardial effusion. Mediastinum/Nodes: No discrete thyroid nodule. Trachea appears patent and is midline. Dilated esophagus appears fluid-filled, which may reflect underlying esophageal dysmotility. No enlarged axillary, supraclavicular, mediastinal, or hilar lymph nodes. Lungs/Pleura: No pleural effusion. No atelectasis, airspace consolidation or pneumothorax. Right apical interstitial reticulation is identified with chronic nodular architectural distortion measuring 1.9 cm. No acute airspace disease identified. Musculoskeletal: No acute osseous findings. Chronic bilateral rib fracture deformities are again noted and appear similar to the previous exam. Vertebra plana deformity is noted involving the T12 vertebral body, unchanged. CT ABDOMEN PELVIS FINDINGS Hepatobiliary: Hepatic steatosis. Previous cholecystectomy. No signs of bile duct dilatation. Pancreas: Unremarkable. No pancreatic  ductal dilatation or surrounding inflammatory changes. Spleen: Normal in size without focal abnormality. Adrenals/Urinary Tract: Normal appearance of the adrenal glands. New striated nephrograms identified involving the upper pole of the left kidney, image 61/3 and image 64/3. Unchanged appearance of asymmetric cortical atrophy involving the inferior pole of the right kidney, image 19/8. Bilateral renal calcifications are noted which may be vascular in etiology. No signs of obstructive uropathy. No hydroureter or ureteral lithiasis. Small stone is noted within the right posterior bladder measuring 2 mm, image 107/3 Stomach/Bowel: Stomach appears normal. No bowel wall thickening, or inflammation. The proximal and mid small bowel loops are mildly increased in caliber with air-fluid levels. These measure up to 2.7 cm.Distal  small bowel loops are decreased to normal in caliber. The colon appears decompressed up to the level of the sigmoid colon. Moderate stool burden identified within the rectum which may reflect underlying constipation. Vascular/Lymphatic: Again seen is a large infrarenal abdominal aortic aneurysm. The aneurysm sac measures 7.9 x 5.8 cm, image 69/3. Previous endovascular stent graft repair noted. As noted previously linear, tubular appearing contrast filled structures in the proximally aneurysm sac are again noted, image 68/3. No signs abdominopelvic adenopathy. Reproductive: Uterus and bilateral adnexa are unremarkable. Other: No free fluid or fluid collection. Musculoskeletal: No acute or significant osseous findings. IMPRESSION: 1. The proximal and mid small bowel loops are mildly increased in caliber with air-fluid levels. Distal small bowel loops are decreased to normal in caliber. Findings may reflect low-grade partial obstruction versus ileus secondary to enteritis. 2. Similar appearance of large infrarenal abdominal aortic aneurysm status post endovascular stent graft repair. The aneurysm sac measures up to 7.9 cm (previously this was measured at 8.1 cm). Unchanged appearance of linear, tubular appearing contrast filled structures in the proximally aneurysm sac which may represent a endoleak. 3. Left kidney striated nephrograms, new from previous exam. This may reflect acute pyelonephritis versus sequelae of embolic disease. 4. Hepatic steatosis. 5. Chronic bilateral rib fracture deformities. 6. Aortic Atherosclerosis (ICD10-I70.0) and Emphysema (ICD10-J43.9). Electronically Signed   By: Kerby Moors M.D.   On: 08/30/2021 17:44   DG Chest Port 1 View  Result Date: 09/14/2021 CLINICAL DATA:  Suspected Sepsis EXAM: PORTABLE CHEST 1 VIEW COMPARISON:  CT angio chest 08/15/2021, chest x-ray 08/15/2021 FINDINGS: The heart and mediastinal contours are unchanged. Vascular stent overlies the mediastinum again  noted in consistent with known aortic arch branch stent. Atherosclerotic plaque. No focal consolidation. Redemonstration of chronic coarsened interstitial markings/reticular opacities within the right apex. Persistent left apical pleural/pulmonary scarring. No pleural effusion. No pneumothorax. No acute osseous abnormality. Likely calcific tendinitis along the right shoulder. IMPRESSION: No active disease. Electronically Signed   By: Iven Finn M.D.   On: 08/30/2021 15:38    Labs on Admission: I have personally reviewed following labs  CBC: Recent Labs  Lab 09/13/2021 1413  WBC 15.5*  NEUTROABS 13.1*  HGB 10.8*  HCT 33.6*  MCV 113.9*  PLT [id number]   Basic Metabolic Panel: Recent Labs  Lab 09/02/2021 1652 09/13/2021 1718  NA  --  139  K  --  4.0  CL  --  115*  CO2  --  8*  GLUCOSE  --  45*  BUN  --  18  CREATININE 1.20* 1.32*  CALCIUM  --  6.6*  MG  --  1.6*   GFR: Estimated Creatinine Clearance: 28.5 mL/min (A) (by C-G formula based on SCr of 1.32 mg/dL (H)).  Liver Function Tests: Recent Labs  Lab 09/17/2021  1718  AST 489*  ALT 98*  ALKPHOS 77  BILITOT 0.9  PROT 3.2*  ALBUMIN <1.5*   Coagulation Profile: Recent Labs  Lab 09/12/2021 1413  INR 3.7*   Thyroid Function Tests: Recent Labs    09/10/2021 1718  TSH 6.156*   Urine analysis:    Component Value Date/Time   COLORURINE AMBER (A) 09/19/2021 1413   APPEARANCEUR CLEAR 09/02/2021 1413   LABSPEC 1.025 08/29/2021 1413   PHURINE 5.5 09/07/2021 1413   GLUCOSEU NEGATIVE 08/26/2021 1413   HGBUR LARGE (A) 09/14/2021 1413   BILIRUBINUR LARGE (A) 09/01/2021 1413   KETONESUR 15 (A) 08/23/2021 1413   PROTEINUR 100 (A) 09/04/2021 1413   NITRITE NEGATIVE 08/31/2021 1413   LEUKOCYTESUR NEGATIVE 09/01/2021 1413   Dr. Tobie Poet [facility]  If 7PM-7AM, please contact overnight-coverage provider If 7AM-7PM, please contact day coverage provider [email or web address]  09/09/2021, 10:19 PM

## 2021-09-20 NOTE — Progress Notes (Signed)
Notified bedside nurse of need to draw repeat lactic acid. 

## 2021-09-20 NOTE — ED Provider Notes (Addendum)
[facility] Provider Note    Event Date/Time   First MD Initiated Contact with Patient 09/19/2021 1636     (approximate)   History   Weakness   HPI  Margaret Castillo is a 87 y.o. female who came into the emergency department by EMS.  Apparently she vomited once a couple days ago and then has been having slowly declining mental status till today the nursing facility sent her here because she was not acting normal.  On arrival here she was hypotensive with a blood pressure which I thought was 88 systolic.  Her O2 sats were low.  Her rectal temperature was 89 degrees.  Patient was not eating or drinking much for the last few days.  Patient's grandson comes in while I am working on the patient.  Later patient's granddaughter comes in.  Both of them confirm the history above.  Patient is no code but as I understand from the granddaughter they want me to do everything possible except for coding her.  On examination she was awake but confused moving all of her extremities.       Physical Exam   Triage Vital Signs: ED Triage Vitals  Enc Vitals Group     BP 08/26/2021 1415 (!) 79/68     Pulse Rate 09/08/2021 1545 (!) 25     Resp 09/19/2021 1415 (!) 23     Temp 08/30/2021 1411 (!) 89 F (31.7 C)     Temp Source 09/05/2021 1411 Rectal     SpO2 08/23/2021 1545 92 %     Weight 08/31/2021 1411 125 lb 7.1 oz (56.9 kg)     Height 09/10/2021 1411 5\' 5"  (1.651 m)     Head Circumference --      Peak Flow --      Pain Score 08/29/2021 1411 0     Pain Loc --      Pain Edu? --      Excl. in GC? --     Most recent vital signs: Vitals:   09/13/2021 1512 09/14/2021 1545  BP: (!) 38/29 (!) 122/27  Pulse:  (!) 25  Resp: 19 15  Temp:    SpO2:  92%     General: Awake, groggy and confused Head normocephalic atraumatic Eyes pupils equal round reactive extraocular movements intact CV:  Patient is mottled with capillary refill over 5 seconds.  Heart regular rate and rhythm Resp:  Increased  respiratory effort but no retractions I do not hear a lot of air movement and certainly no abnormal air movement sounds Abd:  No distention.  Bowel sounds are quiet abdomen feels firm if I palpate hard the patient moans and tries to move away Extremities no edema   ED Results / Procedures / Treatments   Labs (all labs ordered are listed, but only abnormal results are displayed) Labs Reviewed  LACTIC ACID, PLASMA - Abnormal; Notable for the following components:      Result Value   Lactic Acid, Venous >9.0 (*)    All other components within normal limits  CBC WITH DIFFERENTIAL/PLATELET - Abnormal; Notable for the following components:   WBC 15.5 (*)    RBC 2.95 (*)    Hemoglobin 10.8 (*)    HCT 33.6 (*)    MCV 113.9 (*)    MCH 36.6 (*)    RDW 18.4 (*)    nRBC 3.0 (*)    Neutro Abs 13.1 (*)    Abs Immature Granulocytes 0.12 (*)  All other components within normal limits  URINALYSIS, ROUTINE W REFLEX MICROSCOPIC - Abnormal; Notable for the following components:   Color, Urine AMBER (*)    Hgb urine dipstick LARGE (*)    Bilirubin Urine LARGE (*)    Ketones, ur 15 (*)    Protein, ur 100 (*)    RBC / HPF >50 (*)    Bacteria, UA RARE (*)    All other components within normal limits  APTT - Abnormal; Notable for the following components:   aPTT 52 (*)    All other components within normal limits  PROTIME-INR - Abnormal; Notable for the following components:   Prothrombin Time 36.4 (*)    INR 3.7 (*)    All other components within normal limits  RESP PANEL BY RT-PCR (FLU A&B, COVID) ARPGX2  CULTURE, BLOOD (ROUTINE X 2)  CULTURE, BLOOD (ROUTINE X 2)  URINE CULTURE  PROCALCITONIN  LACTIC ACID, PLASMA  MAGNESIUM  TSH  COMPREHENSIVE METABOLIC PANEL  TROPONIN I (HIGH SENSITIVITY)  TROPONIN I (HIGH SENSITIVITY)     EKG     RADIOLOGY Chest x-ray reviewed by me shows central line in place.  It does not appear to go too close to the heart or cross into the other side of  the chest.  I do not see any pneumothorax or pleural effusion.  Patient may have a slight infiltrate in the right upper lobe.   PROCEDURES:  Critical Care performed: Critical care performed 45 minutes completely spent at the bedside.  I attempted to put an EJ in the patient for access twice but was unable to do so because her veins were very mobile.  Nurse was able to get an ultrasound guided IV but this then blew.  I then put in a central line with ultrasound guidance in the left side clavian artery.  This was done without trouble line flushed easily and blood was withdrawn easily.  I sent blood off from this line for lab testing.  The patient had been placed on BiPAP by me shortly after arrival for her low sats.  We put the bear hugger on her quickly when her temperature was found to be low and sepsis orders were placed for me by the nurse under my direction. I spoke with both the grandson and the granddaughter.  Fluid boluses were given and nor epi drip was started.  Antibiotics were chosen for sepsis for unknown source.  I planned on CT the patient's abdomen at least and chest x-ray to be done after line placement.  I have signed the patient out to Dr. Hulan Saas.  I also discussed the patient briefly with Dr. Mortimer Fries, ICU.  Dr. Tamala Julian later gave the full report. Procedures   MEDICATIONS ORDERED IN ED: Medications  norepinephrine (LEVOPHED) 4mg  in 270mL (0.016 mg/mL) premix infusion (5 mcg/min Intravenous Rate/Dose Change 09/06/2021 1537)  vancomycin (VANCOREADY) IVPB 1250 mg/250 mL (has no administration in time range)  lactated ringers infusion (has no administration in time range)  sodium chloride 0.9 % bolus 1,000 mL (has no administration in time range)  lactated ringers bolus 500 mL (has no administration in time range)  sodium chloride 0.9 % bolus 1,000 mL (1,000 mLs Intravenous New Bag/Given 09/02/2021 1515)  ceFEPIme (MAXIPIME) 2 g in sodium chloride 0.9 % 100 mL IVPB (0 g Intravenous Stopped  09/19/2021 1603)  metroNIDAZOLE (FLAGYL) IVPB 500 mg (500 mg Intravenous New Bag/Given 08/26/2021 1526)  iohexol (OMNIPAQUE) 300 MG/ML solution 75 mL (75 mLs Intravenous Contrast  Given 08/29/2021 1647)     IMPRESSION / MDM / ASSESSMENT AND PLAN / ED COURSE  I reviewed the triage vital signs and the nursing notes. Patient apparently with sepsis due to some intra-abdominal catastrophe.  Central line inserted by me using ultrasound guidance.  Patient tolerated well. On reviewing the labs apparently the CMP was not ordered even though the sepsis orders were reportedly put in.  This has been rectified by the oncoming physician Differential diagnosis includes, but is not limited to, colitis, other intra-abdominal infection, leaking aortic repair, other causes of intra-abdominal sepsis.   The patient is on the cardiac monitor to evaluate for evidence of arrhythmia and/or significant heart rate changes.  None were seen. I discussed with the patient's granddaughter the fact that the patient is extremely ill and could quite possibly die today.  Patient's daughter is on the way from [place].  Yolanda Bonine is already here.     FINAL CLINICAL IMPRESSION(S) / ED DIAGNOSES   Final diagnoses:  Hypotension, unspecified hypotension type  Hypothermia, initial encounter  Hypoxia  Sepsis, due to unspecified organism, unspecified whether acute organ dysfunction present [facility])     Rx / DC Orders   ED Discharge Orders     None        Note:  This document was prepared using Dragon voice recognition software and may include unintentional dictation errors.   Nena Polio, MD 09/02/2021 1659 I should add an additional lactic acid came back at 9 procalcitonin was 0.4+.  White count was elevated.  Electrolytes have not come back at this point.  Discussed with Dr. Tamala Julian we will get a CT of the chest abdomen pelvis and head to look for source of infection and/or mental status change.   Nena Polio,  MD 09/14/2021 [phone]

## 2021-09-20 NOTE — Consult Note (Signed)
CHIEF COMPLAINT:   Chief Complaint  Patient presents with   Weakness    Subjective  Patient seen and evaluated for acute multiorgan failure with SBO with severe acidosis Patient with severe encephalopathy and severe resp distress Presents from NH with hypotension, hypoxia and hypothermia  Patient ripping biPAP apart moaning and screaming in ABD pain   REVIEW OF SYSTEMS  PATIENT IS UNABLE TO PROVIDE COMPLETE REVIEW OF SYSTEMS DUE TO SEVERE CRITICAL ILLNESS AND TOXIC METABOLIC ENCEPHALOPATHY    PHYSICAL EXAMINATION:  GENERAL:critically ill appearing, +resp distress EYES: Pupils equal, round, reactive to light.  No scleral icterus.  MOUTH: Moist mucosal membrane.  NECK: Supple.  PULMONARY: +rhonchi, +wheezing CARDIOVASCULAR: S1 and S2.  No murmurs  GASTROINTESTINAL: Soft, nontender, -distended. Positive bowel sounds.  MUSCULOSKELETAL: No swelling, clubbing, or edema.  NEUROLOGIC: obtunded SKIN:intact,warm,dry   VITALS:  height is 5\' 5"  (1.651 m) and weight is 56.9 kg. Her rectal temperature is 89 F (31.7 C) (abnormal). Her blood pressure is 104/34 (abnormal) and her pulse is 72. Her respiration is 20 and oxygen saturation is 100%.   I personally reviewed Labs under Results section.  Radiology Reports CT HEAD WO CONTRAST (5MM)  Result Date: 08/22/2021 CLINICAL DATA:  Mental status change, unknown cause EXAM: CT HEAD WITHOUT CONTRAST TECHNIQUE: Contiguous axial images were obtained from the base of the skull through the vertex without intravenous contrast. RADIATION DOSE REDUCTION: This exam was performed according to the departmental dose-optimization program which includes automated exposure control, adjustment of the mA and/or kV according to patient size and/or use of iterative reconstruction technique. COMPARISON:  None. BRAIN: BRAIN Cerebral ventricle sizes are concordant with the degree of cerebral volume loss. Patchy and confluent areas of decreased attenuation are  noted throughout the deep and periventricular white matter of the cerebral hemispheres bilaterally, compatible with chronic microvascular ischemic disease. No evidence of large-territorial acute infarction. No parenchymal hemorrhage. No mass lesion. No extra-axial collection. No mass effect or midline shift. No hydrocephalus. Basilar cisterns are patent. Vascular: No hyperdense vessel. Atherosclerotic calcifications are present within the cavernous internal carotid arteries. Skull: No acute fracture or focal lesion. Hyperostosis frontalis interna. Sinuses/Orbits: Paranasal sinuses and mastoid air cells are clear. The orbits are unremarkable. Other: None. IMPRESSION: No acute intracranial abnormality In a patient with diffuse cerebral and cerebellar atrophy as well as chronic microvascular ischemic changes. Electronically Signed   By: Iven Finn M.D.   On: 09/01/2021 17:16   CT CHEST ABDOMEN PELVIS W CONTRAST  Result Date: 09/04/2021 CLINICAL DATA:  Evaluate for sepsis. Patient presents with weakness and unresponsiveness. EXAM: CT CHEST, ABDOMEN, AND PELVIS WITH CONTRAST TECHNIQUE: Multidetector CT imaging of the chest, abdomen and pelvis was performed following the standard protocol during bolus administration of intravenous contrast. RADIATION DOSE REDUCTION: This exam was performed according to the departmental dose-optimization program which includes automated exposure control, adjustment of the mA and/or kV according to patient size and/or use of iterative reconstruction technique. CONTRAST:  20mL OMNIPAQUE IOHEXOL 300 MG/ML  SOLN COMPARISON:  08/15/2021 FINDINGS: CT CHEST FINDINGS Cardiovascular: Heart size appears within normal limits. Aortic atherosclerosis with coronary artery calcifications. No pericardial effusion. Mediastinum/Nodes: No discrete thyroid nodule. Trachea appears patent and is midline. Dilated esophagus appears fluid-filled, which may reflect underlying esophageal dysmotility. No  enlarged axillary, supraclavicular, mediastinal, or hilar lymph nodes. Lungs/Pleura: No pleural effusion. No atelectasis, airspace consolidation or pneumothorax. Right apical interstitial reticulation is identified with chronic nodular architectural distortion measuring 1.9 cm. No acute airspace disease identified. Musculoskeletal:  No acute osseous findings. Chronic bilateral rib fracture deformities are again noted and appear similar to the previous exam. Vertebra plana deformity is noted involving the T12 vertebral body, unchanged. CT ABDOMEN PELVIS FINDINGS Hepatobiliary: Hepatic steatosis. Previous cholecystectomy. No signs of bile duct dilatation. Pancreas: Unremarkable. No pancreatic ductal dilatation or surrounding inflammatory changes. Spleen: Normal in size without focal abnormality. Adrenals/Urinary Tract: Normal appearance of the adrenal glands. New striated nephrograms identified involving the upper pole of the left kidney, image 61/3 and image 64/3. Unchanged appearance of asymmetric cortical atrophy involving the inferior pole of the right kidney, image 19/8. Bilateral renal calcifications are noted which may be vascular in etiology. No signs of obstructive uropathy. No hydroureter or ureteral lithiasis. Small stone is noted within the right posterior bladder measuring 2 mm, image 107/3 Stomach/Bowel: Stomach appears normal. No bowel wall thickening, or inflammation. The proximal and mid small bowel loops are mildly increased in caliber with air-fluid levels. These measure up to 2.7 cm.Distal small bowel loops are decreased to normal in caliber. The colon appears decompressed up to the level of the sigmoid colon. Moderate stool burden identified within the rectum which may reflect underlying constipation. Vascular/Lymphatic: Again seen is a large infrarenal abdominal aortic aneurysm. The aneurysm sac measures 7.9 x 5.8 cm, image 69/3. Previous endovascular stent graft repair noted. As noted previously  linear, tubular appearing contrast filled structures in the proximally aneurysm sac are again noted, image 68/3. No signs abdominopelvic adenopathy. Reproductive: Uterus and bilateral adnexa are unremarkable. Other: No free fluid or fluid collection. Musculoskeletal: No acute or significant osseous findings. IMPRESSION: 1. The proximal and mid small bowel loops are mildly increased in caliber with air-fluid levels. Distal small bowel loops are decreased to normal in caliber. Findings may reflect low-grade partial obstruction versus ileus secondary to enteritis. 2. Similar appearance of large infrarenal abdominal aortic aneurysm status post endovascular stent graft repair. The aneurysm sac measures up to 7.9 cm (previously this was measured at 8.1 cm). Unchanged appearance of linear, tubular appearing contrast filled structures in the proximally aneurysm sac which may represent a endoleak. 3. Left kidney striated nephrograms, new from previous exam. This may reflect acute pyelonephritis versus sequelae of embolic disease. 4. Hepatic steatosis. 5. Chronic bilateral rib fracture deformities. 6. Aortic Atherosclerosis (ICD10-I70.0) and Emphysema (ICD10-J43.9). Electronically Signed   By: Kerby Moors M.D.   On: 09/13/2021 17:44   DG Chest Port 1 View  Result Date: 08/27/2021 CLINICAL DATA:  Suspected Sepsis EXAM: PORTABLE CHEST 1 VIEW COMPARISON:  CT angio chest 08/15/2021, chest x-ray 08/15/2021 FINDINGS: The heart and mediastinal contours are unchanged. Vascular stent overlies the mediastinum again noted in consistent with known aortic arch branch stent. Atherosclerotic plaque. No focal consolidation. Redemonstration of chronic coarsened interstitial markings/reticular opacities within the right apex. Persistent left apical pleural/pulmonary scarring. No pleural effusion. No pneumothorax. No acute osseous abnormality. Likely calcific tendinitis along the right shoulder. IMPRESSION: No active disease.  Electronically Signed   By: Iven Finn M.D.   On: 09/18/2021 15:38       Assessment/Plan:  87 yo white female with severe multiorgan failure with SBO with probable bowel ischemia with rapid progressive multiorgan failure  Patient is suffering and dying process  PATIENT IS DNR/DNI        Critical Care Time devoted to patient care services described in this note is 65 minutes.  Critical care was necessary to treat /prevent imminent and life-threatening deterioration. Overall, patient is critically ill, prognosis is guarded.  Patient  with Multiorgan failure and at high risk for cardiac arrest and death.    Corrin Parker, M.D.  [facility]  Medical Director [place] Director [facility] Cardio-Pulmonary Department

## 2021-09-20 NOTE — Assessment & Plan Note (Signed)
-   Likely multifactorial in source including aspiration pneumonia into the right lower lobe, pyelonephritis

## 2021-09-20 NOTE — ED Notes (Signed)
Bed transport requested

## 2021-09-20 NOTE — Progress Notes (Signed)
Notified provider and bedside nurse of need to order and administer fluid bolus, pt needs 1707 cc bedside RN states 3 liters given but no documentation of this. MD canceled other 1500 cc that he ordered. Marland Kitchen

## 2021-09-20 NOTE — Consult Note (Signed)
CODE SEPSIS - PHARMACY COMMUNICATION  **Broad Spectrum Antibiotics should be administered within 1 hour of Sepsis diagnosis**  Time Code Sepsis Called/Page Received: 1503  Antibiotics Ordered: 1504  Time of 1st antibiotic administration: 1516      Sharen Hones ,PharmD, BCPS Clinical Pharmacist  10-19-2021  3:14 PM

## 2021-09-20 NOTE — Progress Notes (Signed)
GOALS OF CARE DISCUSSION  The Clinical status was relayed to family in detail. ALL family at bedside Updated and notified of patients medical condition.    Patient remains unresponsive and will not open eyes to command.   Patient is having a weak cough and struggling to remove secretions.   Patient with increased WOB and using accessory muscles to breathe Explained to family course of therapy and the modalities    Patient with Progressive multiorgan failure with a very high probablity of a very minimal chance of meaningful recovery despite all aggressive and optimal medical therapy.    Family understands the situation.  They have consented and agreed to DNR/DNI and would like to proceed with Comfort care measures.  Patient is suffering and dying process and family has agreed that patient would NOT want to suffer any more.  Comfort Care Orders placed  Family are satisfied with Plan of action and management. All questions answered Case discussed with ER Dr. Katrinka Blazing  Additional CC time 45 mins   Yasuko Lapage Santiago Glad, M.D.  [facility]  Medical Director [facility] Medical Director [facility] Cardio-Pulmonary Department

## 2021-09-20 NOTE — Progress Notes (Signed)
Elink following code sepsis °

## 2021-09-20 NOTE — Progress Notes (Signed)
Blood cultures drawn before antibiotics started 

## 2021-09-20 NOTE — Progress Notes (Signed)
Bedside RN states 2 more liters  fluid given

## 2021-09-20 NOTE — Progress Notes (Signed)
Notified bedside nurse of need to administer fluid bolus.  

## 2021-09-20 NOTE — ED Notes (Signed)
Pt made comfort care at this time. Providers remain at bedside. All infusions stopped at this time. Comfort provided to family. Chaplain consulted.

## 2021-09-20 NOTE — ED Notes (Signed)
Chaplain at bedside at this time.  

## 2021-09-20 NOTE — Hospital Course (Addendum)
[name] is an 87 year old female with medical history of abdominal aortic aneurysm status post endoleak coiling embolization, stenosis of the left subclavian artery, history of bilateral carotid artery stenosis, remote history of tobacco use, history of gastroenteritis, history of COVID-19 infection, history of GI bleeding, history of internal hemorrhoids with prolapse, hypothyroid, history of chronic anticoagulation use, insomnia, failure to thrive, constipation, hyperlipidemia, who presents emergency department from [facility] facility for chief concerns of vomiting and unresponsiveness.  Initial vitals in the emergency department showed temperature of 92.4, respiration rate of 23, heart rate of 86, initial blood pressure 79/68 and decreased to 48/20, SPO2 of 64% on 8 L nasal cannula.  A VBG in the emergency department showed 7.03/14/55.   Serum sodium 139, potassium 4.0, chloride 115, bicarb of 8, nonfasting blood glucose 45, BUN of 18, serum creatinine of 1.32, lactic acid greater than 9x2, procalcitonin was elevated at 0.48, white cell count was markedly elevated at 15.5, hemoglobin 10.8, platelets of 277.  TSH of 6.156.  COVID/influenza A/influenza B PCR were negative.  UA was positive for hemoglobin large, large amounts of bilirubin, and negative for nitrates and leukocytes.  Imaging in the emergency department included CT of the head without contrast which was read as no acute intracranial abnormality in a patient with diffuse cerebral and cerebellar atrophy as well as chronic microvascular ischemic changes.  CT of the chest abdomen pelvis with contrast was read as proximal and mid small bowel loops mildly increased in caliber with air-fluid levels.  Distal small bowel loops are decreased to normal caliber.  May reflect low-grade partial obstruction versus ileus secondary to enteritis.  Similar appearance of large infrarenal abdominal aortic aneurysm status post endovascular  stent graft repair.  Left kidney striated nephrograms, may reflect acute pyelonephritis versus sequelae of embolic disease.  Hepatic steatosis.  Bilateral rib fracture deformities.  Patient was initially treated with cefepime 2 g IV, metronidazole 500 mg IV once, vancomycin IV, sepsis bolus with a total of 4 L sodium chloride.  Patient continued to deteriorate.  EDP discussed with family regarding poor prognosis.  Family was agreeable to comfort measures.  Patient was started on comfort care measures in the emergency department.

## 2021-09-20 NOTE — Progress Notes (Signed)
Notified provider of need to order fluid bolus Pt needs 1707 cc.

## 2021-09-20 NOTE — Progress Notes (Signed)
I responded to a page from the nurse to provide spiritual care and support for the patient's family. I arrived at the patient's room where her son, Jenny Reichmann, was present. I provided spiritual care through pastoral presence, by sharing words of comfort, by reading scripture, and leading in prayer.     09/20/2021 1900  Clinical Encounter Type  Visited With Patient and family together  Visit Type Initial;Spiritual support  Referral From Nurse  Consult/Referral To Chaplain  Spiritual Encounters  Spiritual Needs Prayer;Emotional     Chaplain Dr Redgie Grayer

## 2021-09-20 NOTE — ED Notes (Signed)
ED Provider at bedside. 

## 2021-09-20 NOTE — ED Notes (Signed)
Pt to CT via stretcher, bipap continues, with this nurse and RT

## 2021-09-20 NOTE — ED Notes (Signed)
No further vitals taken at this time to allow for serenity during end of life transition.

## 2021-09-20 NOTE — ED Notes (Signed)
Called for bereavement tray from dietary

## 2021-09-20 NOTE — ED Notes (Signed)
Provider at bedside with family at this time to discuss goals of care

## 2021-09-20 NOTE — ED Triage Notes (Signed)
Pt here via [facility] with weakness and unresponsive. Pt normally talks but has not today. Pt was recently seen on Sat. BP in the 60s with ems. Pt pale with taut and tender abd. Pt cold to touch.  125-cbg 73 HR

## 2021-09-21 LAB — URINE CULTURE: Culture: <10000 — AB

## 2021-09-22 LAB — BLOOD CULTURE ID PANEL (REFLEXED) - BCID2

## 2021-09-22 NOTE — ED Provider Notes (Signed)
3:48 AM  I was asked by patient's nurse to come evaluate with ultrasound to see if there is any cardiac activity.  Patient was admitted to the hospitalist service for comfort care and was a DNR/DNI and family present at bedside.  Nurse reports that she did not detect a pulse or any spontaneous respirations around 3:35 AM.  I confirmed using bedside ultrasound that there was no cardiac activity.  Family and nurse updated that patient is deceased.   Aleera Gilcrease, Layla Maw, DO 09/28/21 [phone]

## 2021-09-22 NOTE — ED Notes (Signed)
Transfer to [facility] requested

## 2021-09-22 NOTE — Significant Event (Signed)
° ° °      CROSS COVER NOTE  NAME: DONIESHA LANDAU MRN: [id number] DOB : 05/05/1935   Notified by RN that patient has expired at 0335  Patient was comfort care/DNR  Family was at bedside and available to RN.  Bishop Limbo MHA, MSN, FNP-BC Nurse Practitioner [facility] Pager [phone]

## 2021-09-22 NOTE — ED Notes (Signed)
TOD was 0335, Dr Elesa Massed to bedside to confirm no cardiac activity by [place]. Provider made aware.

## 2021-09-22 NOTE — ED Notes (Signed)
All drains, tube removed from pt. Pt placed in white body bag and transport called for morgue transport.

## 2021-09-22 DEATH — deceased

## 2021-09-23 LAB — CULTURE, BLOOD (ROUTINE X 2): Special Requests: ADEQUATE

## 2021-09-25 LAB — CULTURE, BLOOD (ROUTINE X 2)
Culture: NO GROWTH
Special Requests: ADEQUATE

## 2021-10-20 NOTE — Progress Notes (Signed)
09/28/21 Was notified by Viviano Simas RN from ED, patient's daughter in law, [place] [facility]([phone]) had called about the death certificate. I had spoken with Luan Pulling and Janee Morn yesterday(October 12, 2021) about the death certificate. Monmouth Sink stated the wrong provider was on the form for MD to sign.Gave her correct provider. Valinda Sink stated she had spoken with the hospitalist office and the MD would sign the death certificate today. Called Melody back and explained we had given the incorrect information initially, then updated the information yesterday and it should be signed by the MD today. Apologized profusely for the delay. Mrs. Murry was pleasant and thanked me for calling her back.
